# Patient Record
Sex: Male | Born: 1950 | Race: White | Hispanic: No | Marital: Single | Smoking: Former smoker
Health system: Southern US, Community
[De-identification: ages and names within clinical notes are randomized; demographics above are authoritative.]

## PROBLEM LIST (undated history)

## (undated) DIAGNOSIS — M4800 Spinal stenosis, site unspecified: Secondary | ICD-10-CM

## (undated) DIAGNOSIS — G629 Polyneuropathy, unspecified: Secondary | ICD-10-CM

## (undated) DIAGNOSIS — D696 Thrombocytopenia, unspecified: Secondary | ICD-10-CM

## (undated) DIAGNOSIS — G600 Hereditary motor and sensory neuropathy: Secondary | ICD-10-CM

## (undated) DIAGNOSIS — H9190 Unspecified hearing loss, unspecified ear: Secondary | ICD-10-CM

## (undated) DIAGNOSIS — M1990 Unspecified osteoarthritis, unspecified site: Secondary | ICD-10-CM

## (undated) DIAGNOSIS — N400 Enlarged prostate without lower urinary tract symptoms: Secondary | ICD-10-CM

## (undated) DIAGNOSIS — M21371 Foot drop, right foot: Secondary | ICD-10-CM

## (undated) DIAGNOSIS — I639 Cerebral infarction, unspecified: Secondary | ICD-10-CM

## (undated) HISTORY — DX: Unspecified hearing loss, unspecified ear: H91.90

## (undated) HISTORY — PX: FOOT ARTHROPLASTY: SHX1657

## (undated) HISTORY — PX: KNEE ARTHROSCOPY: SUR90

## (undated) HISTORY — PX: CERVICAL DISC SURGERY: SHX588

## (undated) HISTORY — DX: Cerebral infarction, unspecified: I63.9

## (undated) HISTORY — DX: Spinal stenosis, site unspecified: M48.00

## (undated) HISTORY — DX: Hereditary motor and sensory neuropathy: G60.0

---

## 2014-03-27 ENCOUNTER — Telehealth: Payer: Self-pay | Admitting: Family Medicine

## 2014-03-27 NOTE — Telephone Encounter (Signed)
Patient currently has [place] and is taking tamsulosin, diclofenac, and tizanadine. Patient just moved here from [place]. Patient was scheduled for 3/2 with [facility]. Patient notified to bring ins card, photo id, and medications.

## 2014-04-11 ENCOUNTER — Encounter (INDEPENDENT_AMBULATORY_CARE_PROVIDER_SITE_OTHER): Payer: Self-pay

## 2014-04-11 ENCOUNTER — Telehealth: Payer: Self-pay | Admitting: Family Medicine

## 2014-04-11 ENCOUNTER — Encounter: Payer: Self-pay | Admitting: Family Medicine

## 2014-04-11 ENCOUNTER — Ambulatory Visit (INDEPENDENT_AMBULATORY_CARE_PROVIDER_SITE_OTHER): Payer: 59 | Admitting: Family Medicine

## 2014-04-11 VITALS — BP 149/79 | HR 54 | Temp 96.6°F | Ht 69.0 in | Wt 184.0 lb

## 2014-04-11 DIAGNOSIS — G609 Hereditary and idiopathic neuropathy, unspecified: Secondary | ICD-10-CM

## 2014-04-11 DIAGNOSIS — M1990 Unspecified osteoarthritis, unspecified site: Secondary | ICD-10-CM | POA: Diagnosis not present

## 2014-04-11 DIAGNOSIS — R42 Dizziness and giddiness: Secondary | ICD-10-CM | POA: Diagnosis not present

## 2014-04-11 DIAGNOSIS — N400 Enlarged prostate without lower urinary tract symptoms: Secondary | ICD-10-CM

## 2014-04-11 MED ORDER — DICLOFENAC SODIUM 75 MG PO TBEC: 75.0000 mg | DELAYED_RELEASE_TABLET | Freq: Two times a day (BID) | ORAL | Status: DC

## 2014-04-11 MED ORDER — CYCLOBENZAPRINE HCL 10 MG PO TABS: 10.0000 mg | ORAL_TABLET | Freq: Every day | ORAL | Status: DC

## 2014-04-11 MED ORDER — TAMSULOSIN HCL 0.4 MG PO CAPS: 0.4000 mg | ORAL_CAPSULE | Freq: Every day | ORAL | Status: DC

## 2014-04-11 NOTE — Telephone Encounter (Signed)
Diclofenac did not transmit to [facility] - I called this in and pt aware

## 2014-04-11 NOTE — Patient Instructions (Signed)
DASH Eating Plan °DASH stands for "Dietary Approaches to Stop Hypertension." The DASH eating plan is a healthy eating plan that has been shown to reduce high blood pressure (hypertension). Additional health benefits may include reducing the risk of type 2 diabetes mellitus, heart disease, and stroke. The DASH eating plan may also help with weight loss. °WHAT DO I NEED TO KNOW ABOUT THE DASH EATING PLAN? °For the DASH eating plan, you will follow these general guidelines: °· Choose foods with a percent daily value for sodium of less than 5% (as listed on the food label). °· Use salt-free seasonings or herbs instead of table salt or sea salt. °· Check with your health care provider or pharmacist before using salt substitutes. °· Eat lower-sodium products, often labeled as "lower sodium" or "no salt added." °· Eat fresh foods. °· Eat more vegetables, fruits, and low-fat dairy products. °· Choose whole grains. Look for the word "whole" as the first word in the ingredient list. °· Choose fish and skinless chicken or turkey more often than red meat. Limit fish, poultry, and meat to 6 oz (170 g) each day. °· Limit sweets, desserts, sugars, and sugary drinks. °· Choose heart-healthy fats. °· Limit cheese to 1 oz (28 g) per day. °· Eat more home-cooked food and less restaurant, buffet, and fast food. °· Limit fried foods. °· Cook foods using methods other than frying. °· Limit canned vegetables. If you do use them, rinse them well to decrease the sodium. °· When eating at a restaurant, ask that your food be prepared with less salt, or no salt if possible. °WHAT FOODS CAN I EAT? °Seek help from a dietitian for individual calorie needs. °Grains °Whole grain or whole wheat bread. Brown rice. Whole grain or whole wheat pasta. Quinoa, bulgur, and whole grain cereals. Low-sodium cereals. Corn or whole wheat flour tortillas. Whole grain cornbread. Whole grain crackers. Low-sodium crackers. °Vegetables °Fresh or frozen vegetables  (raw, steamed, roasted, or grilled). Low-sodium or reduced-sodium tomato and vegetable juices. Low-sodium or reduced-sodium tomato sauce and paste. Low-sodium or reduced-sodium canned vegetables.  °Fruits °All fresh, canned (in natural juice), or frozen fruits. °Meat and Other Protein Products °Ground beef (85% or leaner), grass-fed beef, or beef trimmed of fat. Skinless chicken or turkey. Ground chicken or turkey. Pork trimmed of fat. All fish and seafood. Eggs. Dried beans, peas, or lentils. Unsalted nuts and seeds. Unsalted canned beans. °Dairy °Low-fat dairy products, such as skim or 1% milk, 2% or reduced-fat cheeses, low-fat ricotta or cottage cheese, or plain low-fat yogurt. Low-sodium or reduced-sodium cheeses. °Fats and Oils °Tub margarines without trans fats. Light or reduced-fat mayonnaise and salad dressings (reduced sodium). Avocado. Safflower, olive, or canola oils. Natural peanut or almond butter. °Other °Unsalted popcorn and pretzels. °The items listed above may not be a complete list of recommended foods or beverages. Contact your dietitian for more options. °WHAT FOODS ARE NOT RECOMMENDED? °Grains °White bread. White pasta. White rice. Refined cornbread. Bagels and croissants. Crackers that contain trans fat. °Vegetables °Creamed or fried vegetables. Vegetables in a cheese sauce. Regular canned vegetables. Regular canned tomato sauce and paste. Regular tomato and vegetable juices. °Fruits °Dried fruits. Canned fruit in light or heavy syrup. Fruit juice. °Meat and Other Protein Products °Fatty cuts of meat. Ribs, chicken wings, bacon, sausage, bologna, salami, chitterlings, fatback, hot dogs, bratwurst, and packaged luncheon meats. Salted nuts and seeds. Canned beans with salt. °Dairy °Whole or 2% milk, cream, half-and-half, and cream cheese. Whole-fat or sweetened yogurt. Full-fat   cheeses or blue cheese. Nondairy creamers and whipped toppings. Processed cheese, cheese spreads, or cheese  curds. °Condiments °Onion and garlic salt, seasoned salt, table salt, and sea salt. Canned and packaged gravies. Worcestershire sauce. Tartar sauce. Barbecue sauce. Teriyaki sauce. Soy sauce, including reduced sodium. Steak sauce. Fish sauce. Oyster sauce. Cocktail sauce. Horseradish. Ketchup and mustard. Meat flavorings and tenderizers. Bouillon cubes. Hot sauce. Tabasco sauce. Marinades. Taco seasonings. Relishes. °Fats and Oils °Butter, stick margarine, lard, shortening, ghee, and bacon fat. Coconut, palm kernel, or palm oils. Regular salad dressings. °Other °Pickles and olives. Salted popcorn and pretzels. °The items listed above may not be a complete list of foods and beverages to avoid. Contact your dietitian for more information. °WHERE CAN I FIND MORE INFORMATION? °National Heart, Lung, and [employer] Institute: www.nhlbi.nih.gov/health/health-topics/topics/dash/ °Document Released: 01/15/2011 Document Revised: 06/12/2013 Document Reviewed: 11/30/2012 °ExitCare® Patient Information ©2015 [employer]. This information is not intended to replace advice given to you by your health care provider. Make sure you discuss any questions you have with your health care provider. ° °

## 2014-04-11 NOTE — Progress Notes (Signed)
Subjective:  Patient ID: Treyden Hakim, male    DOB: April 20, 1950  Age: 64 y.o. MRN: [id number]  CC: Establish Care   HPI Maron Stanzione presents for *Mr. Coward is a new patient today who recently moved from [place] by way of [place]. 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 He had worked there for 40 years when the close down a year ago. He's been in [place] for about 2 months. He has a history of prostate problems for which he takes  tamsulosin. He has a problem with his right knee for which he takes the diclofenac. He has some other joint issues as well and stiffness in the mornings. For this he takes the tizanidine. At some point he's developed just a feeling of lightheadedness this persisted through much of the day. It is not associated with vertigo. It is mild enough that he is able to function but it is somewhat disorienting at times. It does not seem to occur when he's driving or when he is outdoors and active. It seems to be more common when he seated and when he is indoors.  History Edwen has no past medical history on file.   He has no past surgical history on file.   His family history is not on file.He reports that he quit smoking about 20 years ago. His smoking use included Cigarettes. He started smoking about 25 years ago. He smoked 0.50 packs per day. He does not have any smokeless tobacco history on file. He reports that he does not drink alcohol or use illicit drugs.  No current outpatient prescriptions on file prior to visit.   No current facility-administered medications on file prior to visit.    ROS Review of Systems  Constitutional: Negative for fever, chills, diaphoresis and unexpected weight change.  HENT: Negative for congestion, hearing loss, rhinorrhea, sore throat and trouble swallowing.   Respiratory: Negative for cough, chest tightness, shortness of  breath and wheezing.   Gastrointestinal: Negative for nausea, vomiting, abdominal pain, diarrhea, constipation and abdominal distention.  Endocrine: Negative for cold intolerance and heat intolerance.  Genitourinary: Positive for frequency. Negative for dysuria, urgency, hematuria, flank pain and decreased urine volume.  Musculoskeletal: Positive for joint swelling and arthralgias.  Skin: Negative for rash.  Neurological: Positive for dizziness and light-headedness. Negative for tremors, seizures, syncope, numbness and headaches.  Psychiatric/Behavioral: Negative for dysphoric mood, decreased concentration and agitation. The patient is not nervous/anxious.     Objective:  BP 149/79 mmHg  Pulse 54  Temp(Src) 96.6 F (35.9 C) (Oral)  Ht _0  (1.753 m)  Wt 184 lb (83.462 kg)  BMI 27.16 kg/m2  Physical Exam  Constitutional: He is oriented to person, place, and time. He appears well-developed and well-nourished.  HENT:  Head: Normocephalic and atraumatic.  Mouth/Throat: Oropharynx is clear and moist.  Eyes: EOM are normal. Pupils are equal, round, and reactive to light.  Neck: Normal range of motion. No tracheal deviation present. No thyromegaly present.  Cardiovascular: Normal rate, regular rhythm and normal heart sounds.  Exam reveals no gallop and no friction rub.   No murmur heard. Pulmonary/Chest: Breath sounds normal. He has no wheezes. He has no rales.  Abdominal: Soft. He exhibits no mass. There is no tenderness.  Musculoskeletal: Normal range of motion. He exhibits no edema.  Neurological: He is alert and oriented to person, place, and time.  Skin: Skin is  warm and dry.  Psychiatric: He has a normal mood and affect.    Assessment & Plan:   Rebekah was seen today for establish care.  Diagnoses and all orders for this visit:  Dizziness and giddiness Orders: -     CBC with Differential/Platelet -     Lipid panel -     CMP14+EGFR  BPH (benign prostatic  hyperplasia) Orders: -     Lipid panel  Arthritis Orders: -     Lipid panel  Hereditary and idiopathic peripheral neuropathy Orders: -     Lipid panel  Other orders -     tamsulosin (FLOMAX) 0.4 MG CAPS capsule; Take 1 capsule (0.4 mg total) by mouth daily. -     cyclobenzaprine (FLEXERIL) 10 MG tablet; Take 1 tablet (10 mg total) by mouth at bedtime. MAY INCREASE TO THREE A DAY AS NEEDED FOR STIFFNESS. -     diclofenac (VOLTAREN) 75 MG EC tablet; Take 1 tablet (75 mg total) by mouth 2 (two) times daily. Take with food.   I have discontinued Mr. Freilich's tiZANidine and Diclofenac Sodium CR. I have also changed his tamsulosin. Additionally, I am having him start on cyclobenzaprine and diclofenac. Lastly, I am having him maintain his CAVERJECT IMPULSE.  Meds ordered this encounter  Medications  . DISCONTD: tamsulosin (FLOMAX) 0.4 MG CAPS capsule    Sig: Take 1 capsule by mouth daily.    Refill:  0  . DISCONTD: tiZANidine (ZANAFLEX) 2 MG tablet    Sig: Take 1 tablet by mouth. One tablet every morning and at noon. Take 3 tablets at bedtime    Refill:  0  . DISCONTD: Diclofenac Sodium CR 100 MG 24 hr tablet    Sig: Take 1 tablet by mouth daily.    Refill:  0  . CAVERJECT IMPULSE 20 MCG injection    Sig:     Refill:  11  . tamsulosin (FLOMAX) 0.4 MG CAPS capsule    Sig: Take 1 capsule (0.4 mg total) by mouth daily.    Dispense:  30 capsule    Refill:  0  . cyclobenzaprine (FLEXERIL) 10 MG tablet    Sig: Take 1 tablet (10 mg total) by mouth at bedtime. MAY INCREASE TO THREE A DAY AS NEEDED FOR STIFFNESS.    Dispense:  90 tablet    Refill:  1  . diclofenac (VOLTAREN) 75 MG EC tablet    Sig: Take 1 tablet (75 mg total) by mouth 2 (two) times daily. Take with food.    Dispense:  180 tablet    Refill:  1      Follow-up: No Follow-up on file.  Claretta Fraise, M.D.

## 2014-04-12 LAB — CBC WITH DIFFERENTIAL/PLATELET
Basophils Absolute: 0 10*3/uL (ref 0.0–0.2)
Basos: 0 %
Eos: 2 %
Eosinophils Absolute: 0.1 10*3/uL (ref 0.0–0.4)
HCT: 42.8 % (ref 37.5–51.0)
Hemoglobin: 14.4 g/dL (ref 12.6–17.7)
Immature Grans (Abs): 0 10*3/uL (ref 0.0–0.1)
Immature Granulocytes: 0 %
Lymphocytes Absolute: 1.3 10*3/uL (ref 0.7–3.1)
Lymphs: 22 %
MCH: 28.8 pg (ref 26.6–33.0)
MCHC: 33.6 g/dL (ref 31.5–35.7)
MCV: 86 fL (ref 79–97)
Monocytes Absolute: 0.6 10*3/uL (ref 0.1–0.9)
Monocytes: 10 %
Neutrophils Absolute: 3.8 10*3/uL (ref 1.4–7.0)
Neutrophils Relative %: 66 %
Platelets: 107 10*3/uL — ABNORMAL LOW (ref 150–379)
RBC: 5 x10E6/uL (ref 4.14–5.80)
RDW: 14.4 % (ref 12.3–15.4)
WBC: 5.7 10*3/uL (ref 3.4–10.8)

## 2014-04-12 LAB — CMP14+EGFR
ALT: 26 IU/L (ref 0–44)
AST: 24 IU/L (ref 0–40)
Albumin/Globulin Ratio: 1.8 (ref 1.1–2.5)
Albumin: 4.4 g/dL (ref 3.6–4.8)
Alkaline Phosphatase: 85 IU/L (ref 39–117)
BUN/Creatinine Ratio: 17 (ref 10–22)
BUN: 14 mg/dL (ref 8–27)
Bilirubin Total: 0.8 mg/dL (ref 0.0–1.2)
CO2: 24 mmol/L (ref 18–29)
Calcium: 9.5 mg/dL (ref 8.6–10.2)
Chloride: 98 mmol/L (ref 97–108)
Creatinine, Ser: 0.81 mg/dL (ref 0.76–1.27)
GFR calc Af Amer: 111 mL/min/{1.73_m2} (ref >59)
GFR calc non Af Amer: 96 mL/min/{1.73_m2} (ref >59)
Globulin, Total: 2.4 g/dL (ref 1.5–4.5)
Glucose: 85 mg/dL (ref 65–99)
Potassium: 4.1 mmol/L (ref 3.5–5.2)
Sodium: 137 mmol/L (ref 134–144)
Total Protein: 6.8 g/dL (ref 6.0–8.5)

## 2014-04-12 LAB — LIPID PANEL
Chol/HDL Ratio: 3.4 ratio units (ref 0.0–5.0)
Cholesterol, Total: 202 mg/dL — ABNORMAL HIGH (ref 100–199)
HDL: 59 mg/dL (ref >39)
LDL Calculated: 127 mg/dL — ABNORMAL HIGH (ref 0–99)
Triglycerides: 82 mg/dL (ref 0–149)
VLDL Cholesterol Cal: 16 mg/dL (ref 5–40)

## 2014-04-13 ENCOUNTER — Telehealth: Payer: Self-pay | Admitting: Family Medicine

## 2014-04-13 ENCOUNTER — Encounter: Payer: Self-pay | Admitting: Family Medicine

## 2014-04-13 NOTE — Telephone Encounter (Signed)
-----   Message from Claretta Fraise, MD sent at 04/12/2014  6:04 PM EST ----- Helyn Numbers,    Your lab result is normal.Some minor variations that are not significant are commonly marked abnormal, but do not represent any medical problem for you.  Best regards, Claretta Fraise, M.D.

## 2014-04-13 NOTE — Telephone Encounter (Signed)
Patient aware and happy

## 2014-05-10 ENCOUNTER — Telehealth: Payer: Self-pay | Admitting: Family Medicine

## 2014-05-10 ENCOUNTER — Other Ambulatory Visit: Payer: Self-pay | Admitting: *Deleted

## 2014-05-10 MED ORDER — TAMSULOSIN HCL 0.4 MG PO CAPS: 0.4000 mg | ORAL_CAPSULE | Freq: Every day | ORAL | Status: DC

## 2014-05-10 NOTE — Telephone Encounter (Signed)
Left message on machine that meds sent to pharmacy

## 2014-05-14 ENCOUNTER — Ambulatory Visit (INDEPENDENT_AMBULATORY_CARE_PROVIDER_SITE_OTHER): Payer: 59 | Admitting: Family Medicine

## 2014-05-14 ENCOUNTER — Ambulatory Visit: Payer: 59 | Admitting: Family Medicine

## 2014-05-14 ENCOUNTER — Encounter: Payer: Self-pay | Admitting: Family Medicine

## 2014-05-14 VITALS — BP 125/76 | HR 64 | Temp 97.8°F | Ht 69.0 in | Wt 182.2 lb

## 2014-05-14 DIAGNOSIS — R350 Frequency of micturition: Secondary | ICD-10-CM | POA: Diagnosis not present

## 2014-05-14 MED ORDER — TAMSULOSIN HCL 0.4 MG PO CAPS: 0.8000 mg | ORAL_CAPSULE | Freq: Every day | ORAL | Status: DC

## 2014-05-14 NOTE — Progress Notes (Signed)
Subjective:  Patient ID: Philip Stone, male    DOB: 1950-12-18  Age: 64 y.o. MRN: [id number]  CC: Dizziness   HPI Philip Stone presents for urinary frequency and urgency not responding to tamsulosin. No nocturia. The dizziness he was experiencing at last visit has resolved completely. History Philip Stone has no past medical history on file.   He has no past surgical history on file.   His family history is not on file.He reports that he quit smoking about 20 years ago. His smoking use included Cigarettes. He started smoking about 25 years ago. He smoked 0.50 packs per day. He does not have any smokeless tobacco history on file. He reports that he does not drink alcohol or use illicit drugs.  Current Outpatient Prescriptions on File Prior to Visit  Medication Sig Dispense Refill  . CAVERJECT IMPULSE 20 MCG injection   11  . cyclobenzaprine (FLEXERIL) 10 MG tablet Take 1 tablet (10 mg total) by mouth at bedtime. MAY INCREASE TO THREE A DAY AS NEEDED FOR STIFFNESS. 90 tablet 1  . diclofenac (VOLTAREN) 75 MG EC tablet Take 1 tablet (75 mg total) by mouth 2 (two) times daily. Take with food. 180 tablet 1   No current facility-administered medications on file prior to visit.    ROS Review of Systems  Constitutional: Negative for fever, chills and diaphoresis.  Respiratory: Negative for cough and shortness of breath.   Cardiovascular: Negative for chest pain.  Gastrointestinal: Negative for nausea, vomiting, abdominal pain, diarrhea, constipation, blood in stool and abdominal distention.  Genitourinary: Negative for dysuria, hematuria and flank pain.  Musculoskeletal: Negative for joint swelling and arthralgias.  Skin: Negative for rash.  Neurological: Negative for dizziness and weakness.  Psychiatric/Behavioral: The patient is not nervous/anxious.     Objective:  BP 125/76 mmHg  Pulse 64  Temp(Src) 97.8 F (36.6 C) (Oral)  Ht 5\' 9"  (1.753 m)  Wt 182 lb 3.2 oz (82.645 kg)  BMI  26.89 kg/m2  BP Readings from Last 3 Encounters:  05/14/14 125/76  04/11/14 149/79    Wt Readings from Last 3 Encounters:  05/14/14 182 lb 3.2 oz (82.645 kg)  04/11/14 184 lb (83.462 kg)     Physical Exam  Constitutional: He appears well-developed and well-nourished.  HENT:  Head: Normocephalic and atraumatic.  Right Ear: Tympanic membrane and external ear normal. No decreased hearing is noted.  Left Ear: Tympanic membrane and external ear normal. No decreased hearing is noted.  Mouth/Throat: No oropharyngeal exudate or posterior oropharyngeal erythema.  Eyes: Pupils are equal, round, and reactive to light.  Neck: Normal range of motion. Neck supple.  Cardiovascular: Normal rate and regular rhythm.   No murmur heard. Pulmonary/Chest: Breath sounds normal. No respiratory distress.  Abdominal: Soft. Bowel sounds are normal. He exhibits no mass. There is no tenderness.  Vitals reviewed.   No results found for: HGBA1C  Lab Results  Component Value Date   WBC 5.7 04/11/2014   HGB 14.4 04/11/2014   HCT 42.8 04/11/2014   PLT 107* 04/11/2014   GLUCOSE 85 04/11/2014   CHOL 202* 04/11/2014   TRIG 82 04/11/2014   HDL 59 04/11/2014   LDLCALC 127* 04/11/2014   ALT 26 04/11/2014   AST 24 04/11/2014   NA 137 04/11/2014   K 4.1 04/11/2014   CL 98 04/11/2014   CREATININE 0.81 04/11/2014   BUN 14 04/11/2014   CO2 24 04/11/2014    Patient was never admitted.  Assessment & Plan:   Philip Stone  was seen today for dizziness.  Diagnoses and all orders for this visit:  Urinary frequency Orders: -     ACTH -     PSA  Other orders -     tamsulosin (FLOMAX) 0.4 MG CAPS capsule; Take 2 capsules (0.8 mg total) by mouth at bedtime.   I have changed Mr. Waring's tamsulosin. I am also having him maintain his CAVERJECT IMPULSE, cyclobenzaprine, and diclofenac.  Meds ordered this encounter  Medications  . DISCONTD: cyclobenzaprine (FLEXERIL) 10 MG tablet    Sig: Take 1 tablet by  mouth 3 (three) times daily.  . tamsulosin (FLOMAX) 0.4 MG CAPS capsule    Sig: Take 2 capsules (0.8 mg total) by mouth at bedtime.    Dispense:  60 capsule    Refill:  2     Follow-up: Return in about 3 months (around 08/13/2014).  Claretta Fraise, M.D.

## 2014-05-15 LAB — ACTH: ACTH: 30.8 pg/mL (ref 7.2–63.3)

## 2014-05-15 LAB — PSA: PSA: 7.6 ng/mL — ABNORMAL HIGH (ref 0.0–4.0)

## 2014-05-17 ENCOUNTER — Telehealth: Payer: Self-pay | Admitting: *Deleted

## 2014-05-17 NOTE — Telephone Encounter (Signed)
-----   Message from Claretta Fraise, MD sent at 05/15/2014  5:42 PM EDT ----- Helyn Numbers,    Your lab result is normal.Some minor variations that are not significant are commonly marked abnormal, but do not represent any medical problem for you.  Best regards, Claretta Fraise, M.D.

## 2014-05-17 NOTE — Telephone Encounter (Signed)
Pt notified of results

## 2014-05-18 ENCOUNTER — Ambulatory Visit (INDEPENDENT_AMBULATORY_CARE_PROVIDER_SITE_OTHER): Payer: 59 | Admitting: Family Medicine

## 2014-05-18 ENCOUNTER — Encounter: Payer: Self-pay | Admitting: Family Medicine

## 2014-05-18 VITALS — BP 148/73 | HR 70 | Temp 98.2°F | Ht 69.0 in | Wt 185.4 lb

## 2014-05-18 DIAGNOSIS — K4090 Unilateral inguinal hernia, without obstruction or gangrene, not specified as recurrent: Secondary | ICD-10-CM

## 2014-05-18 NOTE — Progress Notes (Signed)
Subjective:  Patient ID: Philip Stone, male    DOB: 28-Oct-1950  Age: 64 y.o. MRN: [id number]  CC: Abdominal Pain   HPI Philip Stone presents for right inguinal mass. It is in the location on the right similar to where he had a hernia on the left many years ago. It is not painful but it is bulging and somewhat sore at times. He has no nausea vomiting diarrhea or abdominal pain. It does seem to be bigger at times than others but always present.  History Philip Stone has no past medical history on file.   He has no past surgical history on file.   His family history is not on file.He reports that he quit smoking about 20 years ago. His smoking use included Cigarettes. He started smoking about 25 years ago. He smoked 0.50 packs per day. He does not have any smokeless tobacco history on file. He reports that he does not drink alcohol or use illicit drugs.  Current Outpatient Prescriptions on File Prior to Visit  Medication Sig Dispense Refill  . CAVERJECT IMPULSE 20 MCG injection   11  . cyclobenzaprine (FLEXERIL) 10 MG tablet Take 1 tablet (10 mg total) by mouth at bedtime. MAY INCREASE TO THREE A DAY AS NEEDED FOR STIFFNESS. 90 tablet 1  . diclofenac (VOLTAREN) 75 MG EC tablet Take 1 tablet (75 mg total) by mouth 2 (two) times daily. Take with food. 180 tablet 1  . tamsulosin (FLOMAX) 0.4 MG CAPS capsule Take 2 capsules (0.8 mg total) by mouth at bedtime. 60 capsule 2   No current facility-administered medications on file prior to visit.    ROS Review of Systems  Constitutional: Negative for fever, chills, diaphoresis, fatigue and unexpected weight change.  HENT: Negative.   Cardiovascular: Negative.   Gastrointestinal: Negative.     Objective:  BP 148/73 mmHg  Pulse 70  Temp(Src) 98.2 F (36.8 C) (Oral)  Ht 5\' 9"  (1.753 m)  Wt 185 lb 6.4 oz (84.097 kg)  BMI 27.37 kg/m2  BP Readings from Last 3 Encounters:  05/18/14 148/73  05/14/14 125/76  04/11/14 149/79    Wt Readings  from Last 3 Encounters:  05/18/14 185 lb 6.4 oz (84.097 kg)  05/14/14 182 lb 3.2 oz (82.645 kg)  04/11/14 184 lb (83.462 kg)     Physical Exam  Constitutional: He appears well-developed and well-nourished.  HENT:  Head: Normocephalic and atraumatic.  Right Ear: Tympanic membrane normal. No decreased hearing is noted.  Left Ear: Tympanic membrane normal. No decreased hearing is noted.  Mouth/Throat: No oropharyngeal exudate or posterior oropharyngeal erythema.  Cardiovascular: Normal rate and regular rhythm.   No murmur heard. Pulmonary/Chest: Breath sounds normal. No respiratory distress.  Abdominal: Soft. Bowel sounds are normal. He exhibits mass (there is a bulging 3 cm mass at the right inguinal ring. Palpation reveals a hernia bulging through the inguinal ring and extending along the cord into the upper right scrotum.). There is no tenderness.  Vitals reviewed.   No results found for: HGBA1C  Lab Results  Component Value Date   WBC 5.7 04/11/2014   HGB 14.4 04/11/2014   HCT 42.8 04/11/2014   PLT 107* 04/11/2014   GLUCOSE 85 04/11/2014   CHOL 202* 04/11/2014   TRIG 82 04/11/2014   HDL 59 04/11/2014   LDLCALC 127* 04/11/2014   ALT 26 04/11/2014   AST 24 04/11/2014   NA 137 04/11/2014   K 4.1 04/11/2014   CL 98 04/11/2014   CREATININE 0.81  04/11/2014   BUN 14 04/11/2014   CO2 24 04/11/2014   PSA 7.6* 05/14/2014    Patient was never admitted.  Assessment & Plan:   Philip Stone was seen today for abdominal pain.  Diagnoses and all orders for this visit:  Right inguinal hernia Orders: -     Ambulatory referral to General Surgery  I am having Philip Stone maintain his CAVERJECT IMPULSE, cyclobenzaprine, diclofenac, and tamsulosin.  No orders of the defined types were placed in this encounter.     Follow-up: Return if symptoms worsen or fail to improve. And as previously discussed at most recent visit.Claretta Fraise, M.D.

## 2014-05-22 LAB — SPECIMEN STATUS REPORT

## 2014-05-28 ENCOUNTER — Telehealth: Payer: Self-pay | Admitting: Family Medicine

## 2014-05-28 NOTE — Telephone Encounter (Signed)
LM this was done 05/14/14

## 2014-06-01 ENCOUNTER — Telehealth: Payer: Self-pay | Admitting: Family Medicine

## 2014-06-08 NOTE — H&P (Signed)
  [facility] SOAP Note  Vital Signs:  Vitals as of: 7/93/9030: Systolic 092: Diastolic 76: Heart Rate 58: Temp 97.47F: Height 69ft 10in: Weight 184Lbs 0 Ounces: Pain Level 1: BMI 26.4  BMI : 26.4 kg/m2  Subjective: This 64 year old male presents for of a right inguinal hernia.  Started swelling last month.  Made worse with straining.  No nasuea, vomiting.  Review of Symptoms:  Constitutional:unremarkable   Head:unremarkable Eyes:unremarkable   Nose/Mouth/Throat:unremarkable Cardiovascular:  unremarkable Respiratory:unremarkable Gastrointestinheartburn Genitourinary:frequency Musculoskeletal:unremarkable Skin:unremarkable Hematolgic/Lymphatic:unremarkable   Allergic/Immunologic:unremarkable   Past Medical History:  Reviewed  Past Medical History  Surgical History: none Medical Problems: pain Allergies: asa Medications: diclofenac, tamsulosin, cyclobenzaprine   Social History:Reviewed  Social History  Preferred Language: English Race:  White Ethnicity: Not Hispanic / Latino Age: 64 year Marital Status:  S Alcohol: no   Smoking Status: Never smoker reviewed on 06/05/2014 Functional Status reviewed on 06/05/2014 ------------------------------------------------ Bathing: Normal Cooking: Normal Dressing: Normal Driving: Normal Eating: Normal Managing Meds: Normal Oral Care: Normal Shopping: Normal Toileting: Normal Transferring: Normal Walking: Normal Cognitive Status reviewed on 06/05/2014 ------------------------------------------------ Attention: Normal Decision Making: Normal Language: Normal Memory: Normal Motor: Normal Perception: Normal Problem Solving: Normal Visual and Spatial: Normal   Family History:Reviewed  Family Health History Mother, Deceased; Healthy;  Father, Deceased; Healthy;     Objective Information: General:Well appearing, well nourished in no distress. Heart:RRR, no murmur Lungs:  CTA bilaterally,  no wheezes, rhonchi, rales.  Breathing unlabored. Abdomen:Soft, NT/ND, no HSM, no masses.  Easily reducible right inguinal hernia.  Assessment:Right inguinal hernia  Diagnoses: 550.90  K40.90 Inguinal hernia (Unilateral inguinal hernia, without obstruction or gangrene, not specified as recurrent)  Procedures: 33007 - OFFICE OUTPATIENT NEW 30 MINUTES    Plan:  Will call to schedule right inguinal herniorrhapy with mesh.   Patient Education:Alternative treatments to surgery were discussed with patient (and family).  Risks and benefits  of procedure including bleeding, infection, mesh use, and recurrence of the hernia were fully explained to the patient (and family) who gave informed consent. Patient/family questions were addressed.  Follow-up:PRN

## 2014-06-11 NOTE — Patient Instructions (Signed)
Philip Stone  06/11/2014   Your procedure is scheduled on:   06/15/2014  Report to [facility] at  34 AM.  Call this number if you have problems the morning of surgery: [phone]   Remember:   Do not eat food or drink liquids after midnight.   Take these medicines the morning of surgery with A SIP OF WATER:  Flexaril, voltaren   Do not wear jewelry, make-up or nail polish.  Do not wear lotions, powders, or perfumes.   Do not shave 48 hours prior to surgery. Men may shave face and neck.  Do not bring valuables to the hospital.  [facility] is not responsible for any belongings or valuables.               Contacts, dentures or bridgework may not be worn into surgery.  Leave suitcase in the car. After surgery it may be brought to your room.  For patients admitted to the hospital, discharge time is determined by your treatment team.               Patients discharged the day of surgery will not be allowed to drive home.  Name and phone number of your driver: family  Special Instructions: Shower using CHG 2 nights before surgery and the night before surgery.  If you shower the day of surgery use CHG.  Use special wash - you have one bottle of CHG for all showers.  You should use approximately 1/3 of the bottle for each shower.   Please read over the following fact sheets that you were given: Pain Booklet, Coughing and Deep Breathing, Surgical Site Infection Prevention, Anesthesia Post-op Instructions and Care and Recovery After Surgery Hernia A hernia occurs when an internal organ pushes out through a weak spot in the abdominal wall. Hernias most commonly occur in the groin and around the navel. Hernias often can be pushed back into place (reduced). Most hernias tend to get worse over time. Some abdominal hernias can get stuck in the opening (irreducible or incarcerated hernia) and cannot be reduced. An irreducible abdominal hernia which is tightly squeezed into the opening is at risk for  impaired blood supply (strangulated hernia). A strangulated hernia is a medical emergency. Because of the risk for an irreducible or strangulated hernia, surgery may be recommended to repair a hernia. CAUSES   Heavy lifting.  Prolonged coughing.  Straining to have a bowel movement.  A cut (incision) made during an abdominal surgery. HOME CARE INSTRUCTIONS   Bed rest is not required. You may continue your normal activities.  Avoid lifting more than 10 pounds (4.5 kg) or straining.  Cough gently. If you are a smoker it is best to stop. Even the best hernia repair can break down with the continual strain of coughing. Even if you do not have your hernia repaired, a cough will continue to aggravate the problem.  Do not wear anything tight over your hernia. Do not try to keep it in with an outside bandage or truss. These can damage abdominal contents if they are trapped within the hernia sac.  Eat a normal diet.  Avoid constipation. Straining over long periods of time will increase hernia size and encourage breakdown of repairs. If you cannot do this with diet alone, stool softeners may be used. SEEK IMMEDIATE MEDICAL CARE IF:   You have a fever.  You develop increasing abdominal pain.  You feel nauseous or vomit.  Your hernia is stuck  outside the abdomen, looks discolored, feels hard, or is tender.  You have any changes in your bowel habits or in the hernia that are unusual for you.  You have increased pain or swelling around the hernia.  You cannot push the hernia back in place by applying gentle pressure while lying down. MAKE SURE YOU:   Understand these instructions.  Will watch your condition.  Will get help right away if you are not doing well or get worse. Document Released: 01/26/2005 Document Revised: 04/20/2011 Document Reviewed: 09/15/2007 [facility] Patient Information 2015 [place], [place]. This information is not intended to replace advice given to you by your  health care provider. Make sure you discuss any questions you have with your health care provider. PATIENT INSTRUCTIONS POST-ANESTHESIA  IMMEDIATELY FOLLOWING SURGERY:  Do not drive or operate machinery for the first twenty four hours after surgery.  Do not make any important decisions for twenty four hours after surgery or while taking narcotic pain medications or sedatives.  If you develop intractable nausea and vomiting or a severe headache please notify your doctor immediately.  FOLLOW-UP:  Please make an appointment with your surgeon as instructed. You do not need to follow up with anesthesia unless specifically instructed to do so.  WOUND CARE INSTRUCTIONS (if applicable):  Keep a dry clean dressing on the anesthesia/puncture wound site if there is drainage.  Once the wound has quit draining you may leave it open to air.  Generally you should leave the bandage intact for twenty four hours unless there is drainage.  If the epidural site drains for more than 36-48 hours please call the anesthesia department.  QUESTIONS?:  Please feel free to call your physician or the hospital operator if you have any questions, and they will be happy to assist you.

## 2014-06-12 ENCOUNTER — Encounter (HOSPITAL_COMMUNITY)
Admission: RE | Admit: 2014-06-12 | Discharge: 2014-06-12 | Disposition: A | Discharge status: Home / Self Care | Payer: 59 | Source: Ambulatory Visit | Attending: General Surgery | Admitting: General Surgery

## 2014-06-12 ENCOUNTER — Other Ambulatory Visit: Payer: Self-pay

## 2014-06-12 ENCOUNTER — Encounter (HOSPITAL_COMMUNITY): Payer: Self-pay

## 2014-06-12 DIAGNOSIS — D176 Benign lipomatous neoplasm of spermatic cord: Secondary | ICD-10-CM | POA: Diagnosis not present

## 2014-06-12 DIAGNOSIS — Z87891 Personal history of nicotine dependence: Secondary | ICD-10-CM | POA: Diagnosis not present

## 2014-06-12 DIAGNOSIS — K4090 Unilateral inguinal hernia, without obstruction or gangrene, not specified as recurrent: Secondary | ICD-10-CM | POA: Diagnosis present

## 2014-06-12 DIAGNOSIS — Z79899 Other long term (current) drug therapy: Secondary | ICD-10-CM | POA: Diagnosis not present

## 2014-06-12 DIAGNOSIS — G629 Polyneuropathy, unspecified: Secondary | ICD-10-CM | POA: Diagnosis not present

## 2014-06-12 DIAGNOSIS — Z791 Long term (current) use of non-steroidal anti-inflammatories (NSAID): Secondary | ICD-10-CM | POA: Diagnosis not present

## 2014-06-12 DIAGNOSIS — M1990 Unspecified osteoarthritis, unspecified site: Secondary | ICD-10-CM | POA: Diagnosis not present

## 2014-06-12 HISTORY — DX: Foot drop, right foot: M21.371

## 2014-06-12 HISTORY — DX: Unspecified osteoarthritis, unspecified site: M19.90

## 2014-06-12 HISTORY — DX: Benign prostatic hyperplasia without lower urinary tract symptoms: N40.0

## 2014-06-12 HISTORY — DX: Thrombocytopenia, unspecified: D69.6

## 2014-06-12 HISTORY — DX: Polyneuropathy, unspecified: G62.9

## 2014-06-12 LAB — BASIC METABOLIC PANEL
Anion gap: 6 (ref 5–15)
BUN: 15 mg/dL (ref 6–20)
CO2: 27 mmol/L (ref 22–32)
Calcium: 8.8 mg/dL — ABNORMAL LOW (ref 8.9–10.3)
Chloride: 107 mmol/L (ref 101–111)
Creatinine, Ser: 0.95 mg/dL (ref 0.61–1.24)
GFR calc Af Amer: >60 mL/min (ref >60)
GFR calc non Af Amer: >60 mL/min (ref >60)
Glucose, Bld: 98 mg/dL (ref 70–99)
Potassium: 4 mmol/L (ref 3.5–5.1)
Sodium: 140 mmol/L (ref 135–145)

## 2014-06-12 LAB — CBC WITH DIFFERENTIAL/PLATELET
Basophils Absolute: 0 10*3/uL (ref 0.0–0.1)
Basophils Relative: 0 % (ref 0–1)
Eosinophils Absolute: 0.1 10*3/uL (ref 0.0–0.7)
Eosinophils Relative: 3 % (ref 0–5)
HCT: 41 % (ref 39.0–52.0)
Hemoglobin: 14 g/dL (ref 13.0–17.0)
Lymphocytes Relative: 27 % (ref 12–46)
Lymphs Abs: 1.1 10*3/uL (ref 0.7–4.0)
MCH: 29.5 pg (ref 26.0–34.0)
MCHC: 34.1 g/dL (ref 30.0–36.0)
MCV: 86.3 fL (ref 78.0–100.0)
Monocytes Absolute: 0.6 10*3/uL (ref 0.1–1.0)
Monocytes Relative: 14 % — ABNORMAL HIGH (ref 3–12)
Neutro Abs: 2.3 10*3/uL (ref 1.7–7.7)
Neutrophils Relative %: 56 % (ref 43–77)
Platelets: 103 10*3/uL — ABNORMAL LOW (ref 150–400)
RBC: 4.75 MIL/uL (ref 4.22–5.81)
RDW: 13.5 % (ref 11.5–15.5)
Smear Review: DECREASED
WBC: 4.1 10*3/uL (ref 4.0–10.5)

## 2014-06-12 NOTE — Pre-Procedure Instructions (Signed)
Patient given information to sign up for my chart at home. 

## 2014-06-15 ENCOUNTER — Ambulatory Visit (HOSPITAL_COMMUNITY): Payer: 59 | Admitting: Anesthesiology

## 2014-06-15 ENCOUNTER — Ambulatory Visit (HOSPITAL_COMMUNITY)
Admission: RE | Admit: 2014-06-15 | Discharge: 2014-06-15 | Disposition: A | Discharge status: Home / Self Care | Payer: 59 | Source: Ambulatory Visit | Attending: General Surgery | Admitting: General Surgery

## 2014-06-15 ENCOUNTER — Encounter (HOSPITAL_COMMUNITY)
Admission: RE | Disposition: A | Discharge status: Home / Self Care | Payer: Self-pay | Source: Ambulatory Visit | Attending: General Surgery

## 2014-06-15 ENCOUNTER — Encounter (HOSPITAL_COMMUNITY): Payer: Self-pay | Admitting: Emergency Medicine

## 2014-06-15 DIAGNOSIS — M1990 Unspecified osteoarthritis, unspecified site: Secondary | ICD-10-CM | POA: Insufficient documentation

## 2014-06-15 DIAGNOSIS — K4090 Unilateral inguinal hernia, without obstruction or gangrene, not specified as recurrent: Secondary | ICD-10-CM | POA: Diagnosis not present

## 2014-06-15 DIAGNOSIS — Z79899 Other long term (current) drug therapy: Secondary | ICD-10-CM | POA: Insufficient documentation

## 2014-06-15 DIAGNOSIS — Z87891 Personal history of nicotine dependence: Secondary | ICD-10-CM | POA: Insufficient documentation

## 2014-06-15 DIAGNOSIS — D176 Benign lipomatous neoplasm of spermatic cord: Secondary | ICD-10-CM | POA: Insufficient documentation

## 2014-06-15 DIAGNOSIS — G629 Polyneuropathy, unspecified: Secondary | ICD-10-CM | POA: Insufficient documentation

## 2014-06-15 DIAGNOSIS — Z791 Long term (current) use of non-steroidal anti-inflammatories (NSAID): Secondary | ICD-10-CM | POA: Insufficient documentation

## 2014-06-15 HISTORY — PX: INGUINAL HERNIA REPAIR: SHX194

## 2014-06-15 HISTORY — PX: INSERTION OF MESH: SHX5868

## 2014-06-15 SURGERY — REPAIR, HERNIA, INGUINAL, ADULT
Anesthesia: General | Laterality: Right

## 2014-06-15 MED ORDER — MIDAZOLAM HCL 2 MG/2ML IJ SOLN
INTRAMUSCULAR | Status: AC
  Filled 2014-06-15: qty 2

## 2014-06-15 MED ORDER — MIDAZOLAM HCL 5 MG/5ML IJ SOLN
INTRAMUSCULAR | Status: DC | PRN
  Administered 2014-06-15: 2 mg via INTRAVENOUS

## 2014-06-15 MED ORDER — CHLORHEXIDINE GLUCONATE 4 % EX LIQD: 1.0000 "application " | Freq: Once | CUTANEOUS | Status: DC

## 2014-06-15 MED ORDER — MIDAZOLAM HCL 2 MG/2ML IJ SOLN
1.0000 mg | INTRAMUSCULAR | Status: DC | PRN
  Administered 2014-06-15: 2 mg via INTRAVENOUS

## 2014-06-15 MED ORDER — PROPOFOL 10 MG/ML IV BOLUS
INTRAVENOUS | Status: DC | PRN
  Administered 2014-06-15: 150 mg via INTRAVENOUS
  Administered 2014-06-15: 20 mg via INTRAVENOUS

## 2014-06-15 MED ORDER — BUPIVACAINE LIPOSOME 1.3 % IJ SUSP
INTRAMUSCULAR | Status: AC
  Filled 2014-06-15: qty 20

## 2014-06-15 MED ORDER — LIDOCAINE HCL 1 % IJ SOLN
INTRAMUSCULAR | Status: DC | PRN
  Administered 2014-06-15: 30 mg via INTRADERMAL

## 2014-06-15 MED ORDER — ONDANSETRON HCL 4 MG/2ML IJ SOLN
4.0000 mg | Freq: Once | INTRAMUSCULAR | Status: DC | PRN
Start: 2014-06-15 — End: 2014-06-15

## 2014-06-15 MED ORDER — ONDANSETRON HCL 4 MG/2ML IJ SOLN
INTRAMUSCULAR | Status: AC
  Filled 2014-06-15: qty 2

## 2014-06-15 MED ORDER — OXYCODONE-ACETAMINOPHEN 7.5-325 MG PO TABS: 1.0000 | ORAL_TABLET | ORAL | Status: DC | PRN

## 2014-06-15 MED ORDER — BUPIVACAINE LIPOSOME 1.3 % IJ SUSP
INTRAMUSCULAR | Status: DC | PRN
  Administered 2014-06-15: 20 mL

## 2014-06-15 MED ORDER — KETOROLAC TROMETHAMINE 30 MG/ML IJ SOLN
INTRAMUSCULAR | Status: AC
Start: 2014-06-15 — End: 2014-06-15
  Filled 2014-06-15: qty 1

## 2014-06-15 MED ORDER — ONDANSETRON HCL 4 MG/2ML IJ SOLN
4.0000 mg | Freq: Once | INTRAMUSCULAR | Status: AC
  Administered 2014-06-15: 4 mg via INTRAVENOUS

## 2014-06-15 MED ORDER — CEFAZOLIN SODIUM-DEXTROSE 2-3 GM-% IV SOLR
INTRAVENOUS | Status: AC
  Filled 2014-06-15: qty 50

## 2014-06-15 MED ORDER — FENTANYL CITRATE (PF) 100 MCG/2ML IJ SOLN
25.0000 ug | INTRAMUSCULAR | Status: AC
  Administered 2014-06-15 (×2): 25 ug via INTRAVENOUS

## 2014-06-15 MED ORDER — FENTANYL CITRATE (PF) 100 MCG/2ML IJ SOLN
INTRAMUSCULAR | Status: AC
  Filled 2014-06-15: qty 2

## 2014-06-15 MED ORDER — FENTANYL CITRATE (PF) 100 MCG/2ML IJ SOLN
25.0000 ug | INTRAMUSCULAR | Status: DC | PRN
  Administered 2014-06-15 (×2): 50 ug via INTRAVENOUS

## 2014-06-15 MED ORDER — FENTANYL CITRATE (PF) 100 MCG/2ML IJ SOLN
INTRAMUSCULAR | Status: DC | PRN
  Administered 2014-06-15 (×5): 50 ug via INTRAVENOUS

## 2014-06-15 MED ORDER — SODIUM CHLORIDE 0.9 % IR SOLN
Status: DC | PRN
  Administered 2014-06-15: 1000 mL

## 2014-06-15 MED ORDER — LACTATED RINGERS IV SOLN
INTRAVENOUS | Status: DC
  Administered 2014-06-15: 09:00:00 via INTRAVENOUS

## 2014-06-15 MED ORDER — FENTANYL CITRATE (PF) 250 MCG/5ML IJ SOLN
INTRAMUSCULAR | Status: AC
  Filled 2014-06-15: qty 5

## 2014-06-15 MED ORDER — PROPOFOL 10 MG/ML IV BOLUS
INTRAVENOUS | Status: AC
Start: 2014-06-15 — End: 2014-06-15
  Filled 2014-06-15: qty 20

## 2014-06-15 MED ORDER — CEFAZOLIN SODIUM-DEXTROSE 2-3 GM-% IV SOLR
2.0000 g | INTRAVENOUS | Status: AC
  Administered 2014-06-15: 2 g via INTRAVENOUS

## 2014-06-15 SURGICAL SUPPLY — 34 items
BAG HAMPER (MISCELLANEOUS) ×3 IMPLANT
CLOTH BEACON ORANGE TIMEOUT ST (SAFETY) ×3 IMPLANT
COVER LIGHT HANDLE STERIS (MISCELLANEOUS) ×6 IMPLANT
DRAIN PENROSE 18X1/2 LTX STRL (DRAIN) ×3 IMPLANT
ELECT REM PT RETURN 9FT ADLT (ELECTROSURGICAL) ×3
ELECTRODE REM PT RTRN 9FT ADLT (ELECTROSURGICAL) ×1 IMPLANT
GLOVE BIOGEL PI IND STRL 7.5 (GLOVE) ×1 IMPLANT
GLOVE BIOGEL PI INDICATOR 7.5 (GLOVE) ×2
GLOVE SURG SS PI 7.5 STRL IVOR (GLOVE) ×6 IMPLANT
GOWN STRL REUS W/ TWL XL LVL3 (GOWN DISPOSABLE) ×1 IMPLANT
GOWN STRL REUS W/TWL LRG LVL3 (GOWN DISPOSABLE) ×9 IMPLANT
GOWN STRL REUS W/TWL XL LVL3 (GOWN DISPOSABLE) ×2
INST SET MINOR GENERAL (KITS) ×3 IMPLANT
KIT ROOM TURNOVER APOR (KITS) ×3 IMPLANT
LIQUID BAND (GAUZE/BANDAGES/DRESSINGS) ×3 IMPLANT
MANIFOLD NEPTUNE II (INSTRUMENTS) ×3 IMPLANT
MESH HERNIA 1.6X1.9 PLUG LRG (Mesh General) ×1 IMPLANT
MESH HERNIA PLUG LRG (Mesh General) ×2 IMPLANT
MESH MARLEX PLUG MEDIUM (Mesh General) ×3 IMPLANT
NEEDLE HYPO 21X1.5 SAFETY (NEEDLE) ×3 IMPLANT
NS IRRIG 1000ML POUR BTL (IV SOLUTION) ×3 IMPLANT
PACK MINOR (CUSTOM PROCEDURE TRAY) ×3 IMPLANT
PAD ARMBOARD 7.5X6 YLW CONV (MISCELLANEOUS) ×3 IMPLANT
SCRUB PCMX 4 OZ (MISCELLANEOUS) ×3 IMPLANT
SET BASIN LINEN APH (SET/KITS/TRAYS/PACK) ×3 IMPLANT
SUT NOVA NAB GS-22 2 2-0 T-19 (SUTURE) ×9 IMPLANT
SUT NOVAFIL NAB HGS22 2-0 30IN (SUTURE) IMPLANT
SUT VIC AB 2-0 CT1 27 (SUTURE) ×2
SUT VIC AB 2-0 CT1 TAPERPNT 27 (SUTURE) ×1 IMPLANT
SUT VIC AB 3-0 SH 27 (SUTURE) ×2
SUT VIC AB 3-0 SH 27X BRD (SUTURE) ×1 IMPLANT
SUT VIC AB 4-0 PS2 27 (SUTURE) ×3 IMPLANT
SUT VICRYL AB 3 0 TIES (SUTURE) ×3 IMPLANT
SYR 20CC LL (SYRINGE) ×3 IMPLANT

## 2014-06-15 NOTE — Interval H&P Note (Signed)
History and Physical Interval Note:  06/15/2014 8:39 AM  Philip Stone  has presented today for surgery, with the diagnosis of right inguinal hernia  The various methods of treatment have been discussed with the patient and family. After consideration of risks, benefits and other options for treatment, the patient has consented to  Procedure(s): HERNIA REPAIR INGUINAL ADULT WITH MESH (Right) as a surgical intervention .  The patient's history has been reviewed, patient examined, no change in status, stable for surgery.  I have reviewed the patient's chart and labs.  Questions were answered to the patient's satisfaction.     Aviva Signs A

## 2014-06-15 NOTE — Anesthesia Postprocedure Evaluation (Signed)
  Anesthesia Post-op Note  Patient: Philip Stone  Procedure(s) Performed: Procedure(s): RIGHT INGUINAL HERNIORRHAPHY  WITH MESH (Right) INSERTION OF MESH RIGHT INGUINAL HERNIORRHAPHY (Right)  Patient Location: PACU  Anesthesia Type:General  Level of Consciousness: awake, alert , oriented and patient cooperative  Airway and Oxygen Therapy: Patient Spontanous Breathing  Post-op Pain: moderate  Post-op Assessment: Post-op Vital signs reviewed, Patient's Cardiovascular Status Stable, Respiratory Function Stable, Patent Airway, No signs of Nausea or vomiting and Pain level controlled  Post-op Vital Signs: Reviewed and stable  Last Vitals:  Filed Vitals:   06/15/14 1103  BP:   Pulse: 54  Temp:   Resp: 10    Complications: No apparent anesthesia complications

## 2014-06-15 NOTE — Discharge Instructions (Signed)
Inguinal Hernia, Adult  °Care After °Refer to this sheet in the next few weeks. These discharge instructions provide you with general information on caring for yourself after you leave the hospital. Your caregiver may also give you specific instructions. Your treatment has been planned according to the most current medical practices available, but unavoidable complications sometimes occur. If you have any problems or questions after discharge, please call your caregiver. °HOME CARE INSTRUCTIONS °· Put ice on the operative site. °¨ Put ice in a plastic bag. °¨ Place a towel between your skin and the bag. °¨ Leave the ice on for 15-20 minutes at a time, 03-04 times a day while awake. °· Change bandages (dressings) as directed. °· Keep the wound dry and clean. The wound may be washed gently with soap and water. Gently blot or dab the wound dry. It is okay to take showers 24 to 48 hours after surgery. Do not take baths, use swimming pools, or use hot tubs for 10 days, or as directed by your caregiver. °· Only take over-the-counter or prescription medicines for pain, discomfort, or fever as directed by your caregiver. °· Continue your normal diet as directed. °· Do not lift anything more than 10 pounds or play contact sports for 3 weeks, or as directed. °SEEK MEDICAL CARE IF: °· There is redness, swelling, or increasing pain in the wound. °· There is fluid (pus) coming from the wound. °· There is drainage from a wound lasting longer than 1 day. °· You have an oral temperature above 102° F (38.9° C). °· You notice a bad smell coming from the wound or dressing. °· The wound breaks open after the stitches (sutures) have been removed. °· You notice increasing pain in the shoulders (shoulder strap areas). °· You develop dizzy episodes or fainting while standing. °· You feel sick to your stomach (nauseous) or throw up (vomit). °SEEK IMMEDIATE MEDICAL CARE IF: °· You develop a rash. °· You have difficulty breathing. °· You  develop a reaction or have side effects to medicines you were given. °MAKE SURE YOU:  °· Understand these instructions. °· Will watch your condition. °· Will get help right away if you are not doing well or get worse. °Document Released: 02/26/2006 Document Revised: 04/20/2011 Document Reviewed: 12/26/2008 °[employer]® Patient Information ©2015 [employer]. This information is not intended to replace advice given to you by your health care provider. Make sure you discuss any questions you have with your health care provider. ° °

## 2014-06-15 NOTE — Anesthesia Preprocedure Evaluation (Signed)
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 , hx peripheral neuropathy    GI/Hepatic negative GI ROS,   Endo/Other    Renal/GU      Musculoskeletal  (+) Arthritis -,   Abdominal   Peds  Hematology   Anesthesia Other Findings   Reproductive/Obstetrics                             Anesthesia Physical Anesthesia Plan  ASA: II  Anesthesia Plan: General   Post-op Pain Management:    Induction: Intravenous  Airway Management Planned: LMA  Additional Equipment:   Intra-op Plan:   Post-operative Plan: Extubation in OR  Informed Consent: I have reviewed the patients History and Physical, chart, labs and discussed the procedure including the risks, benefits and alternatives for the proposed anesthesia with the patient or authorized representative who has indicated his/her understanding and acceptance.     Plan Discussed with:   Anesthesia Plan Comments:         Anesthesia Quick Evaluation

## 2014-06-15 NOTE — Anesthesia Postprocedure Evaluation (Deleted)
  Anesthesia Post-op Note  Patient: Philip Stone  Procedure(s) Performed: Procedure(s): RIGHT INGUINAL HERNIORRHAPHY  WITH MESH (Right)  Patient Location: PACU  Anesthesia Type:General  Level of Consciousness: awake, alert , oriented and patient cooperative  Airway and Oxygen Therapy: Patient Spontanous Breathing  Post-op Pain: 5 /10, mild  Post-op Assessment: Post-op Vital signs reviewed, Patient's Cardiovascular Status Stable, Respiratory Function Stable, Patent Airway, No signs of Nausea or vomiting and No headache  Post-op Vital Signs: Reviewed and stable  Last Vitals:  Filed Vitals:   06/15/14 0900  BP: 136/84  Pulse:   Temp:   Resp: 16    Complications: No apparent anesthesia complications

## 2014-06-15 NOTE — Anesthesia Procedure Notes (Addendum)
Procedure Name: LMA Insertion Date/Time: 06/15/2014 9:30 AM Performed by: Charmaine Downs Pre-anesthesia Checklist: Emergency Drugs available, Patient identified, Suction available and Patient being monitored Patient Re-evaluated:Patient Re-evaluated prior to inductionOxygen Delivery Method: Circle system utilized Preoxygenation: Pre-oxygenation with 100% oxygen Intubation Type: IV induction Ventilation: Mask ventilation without difficulty Grade View: Grade II Number of attempts: 1 Placement Confirmation: positive ETCO2 and breath sounds checked- equal and bilateral Tube secured with: Tape Dental Injury: Teeth and Oropharynx as per pre-operative assessment    Performed by: Elai Vanwyk J LMA: LMA inserted LMA Size: 5.0

## 2014-06-15 NOTE — Op Note (Signed)
Patient:  Philip Stone  DOB:  [birth year]  MRN:  [id number]   Preop Diagnosis:   Right inguinal hernia  Postop Diagnosis:   same  Procedure:   Right  Inguinal herniorrhaphy with mesh  Surgeon:   Aviva Signs, M.D.  Anes:   general  Indications:   Patient is a 64 year old white male who presents with a symptomatic right inguinal hernia. The risks and benefits of the procedure including bleeding, infection, mesh use, and the possibility of recurrence of the hernia fully explained to the patient, who gave informed consent.  Procedure note:   The patient was placed the supine position. After general anesthesia was administered, the right groin region was prepped and draped using usual sterile technique with CBG. Surgical site confirmation was performed.   an incision was made in the right groin region down to the external oblique aponeuroses. The aponeuroses was incised to the external ring. A Penrose drain was placed around the spermatic cord. The vase deferens was noted within the spermatic cord. The ilioinguinal nerve was identified and retracted superiorly from the operative field. The patient had a small lipoma of the cord and a high ligation was done using 3-0 Vicryl tie. The patient had both an indirect and direct hernia component. A large Bard PerFix plug was placed in the internal indirect bili and a medium size Bard prefix plug was placed in the floor of inguinal canal.  An onlay patch was then placed along the floor of the inguinal canal and secured superiorly to the conjoined tendon and inferiorly to shelving edge of Poupart's ligament using 2-0 Novafil interrupted sutures. The internal ring was re-created using a 2-0 Novafil interrupted suture. The external oblique aponeuroses was reapproximated using a 2-0 Vicryl running suture. Subcutaneous layer was reapproximated using 3-0 Vicryl interrupted sutures.  Exparel was instilled into the surrounding wound. The skin was closed using a 4-0  Vicryl subcuticular suture.  LiquiBand was applied.   All tape and needle counts were correct at the end of the procedure. Patient was awakened and transferred to PACU in stable condition.  Complications:   none  EBL:   minimal  Specimen:   none

## 2014-06-15 NOTE — Transfer of Care (Signed)
Immediate Anesthesia Transfer of Care Note  Patient: Philip Stone  Procedure(s) Performed: Procedure(s): RIGHT INGUINAL HERNIORRHAPHY  WITH MESH (Right) INSERTION OF MESH RIGHT INGUINAL HERNIORRHAPHY (Right)  Patient Location: PACU  Anesthesia Type:General  Level of Consciousness: awake and patient cooperative  Airway & Oxygen Therapy: Patient Spontanous Breathing and Patient connected to face mask oxygen  Post-op Assessment: Report given to RN, Post -op Vital signs reviewed and stable and Patient moving all extremities  Post vital signs: Reviewed and stable  Last Vitals:  Filed Vitals:   06/15/14 1103  BP:   Pulse: 54  Temp:   Resp: 10    Complications: No apparent anesthesia complications

## 2014-06-18 ENCOUNTER — Encounter (HOSPITAL_COMMUNITY): Payer: Self-pay | Admitting: General Surgery

## 2014-07-26 ENCOUNTER — Other Ambulatory Visit: Payer: Self-pay

## 2014-07-26 MED ORDER — TAMSULOSIN HCL 0.4 MG PO CAPS: 0.8000 mg | ORAL_CAPSULE | Freq: Every day | ORAL | Status: DC

## 2014-08-20 ENCOUNTER — Telehealth: Payer: Self-pay | Admitting: Family Medicine

## 2014-08-21 MED ORDER — DICLOFENAC SODIUM 75 MG PO TBEC: 75.0000 mg | DELAYED_RELEASE_TABLET | Freq: Two times a day (BID) | ORAL | Status: DC

## 2014-10-10 ENCOUNTER — Other Ambulatory Visit: Payer: Self-pay | Admitting: Family Medicine

## 2014-10-10 MED ORDER — CYCLOBENZAPRINE HCL 10 MG PO TABS: 10.0000 mg | ORAL_TABLET | Freq: Every day | ORAL | Status: DC

## 2014-10-29 ENCOUNTER — Other Ambulatory Visit: Payer: Self-pay | Admitting: Family Medicine

## 2014-10-31 ENCOUNTER — Telehealth: Payer: Self-pay | Admitting: Family Medicine

## 2014-10-31 NOTE — Telephone Encounter (Signed)
Pt called before lunchtime at noon. TC was picked up by Carl[facility], [employer] and appt made for pt to see Dr. Livia Snellen since he has not been seen for any issues needing to be followed by a Urologist.

## 2014-11-05 ENCOUNTER — Ambulatory Visit (INDEPENDENT_AMBULATORY_CARE_PROVIDER_SITE_OTHER): Payer: 59 | Admitting: Family Medicine

## 2014-11-05 ENCOUNTER — Encounter: Payer: Self-pay | Admitting: Family Medicine

## 2014-11-05 VITALS — BP 124/71 | HR 65 | Temp 97.5°F | Ht 70.0 in | Wt 190.4 lb

## 2014-11-05 DIAGNOSIS — R972 Elevated prostate specific antigen [PSA]: Secondary | ICD-10-CM | POA: Diagnosis not present

## 2014-11-05 DIAGNOSIS — M1990 Unspecified osteoarthritis, unspecified site: Secondary | ICD-10-CM | POA: Diagnosis not present

## 2014-11-05 DIAGNOSIS — N5203 Combined arterial insufficiency and corporo-venous occlusive erectile dysfunction: Secondary | ICD-10-CM

## 2014-11-05 DIAGNOSIS — N400 Enlarged prostate without lower urinary tract symptoms: Secondary | ICD-10-CM | POA: Diagnosis not present

## 2014-11-05 NOTE — Progress Notes (Signed)
Subjective:  Patient ID: Philip Stone, male    DOB: 1950-04-11  Age: 64 y.o. MRN: [id number]  CC: Erectile Dysfunction   HPI Philip Stone presents for  Erectile dysfunction. Viagra & cialis were not effective in the past. Saw urology in [place]. Using penile injection. Wants implanted pump. His BPH seems to be stable with regard to nocturia being once a night at most. He continues to take the tamsulosin.  He continues to rely on the diclofenac for his arthritis. That seems to be keeping his symptoms under control. It is primarily affecting his knees. He has  pain in his feet as well. He had major surgery in his left foot as a young child. He continues to have pain that is moderately severe. He wears brace that seems to give some relief.  History Philip Stone has a past medical history of Arthritis; BPH (benign prostatic hyperplasia); Neuropathy; Platelets decreased; and Foot drop, right.   He has past surgical history that includes Cervical disc surgery; Knee arthroscopy (Right); Foot arthroplasty; Inguinal hernia repair (Right, 06/15/2014); and Insertion of mesh (Right, 06/15/2014).   His family history is not on file.He reports that he quit smoking about 20 years ago. His smoking use included Cigarettes. He started smoking about 25 years ago. He has a 18 pack-year smoking history. He does not have any smokeless tobacco history on file. He reports that he does not drink alcohol or use illicit drugs.  Outpatient Prescriptions Prior to Visit  Medication Sig Dispense Refill  . cyclobenzaprine (FLEXERIL) 10 MG tablet Take 1 tablet (10 mg total) by mouth at bedtime. MAY INCREASE TO THREE A DAY AS NEEDED FOR STIFFNESS. 90 tablet 1  . diclofenac (VOLTAREN) 75 MG EC tablet Take 1 tablet (75 mg total) by mouth 2 (two) times daily. Take with food. 180 tablet 1  . tamsulosin (FLOMAX) 0.4 MG CAPS capsule TAKE TWO CAPSULES BY MOUTH DAILY AT BEDTIME 60 capsule 1  . oxyCODONE-acetaminophen (PERCOCET) 7.5-325 MG  per tablet Take 1-2 tablets by mouth every 4 (four) hours as needed. (Patient not taking: Reported on 11/05/2014) 50 tablet 0   No facility-administered medications prior to visit.    ROS Review of Systems  Constitutional: Negative for fever, chills and diaphoresis.  HENT: Negative for congestion, rhinorrhea and sore throat.   Respiratory: Negative for cough, shortness of breath and wheezing.   Cardiovascular: Negative for chest pain.  Gastrointestinal: Negative for nausea, vomiting, abdominal pain, diarrhea, constipation and abdominal distention.  Genitourinary: Positive for frequency. Negative for dysuria.  Musculoskeletal: Positive for myalgias, joint swelling, arthralgias and gait problem.  Skin: Negative for rash.  Neurological: Negative for headaches.    Objective:  BP 124/71 mmHg  Pulse 65  Temp(Src) 97.5 F (36.4 C) (Oral)  Ht 5\' 10"  (1.778 m)  Wt 190 lb 6.4 oz (86.365 kg)  BMI 27.32 kg/m2  BP Readings from Last 3 Encounters:  11/05/14 124/71  06/15/14 171/83  06/12/14 144/78    Wt Readings from Last 3 Encounters:  11/05/14 190 lb 6.4 oz (86.365 kg)  06/15/14 186 lb (84.369 kg)  06/12/14 186 lb (84.369 kg)     Physical Exam  Constitutional: He appears well-developed and well-nourished.  HENT:  Head: Normocephalic and atraumatic.  Right Ear: Tympanic membrane and external ear normal. No decreased hearing is noted.  Left Ear: Tympanic membrane and external ear normal. No decreased hearing is noted.  Mouth/Throat: No oropharyngeal exudate or posterior oropharyngeal erythema.  Eyes: Pupils are equal, round, and  reactive to light.  Neck: Normal range of motion. Neck supple.  Cardiovascular: Normal rate and regular rhythm.   No murmur heard. Pulmonary/Chest: Breath sounds normal. No respiratory distress.  Abdominal: Soft. Bowel sounds are normal. He exhibits no mass. There is no tenderness.  Vitals reviewed.   No results found for: HGBA1C  Lab Results   Component Value Date   WBC 4.1 06/12/2014   HGB 14.0 06/12/2014   HCT 41.0 06/12/2014   PLT 103* 06/12/2014   GLUCOSE 98 06/12/2014   CHOL 202* 04/11/2014   TRIG 82 04/11/2014   HDL 59 04/11/2014   LDLCALC 127* 04/11/2014   ALT 26 04/11/2014   AST 24 04/11/2014   NA 140 06/12/2014   K 4.0 06/12/2014   CL 107 06/12/2014   CREATININE 0.95 06/12/2014   BUN 15 06/12/2014   CO2 27 06/12/2014   PSA 7.6* 05/14/2014    No results found.  Assessment & Plan:   Philip Stone was seen today for erectile dysfunction.  Diagnoses and all orders for this visit:  BPH (benign prostatic hyperplasia)  Arthritis  Combined arterial insufficiency and corporo-venous occlusive erectile dysfunction -     Ambulatory referral to Urology  Benign prostatic hyperplasia with elevated prostate specific antigen (PSA)  I have discontinued Philip Stone's oxyCODONE-acetaminophen. I am also having him maintain his diclofenac, cyclobenzaprine, and tamsulosin.  No orders of the defined types were placed in this encounter.     Follow-up: Return in about 6 months (around 05/05/2015) for CPE, elevated PSA.  Claretta Fraise, M.D.

## 2014-11-06 LAB — CMP14+EGFR
ALT: 20 IU/L (ref 0–44)
AST: 22 IU/L (ref 0–40)
Albumin/Globulin Ratio: 1.7 (ref 1.1–2.5)
Albumin: 4.4 g/dL (ref 3.6–4.8)
Alkaline Phosphatase: 85 IU/L (ref 39–117)
BUN/Creatinine Ratio: 11 (ref 10–22)
BUN: 12 mg/dL (ref 8–27)
Bilirubin Total: 0.9 mg/dL (ref 0.0–1.2)
CO2: 24 mmol/L (ref 18–29)
Calcium: 9.3 mg/dL (ref 8.6–10.2)
Chloride: 100 mmol/L (ref 97–108)
Creatinine, Ser: 1.06 mg/dL (ref 0.76–1.27)
GFR calc Af Amer: 87 mL/min/{1.73_m2} (ref >59)
GFR calc non Af Amer: 75 mL/min/{1.73_m2} (ref >59)
Globulin, Total: 2.6 g/dL (ref 1.5–4.5)
Glucose: 92 mg/dL (ref 65–99)
Potassium: 4.8 mmol/L (ref 3.5–5.2)
Sodium: 140 mmol/L (ref 134–144)
Total Protein: 7 g/dL (ref 6.0–8.5)

## 2014-11-06 LAB — PSA, TOTAL AND FREE
PSA, Free Pct: 32.2 %
PSA, Free: 2.22 ng/mL
Prostate Specific Ag, Serum: 6.9 ng/mL — ABNORMAL HIGH (ref 0.0–4.0)

## 2014-12-29 ENCOUNTER — Other Ambulatory Visit: Payer: Self-pay | Admitting: Family Medicine

## 2015-01-28 ENCOUNTER — Telehealth: Payer: Self-pay | Admitting: Family Medicine

## 2015-01-28 NOTE — Telephone Encounter (Signed)
error 

## 2015-02-21 ENCOUNTER — Telehealth: Payer: Self-pay | Admitting: Family Medicine

## 2015-02-25 ENCOUNTER — Ambulatory Visit (INDEPENDENT_AMBULATORY_CARE_PROVIDER_SITE_OTHER): Payer: PPO

## 2015-02-25 ENCOUNTER — Ambulatory Visit (INDEPENDENT_AMBULATORY_CARE_PROVIDER_SITE_OTHER): Payer: PPO | Admitting: Family Medicine

## 2015-02-25 ENCOUNTER — Encounter: Payer: Self-pay | Admitting: Family Medicine

## 2015-02-25 VITALS — BP 153/84 | HR 61 | Temp 97.4°F | Ht 70.0 in | Wt 189.2 lb

## 2015-02-25 DIAGNOSIS — M25521 Pain in right elbow: Secondary | ICD-10-CM

## 2015-02-25 DIAGNOSIS — S46111A Strain of muscle, fascia and tendon of long head of biceps, right arm, initial encounter: Secondary | ICD-10-CM | POA: Diagnosis not present

## 2015-02-25 DIAGNOSIS — S46211A Strain of muscle, fascia and tendon of other parts of biceps, right arm, initial encounter: Secondary | ICD-10-CM

## 2015-02-25 NOTE — Progress Notes (Signed)
Subjective:  Patient ID: Quintavis Tranchina, male    DOB: 1950/06/04  Age: 65 y.o. MRN: [id number]  CC: Elbow Pain   HPI Jin Bakalar presents for pain at the antecubital fossa on the right. Sudden onset whle performing curls during a weight lifting work out. Onset 2 days ago. Pain is increasing. It is mild except when he tries to flex or supinate at the right elbow.  History Yerick has a past medical history of Arthritis; BPH (benign prostatic hyperplasia); Neuropathy ([place]); Platelets decreased ([place]); and Foot drop, right.   He has past surgical history that includes Cervical disc surgery; Knee arthroscopy (Right); Foot arthroplasty; Inguinal hernia repair (Right, 06/15/2014); and Insertion of mesh (Right, 06/15/2014).   His family history is not on file.He reports that he quit smoking about 20 years ago. His smoking use included Cigarettes. He started smoking about 25 years ago. He has a 18 pack-year smoking history. He does not have any smokeless tobacco history on file. He reports that he does not drink alcohol or use illicit drugs.  Outpatient Prescriptions Prior to Visit  Medication Sig Dispense Refill  . cyclobenzaprine (FLEXERIL) 10 MG tablet Take 1 tablet (10 mg total) by mouth at bedtime. MAY INCREASE TO THREE A DAY AS NEEDED FOR STIFFNESS. 90 tablet 1  . diclofenac (VOLTAREN) 75 MG EC tablet Take 1 tablet (75 mg total) by mouth 2 (two) times daily. Take with food. 180 tablet 1  . tamsulosin (FLOMAX) 0.4 MG CAPS capsule TAKE TWO CAPSULES BY MOUTH DAILY AT BEDTIME 60 capsule 3   No facility-administered medications prior to visit.    ROS Review of Systems  Constitutional: Negative for fever, chills and diaphoresis.  HENT: Negative for rhinorrhea and sore throat.   Respiratory: Negative for cough and shortness of breath.   Cardiovascular: Negative for chest pain.  Gastrointestinal: Negative for abdominal pain.  Musculoskeletal: Positive for myalgias and arthralgias.        See HPI   Skin: Negative for rash.  Neurological: Negative for weakness and headaches.    Objective:  BP 153/84 mmHg  Pulse 61  Temp(Src) 97.4 F (36.3 C) (Oral)  Ht 5\' 10"  (1.778 m)  Wt 189 lb 3.2 oz (85.821 kg)  BMI 27.15 kg/m2  BP Readings from Last 3 Encounters:  02/25/15 153/84  11/05/14 124/71  06/15/14 171/83    Wt Readings from Last 3 Encounters:  02/25/15 189 lb 3.2 oz (85.821 kg)  11/05/14 190 lb 6.4 oz (86.365 kg)  06/15/14 186 lb (84.369 kg)     Physical Exam  Constitutional: He is oriented to person, place, and time. He appears well-developed and well-nourished.  HENT:  Head: Normocephalic and atraumatic.  Mouth/Throat: No posterior oropharyngeal erythema.  Eyes: Pupils are equal, round, and reactive to light.  Neck: Normal range of motion. Neck supple.  Cardiovascular: Normal rate and regular rhythm.   No murmur heard. Pulmonary/Chest: Breath sounds normal. No respiratory distress.  Abdominal: Bowel sounds are normal.  Musculoskeletal: He exhibits tenderness ( at distal arm. There is a tight muscular knot at the distal biceps with a fossa between that and the antecubital  region at the right elbow. There is FROM but strength is decreased to 3-4/5 for flexion and 3/5 for supination).  Neurological: He is alert and oriented to person, place, and time.  Vitals reviewed.    Lab Results  Component Value Date   WBC 4.1 06/12/2014   HGB 14.0 06/12/2014   HCT 41.0 06/12/2014   PLT  103* 06/12/2014   GLUCOSE 92 11/05/2014   CHOL 202* 04/11/2014   TRIG 82 04/11/2014   HDL 59 04/11/2014   LDLCALC 127* 04/11/2014   ALT 20 11/05/2014   AST 22 11/05/2014   NA 140 11/05/2014   K 4.8 11/05/2014   CL 100 11/05/2014   CREATININE 1.06 11/05/2014   BUN 12 11/05/2014   CO2 24 11/05/2014   PSA 7.6* 05/14/2014    No results found.  Assessment & Plan:   Travante was seen today for elbow pain.  Diagnoses and all orders for this visit:  Right elbow pain -     DG  Elbow 2 Views Right; Future -     DME Other see comment -     [name]; Future  Biceps muscle tear, right, initial encounter -     DME Other see comment -     [name]; Future   I am having Mr. Valtierra maintain his diclofenac, cyclobenzaprine, and tamsulosin.  No orders of the defined types were placed in this encounter.   Use sling to avoid worsening what appears to be a partial tear  Follow-up: Return in about 2 weeks (around 03/11/2015).  Claretta Fraise, M.D.

## 2015-03-06 ENCOUNTER — Other Ambulatory Visit: Payer: Self-pay | Admitting: Family Medicine

## 2015-03-14 ENCOUNTER — Ambulatory Visit (HOSPITAL_COMMUNITY)
Admission: RE | Admit: 2015-03-14 | Discharge: 2015-03-14 | Disposition: A | Discharge status: Home / Self Care | Payer: PPO | Source: Ambulatory Visit | Attending: Family Medicine | Admitting: Family Medicine

## 2015-03-14 DIAGNOSIS — M7020 Olecranon bursitis, unspecified elbow: Secondary | ICD-10-CM | POA: Diagnosis not present

## 2015-03-14 DIAGNOSIS — M6588 Other synovitis and tenosynovitis, other site: Secondary | ICD-10-CM | POA: Insufficient documentation

## 2015-03-14 DIAGNOSIS — M7551 Bursitis of right shoulder: Secondary | ICD-10-CM | POA: Diagnosis not present

## 2015-03-14 DIAGNOSIS — M129 Arthropathy, unspecified: Secondary | ICD-10-CM | POA: Insufficient documentation

## 2015-03-14 DIAGNOSIS — S46211A Strain of muscle, fascia and tendon of other parts of biceps, right arm, initial encounter: Secondary | ICD-10-CM

## 2015-03-14 DIAGNOSIS — M71521 Other bursitis, not elsewhere classified, right elbow: Secondary | ICD-10-CM | POA: Diagnosis not present

## 2015-03-14 DIAGNOSIS — M25521 Pain in right elbow: Secondary | ICD-10-CM | POA: Insufficient documentation

## 2015-03-14 DIAGNOSIS — M65821 Other synovitis and tenosynovitis, right upper arm: Secondary | ICD-10-CM | POA: Diagnosis not present

## 2015-03-15 ENCOUNTER — Other Ambulatory Visit: Payer: Self-pay | Admitting: *Deleted

## 2015-03-15 DIAGNOSIS — M719 Bursopathy, unspecified: Secondary | ICD-10-CM

## 2015-03-21 DIAGNOSIS — M25521 Pain in right elbow: Secondary | ICD-10-CM | POA: Diagnosis not present

## 2015-04-27 ENCOUNTER — Other Ambulatory Visit: Payer: Self-pay | Admitting: Family Medicine

## 2015-05-01 DIAGNOSIS — Z0000 Encounter for general adult medical examination without abnormal findings: Secondary | ICD-10-CM | POA: Diagnosis not present

## 2015-05-01 DIAGNOSIS — Z6825 Body mass index (BMI) 25.0-25.9, adult: Secondary | ICD-10-CM | POA: Diagnosis not present

## 2015-05-17 LAB — FECAL OCCULT BLOOD, GUAIAC: Fecal Occult Blood: NEGATIVE

## 2015-05-28 ENCOUNTER — Other Ambulatory Visit: Payer: Self-pay | Admitting: Family Medicine

## 2015-06-18 ENCOUNTER — Other Ambulatory Visit: Payer: Self-pay | Admitting: Family Medicine

## 2015-06-27 ENCOUNTER — Encounter: Payer: Self-pay | Admitting: *Deleted

## 2015-07-30 ENCOUNTER — Other Ambulatory Visit: Payer: Self-pay | Admitting: Family Medicine

## 2015-08-20 DIAGNOSIS — G44219 Episodic tension-type headache, not intractable: Secondary | ICD-10-CM | POA: Diagnosis not present

## 2015-08-28 ENCOUNTER — Other Ambulatory Visit: Payer: Self-pay | Admitting: Family Medicine

## 2015-08-28 NOTE — Telephone Encounter (Signed)
Last seen 1/17 stacks.

## 2015-09-17 ENCOUNTER — Other Ambulatory Visit: Payer: Self-pay | Admitting: Family Medicine

## 2015-11-25 ENCOUNTER — Other Ambulatory Visit: Payer: Self-pay | Admitting: Family Medicine

## 2015-11-26 NOTE — Telephone Encounter (Signed)
Authorize 30 days only. Then contact the patient letting them know that they will need an appointment before any further prescriptions can be sent in. 

## 2015-11-28 ENCOUNTER — Other Ambulatory Visit: Payer: Self-pay | Admitting: Family Medicine

## 2015-12-02 ENCOUNTER — Other Ambulatory Visit: Payer: Self-pay | Admitting: Family Medicine

## 2015-12-02 NOTE — Telephone Encounter (Signed)
Authorize 30 days only. Then contact the patient letting them know that they will need an appointment before any further prescriptions can be sent in. 

## 2015-12-03 NOTE — Telephone Encounter (Signed)
LVOVM NTBS before next refill

## 2015-12-12 ENCOUNTER — Encounter: Payer: Self-pay | Admitting: Family Medicine

## 2015-12-12 ENCOUNTER — Ambulatory Visit (INDEPENDENT_AMBULATORY_CARE_PROVIDER_SITE_OTHER): Payer: PPO | Admitting: Family Medicine

## 2015-12-12 ENCOUNTER — Encounter (INDEPENDENT_AMBULATORY_CARE_PROVIDER_SITE_OTHER): Payer: Self-pay

## 2015-12-12 VITALS — BP 129/67 | HR 65 | Temp 97.9°F | Ht 70.0 in | Wt 190.1 lb

## 2015-12-12 DIAGNOSIS — R972 Elevated prostate specific antigen [PSA]: Secondary | ICD-10-CM

## 2015-12-12 DIAGNOSIS — K4090 Unilateral inguinal hernia, without obstruction or gangrene, not specified as recurrent: Secondary | ICD-10-CM

## 2015-12-12 DIAGNOSIS — N400 Enlarged prostate without lower urinary tract symptoms: Secondary | ICD-10-CM | POA: Diagnosis not present

## 2015-12-12 DIAGNOSIS — K2100 Gastro-esophageal reflux disease with esophagitis, without bleeding: Secondary | ICD-10-CM

## 2015-12-12 DIAGNOSIS — M1710 Unilateral primary osteoarthritis, unspecified knee: Secondary | ICD-10-CM

## 2015-12-12 MED ORDER — MELOXICAM 15 MG PO TABS: 15.0000 mg | ORAL_TABLET | Freq: Every day | ORAL | 5 refills | Status: DC

## 2015-12-12 MED ORDER — PANTOPRAZOLE SODIUM 40 MG PO TBEC: 40.0000 mg | DELAYED_RELEASE_TABLET | Freq: Every day | ORAL | 3 refills | Status: DC

## 2015-12-12 NOTE — Addendum Note (Signed)
Addended by: Claretta Fraise on: 12/12/2015 10:22 AM   Modules accepted: Orders

## 2015-12-12 NOTE — Progress Notes (Addendum)
 Subjective:  Patient ID: Philip Stone, male    DOB: 10/13/1950  Age: 65 y.o. MRN: [id number]  CC: Medication Refill (pt here today for medication refill and also c/o soreness in the right inguinal area where he had the hernia repair)   HPI [name] presents for Going to the restroom to urinate 2-3 times starting at 5 AM then again at 8 AM and again later in the morning. Does not have to get up or go more frequently than every 2 hours. Patient also having some right knee pain. He's not been taking the diclofenac regularly because it makes him dizzy in the mornings when he goes to work out. He does take the Flexeril every now and then. Not regularly. The area just medial to the surgical site is sore with activity. Surgery was 18 months ago. Pain flares intermittently more recently than he has had in a while. It is mild.  Having more heartburn. He does get temporary relief with Tums etc. This is been ongoing for 30-40 years. It is not exacerbated by his medicine. He stays away from spicy foods because of it. In spite of this he is still noting daily symptoms.  History [name] has a past medical history of Arthritis; BPH (benign prostatic hyperplasia); Foot drop, right; Neuropathy ([facility]); and Platelets decreased ([facility]).   He has a past surgical history that includes Cervical disc surgery; Knee arthroscopy (Right); Foot arthroplasty; Inguinal hernia repair (Right, 06/15/2014); and Insertion of mesh (Right, 06/15/2014).   His family history is not on file.He reports that he quit smoking about 21 years ago. His smoking use included Cigarettes. He started smoking about 26 years ago. He has a 18.00 pack-year smoking history. He has never used smokeless tobacco. He reports that he does not drink alcohol or use drugs.    ROS Review of Systems  Constitutional: Negative for chills, diaphoresis, fever and unexpected weight change.  HENT: Negative for congestion, hearing loss, rhinorrhea and sore throat.    Eyes: Negative for visual disturbance.  Respiratory: Negative for cough and shortness of breath.   Cardiovascular: Negative for chest pain.  Gastrointestinal: Positive for abdominal pain. Negative for constipation and diarrhea.  Genitourinary: Negative for dysuria and flank pain.  Musculoskeletal: Positive for arthralgias. Negative for back pain and joint swelling.  Skin: Negative for rash.  Neurological: Positive for light-headedness. Negative for dizziness and headaches.  Psychiatric/Behavioral: Negative for dysphoric mood and sleep disturbance.    Objective:  BP 129/67   Pulse 65   Temp 97.9 F (36.6 C) (Oral)   Ht 5' 10" (1.778 m)   Wt 190 lb 2 oz (86.2 kg)   BMI 27.28 kg/m   BP Readings from Last 3 Encounters:  12/12/15 129/67  02/25/15 (!) 153/84  11/05/14 124/71    Wt Readings from Last 3 Encounters:  12/12/15 190 lb 2 oz (86.2 kg)  03/14/15 189 lb (85.7 kg)  02/25/15 189 lb 3.2 oz (85.8 kg)     Physical Exam   Lab Results  Component Value Date   WBC 4.1 06/12/2014   HGB 14.0 06/12/2014   HCT 41.0 06/12/2014   PLT 103 (L) 06/12/2014   GLUCOSE 92 11/05/2014   CHOL 202 (H) 04/11/2014   TRIG 82 04/11/2014   HDL 59 04/11/2014   LDLCALC 127 (H) 04/11/2014   ALT 20 11/05/2014   AST 22 11/05/2014   NA 140 11/05/2014   K 4.8 11/05/2014   CL 100 11/05/2014   CREATININE 1.06 11/05/2014     BUN 12 11/05/2014   CO2 24 11/05/2014   PSA 7.6 (H) 05/14/2014    [name]  Result Date: 03/15/2015 CLINICAL DATA:  Painful mass of distal biceps. EXAM: MRI OF THE RIGHT HUMERUS WITHOUT CONTRAST TECHNIQUE: Multiplanar, multisequence MR imaging was performed. No intravenous contrast was administered. COMPARISON:  02/25/2015 radiograph FINDINGS: The long and short heads of the biceps appear intact but there is abnormal edema surrounding the distal biceps tendon over its distal 4 cm resembling tenosynovitis. I do not see that the distal biceps tendon is torn  away from the bicipital tuberosity. Image 17 series 9 shows the fluid in this vicinity. Adjacent brachialis tendon intact. There is also a fluid collection superficial to the distal triceps tendon and olecranon compatible with olecranon bursitis, collection measuring 4.0 by 2.7 by 2.0 cm, very minimal internal septation, no significant surrounding subcutaneous edema, no defect in the triceps tendon. The triceps tendon is lax due to extension in the elbow during imaging. There is evidence of mild degenerative AC joint arthropathy and trace subacromial subdeltoid bursitis. The increased signal in the radial head on image 22 series 9 is thought to be spurious given the lack of low T1 signal and lack of corroborative finding in other imaging planes. Mild degenerative right sternoclavicular arthropathy. IMPRESSION: 1. Distal biceps tenosynovitis with fluid surrounding the distal biceps tendon over the distal 4 cm. No overt tear of the biceps tendon is identified. 2. Olecranon bursitis. 3. Trace subacromial subdeltoid bursitis. Mild degenerative AC joint arthropathy. 4. Mild degenerative right sternoclavicular arthropathy. Electronically Signed   By: Van Clines M.D.   On: 03/15/2015 07:00    Assessment & Plan:   [name] was seen today for medication refill.  Diagnoses and all orders for this visit:  Arthritis of knee -     CBC with Differential/Platelet -     CMP14+EGFR  Benign prostatic hyperplasia with elevated prostate specific antigen (PSA) -     CMP14+EGFR  Right inguinal hernia Comments: Postoperative pain from the mesh Orders: -     CMP14+EGFR  Gastroesophageal reflux disease with esophagitis -     CBC with Differential/Platelet -     CMP14+EGFR  Other orders -     meloxicam (MOBIC) 15 MG tablet; Take 1 tablet (15 mg total) by mouth daily. For joint and muscle pain -     pantoprazole (PROTONIX) 40 MG tablet; Take 1 tablet (40 mg total) by mouth daily. For  stomach    discomfort from the mesh is noted to have a tendon in close proximity to the edge of the mesh. We discussed heat and demonstrated some stretching exercises for the region.  I have discontinued Mr. Yackel's diclofenac. I am also having him start on meloxicam and pantoprazole. Additionally, I am having him maintain his cyclobenzaprine and tamsulosin.  Meds ordered this encounter  Medications  . meloxicam (MOBIC) 15 MG tablet    Sig: Take 1 tablet (15 mg total) by mouth daily. For joint and muscle pain    Dispense:  30 tablet    Refill:  5  . pantoprazole (PROTONIX) 40 MG tablet    Sig: Take 1 tablet (40 mg total) by mouth daily. For stomach    Dispense:  30 tablet    Refill:  3     Follow-up: Return in about 6 months (around 06/10/2016).  Claretta Fraise, M.D.

## 2015-12-13 LAB — CBC WITH DIFFERENTIAL/PLATELET
Basophils Absolute: 0 10*3/uL (ref 0.0–0.2)
Basos: 0 %
EOS (ABSOLUTE): 0.2 10*3/uL (ref 0.0–0.4)
Eos: 5 %
Hematocrit: 40.3 % (ref 37.5–51.0)
Hemoglobin: 13.9 g/dL (ref 12.6–17.7)
Immature Grans (Abs): 0 10*3/uL (ref 0.0–0.1)
Immature Granulocytes: 0 %
Lymphocytes Absolute: 1.2 10*3/uL (ref 0.7–3.1)
Lymphs: 26 %
MCH: 30 pg (ref 26.6–33.0)
MCHC: 34.5 g/dL (ref 31.5–35.7)
MCV: 87 fL (ref 79–97)
Monocytes Absolute: 0.6 10*3/uL (ref 0.1–0.9)
Monocytes: 13 %
Neutrophils Absolute: 2.5 10*3/uL (ref 1.4–7.0)
Neutrophils: 56 %
Platelets: 121 10*3/uL — ABNORMAL LOW (ref 150–379)
RBC: 4.64 x10E6/uL (ref 4.14–5.80)
RDW: 14.7 % (ref 12.3–15.4)
WBC: 4.6 10*3/uL (ref 3.4–10.8)

## 2015-12-13 LAB — CMP14+EGFR
ALT: 32 IU/L (ref 0–44)
AST: 33 IU/L (ref 0–40)
Albumin/Globulin Ratio: 1.6 (ref 1.2–2.2)
Albumin: 4.3 g/dL (ref 3.6–4.8)
Alkaline Phosphatase: 85 IU/L (ref 39–117)
BUN/Creatinine Ratio: 16 (ref 10–24)
BUN: 16 mg/dL (ref 8–27)
Bilirubin Total: 0.6 mg/dL (ref 0.0–1.2)
CO2: 25 mmol/L (ref 18–29)
Calcium: 9.4 mg/dL (ref 8.6–10.2)
Chloride: 101 mmol/L (ref 96–106)
Creatinine, Ser: 1.02 mg/dL (ref 0.76–1.27)
GFR calc Af Amer: 90 mL/min/{1.73_m2} (ref >59)
GFR calc non Af Amer: 78 mL/min/{1.73_m2} (ref >59)
Globulin, Total: 2.7 g/dL (ref 1.5–4.5)
Glucose: 90 mg/dL (ref 65–99)
Potassium: 4.6 mmol/L (ref 3.5–5.2)
Sodium: 140 mmol/L (ref 134–144)
Total Protein: 7 g/dL (ref 6.0–8.5)

## 2015-12-23 ENCOUNTER — Other Ambulatory Visit: Payer: Self-pay | Admitting: Nurse Practitioner

## 2015-12-29 ENCOUNTER — Other Ambulatory Visit: Payer: Self-pay | Admitting: Family Medicine

## 2016-01-27 ENCOUNTER — Other Ambulatory Visit: Payer: Self-pay | Admitting: Nurse Practitioner

## 2016-03-31 ENCOUNTER — Other Ambulatory Visit: Payer: Self-pay | Admitting: Family Medicine

## 2016-04-15 ENCOUNTER — Encounter: Payer: Self-pay | Admitting: *Deleted

## 2016-05-29 ENCOUNTER — Other Ambulatory Visit: Payer: Self-pay | Admitting: Family Medicine

## 2016-06-04 ENCOUNTER — Other Ambulatory Visit: Payer: Self-pay | Admitting: Nurse Practitioner

## 2016-06-05 ENCOUNTER — Other Ambulatory Visit: Payer: Self-pay | Admitting: *Deleted

## 2016-06-05 MED ORDER — PANTOPRAZOLE SODIUM 40 MG PO TBEC: 40.0000 mg | DELAYED_RELEASE_TABLET | Freq: Every day | ORAL | 0 refills | Status: DC

## 2016-06-08 ENCOUNTER — Encounter: Payer: Self-pay | Admitting: Family Medicine

## 2016-06-08 ENCOUNTER — Ambulatory Visit (INDEPENDENT_AMBULATORY_CARE_PROVIDER_SITE_OTHER): Payer: PPO | Admitting: Family Medicine

## 2016-06-08 VITALS — BP 131/63 | HR 65 | Temp 97.9°F | Ht 70.0 in | Wt 188.0 lb

## 2016-06-08 DIAGNOSIS — K2100 Gastro-esophageal reflux disease with esophagitis, without bleeding: Secondary | ICD-10-CM

## 2016-06-08 DIAGNOSIS — N400 Enlarged prostate without lower urinary tract symptoms: Secondary | ICD-10-CM

## 2016-06-08 DIAGNOSIS — R972 Elevated prostate specific antigen [PSA]: Secondary | ICD-10-CM | POA: Diagnosis not present

## 2016-06-08 DIAGNOSIS — M1710 Unilateral primary osteoarthritis, unspecified knee: Secondary | ICD-10-CM

## 2016-06-08 NOTE — Progress Notes (Signed)
Subjective:  Patient ID: Philip Stone, male    DOB: 06-05-1950  Age: 66 y.o. MRN: [id number]  CC: Follow-up (pt here today for routine follow up on his chronic medical conditions and also c/o right ear ringing.)   HPI Philip Stone presents for Continued pain in the knees that slows him down. He wears a hearing aid on the left. He does not wear one on the right because of expense. 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 Recently started some low buzzing and ringing on the right. He does not wish to see an ear nose and throat specialist for evaluation. There is no associated vertigo.  Follow-up BPH: Patient continues to use tamsulosin. He has no problems with frequency of urination but when he does have to go he has severe urgency. He has a chronically elevated PSA for which she's been followed by urology. He has an upcoming appointment with urology at which time he intends to discuss this problem with them.   History Philip Stone has a past medical history of Arthritis; BPH (benign prostatic hyperplasia); Foot drop, right; Neuropathy; and Platelets decreased ([place]).   He has a past surgical history that includes Cervical disc surgery; Knee arthroscopy (Right); Foot arthroplasty; Inguinal hernia repair (Right, 06/15/2014); and Insertion of mesh (Right, 06/15/2014).   His family history is not on file.He reports that he quit smoking about 22 years ago. His smoking use included Cigarettes. He started smoking about 27 years ago. He has a 18.00 pack-year smoking history. He has never used smokeless tobacco. He reports that he does not drink alcohol or use drugs.    ROS Review of Systems  Constitutional: Negative for chills, diaphoresis, fever and unexpected weight change.  HENT: Negative for congestion, hearing loss, rhinorrhea and sore throat.   Eyes: Negative for visual disturbance.  Respiratory: Negative for cough and shortness of breath.   Cardiovascular: Negative for chest pain.   Gastrointestinal: Negative for abdominal pain, constipation and diarrhea.  Endocrine: Negative for cold intolerance and heat intolerance.  Genitourinary: Positive for urgency. Negative for decreased urine volume, dysuria and flank pain.  Musculoskeletal: Negative for arthralgias and joint swelling.  Skin: Negative for rash.  Neurological: Negative for dizziness and headaches.  Psychiatric/Behavioral: Negative for dysphoric mood and sleep disturbance.    Objective:  BP 131/63   Pulse 65   Temp 97.9 F (36.6 C) (Oral)   Ht 5\' 10"  (1.778 m)   Wt 188 lb (85.3 kg)   BMI 26.98 kg/m   BP Readings from Last 3 Encounters:  06/08/16 131/63  12/12/15 129/67  02/25/15 (!) 153/84    Wt Readings from Last 3 Encounters:  06/08/16 188 lb (85.3 kg)  12/12/15 190 lb 2 oz (86.2 kg)  03/14/15 189 lb (85.7 kg)     Physical Exam  Constitutional: He is oriented to person, place, and time. He appears well-developed and well-nourished. No distress.  HENT:  Head: Normocephalic and atraumatic.  Right Ear: External ear normal.  Left Ear: External ear normal.  Nose: Nose normal.  Mouth/Throat: Oropharynx is clear and moist.  Eyes: Conjunctivae and EOM are normal. Pupils are equal, round, and reactive to light.  Neck: Normal range of motion. Neck supple. No thyromegaly present.  Cardiovascular: Normal rate, regular rhythm and normal heart sounds.   No murmur heard. Pulmonary/Chest: Effort normal and breath sounds normal. No respiratory distress. He has no wheezes. He has no rales.  Abdominal: Soft. Bowel sounds are normal. He exhibits no distension. There  is no tenderness.  Lymphadenopathy:    He has no cervical adenopathy.  Neurological: He is alert and oriented to person, place, and time. He has normal reflexes.  Skin: Skin is warm and dry.  Psychiatric: He has a normal mood and affect. His behavior is normal. Judgment and thought content normal.      Assessment & Plan:   Philip Stone was  seen today for follow-up.  Diagnoses and all orders for this visit:  Benign prostatic hyperplasia with elevated prostate specific antigen (PSA)  Gastroesophageal reflux disease with esophagitis  Arthritis of knee    Medications are to remain as is. He will follow-up with urology as discussed. He will practice Kegel exercises. Patient relates that he is only using the meloxicam when necessary. I emphasized the need for him to take it regularly. This will lead to the need a course to be sure he is taking his PPI daily area he says he is already doing that. Tamsulosin to remain as is.   I am having Philip Stone maintain his tamsulosin, meloxicam, cyclobenzaprine, and pantoprazole.  Allergies as of 06/08/2016      Reactions   Asa [aspirin] Anaphylaxis   Diclofenac Other (See Comments)   Lightheaded with workout      Medication List       Accurate as of 06/08/16  6:00 PM. Always use your most recent med list.          cyclobenzaprine 10 MG tablet Commonly known as:  FLEXERIL TAKE 1 TABLET BY MOUTH AT BEDTIME--MAY INCREASE TO 3 A DAY AS NEEDED FOR STIFFNESS   meloxicam 15 MG tablet Commonly known as:  MOBIC TAKE 1 TABLET (15 MG TOTAL) BY MOUTH DAILY. FOR JOINT AND MUSCLE PAIN   pantoprazole 40 MG tablet Commonly known as:  PROTONIX Take 1 tablet (40 mg total) by mouth daily. For stomach   tamsulosin 0.4 MG Caps capsule Commonly known as:  FLOMAX TAKE TWO CAPSULES BY MOUTH DAILY AT BEDTIME        Follow-up: Return in about 1 year (around 06/08/2017).  Claretta Fraise, M.D.

## 2016-06-12 ENCOUNTER — Ambulatory Visit: Payer: PPO | Admitting: Family Medicine

## 2016-06-15 DIAGNOSIS — R972 Elevated prostate specific antigen [PSA]: Secondary | ICD-10-CM | POA: Diagnosis not present

## 2016-06-15 DIAGNOSIS — N5201 Erectile dysfunction due to arterial insufficiency: Secondary | ICD-10-CM | POA: Diagnosis not present

## 2016-07-02 ENCOUNTER — Other Ambulatory Visit: Payer: Self-pay | Admitting: Family Medicine

## 2016-07-29 ENCOUNTER — Other Ambulatory Visit: Payer: Self-pay | Admitting: Family Medicine

## 2016-08-03 ENCOUNTER — Other Ambulatory Visit: Payer: Self-pay | Admitting: Family Medicine

## 2016-09-01 ENCOUNTER — Other Ambulatory Visit: Payer: Self-pay | Admitting: Family Medicine

## 2016-10-01 ENCOUNTER — Other Ambulatory Visit: Payer: Self-pay | Admitting: Family Medicine

## 2016-11-02 ENCOUNTER — Other Ambulatory Visit: Payer: Self-pay | Admitting: Family Medicine

## 2016-11-24 ENCOUNTER — Other Ambulatory Visit: Payer: Self-pay | Admitting: *Deleted

## 2016-11-25 MED ORDER — TAMSULOSIN HCL 0.4 MG PO CAPS: ORAL_CAPSULE | ORAL | 5 refills | Status: DC

## 2016-11-30 ENCOUNTER — Ambulatory Visit (INDEPENDENT_AMBULATORY_CARE_PROVIDER_SITE_OTHER): Payer: PPO

## 2016-11-30 ENCOUNTER — Ambulatory Visit (INDEPENDENT_AMBULATORY_CARE_PROVIDER_SITE_OTHER): Payer: PPO | Admitting: Family Medicine

## 2016-11-30 ENCOUNTER — Encounter: Payer: Self-pay | Admitting: Family Medicine

## 2016-11-30 VITALS — BP 139/68 | HR 65 | Temp 97.0°F | Ht 70.0 in | Wt 184.0 lb

## 2016-11-30 DIAGNOSIS — K2100 Gastro-esophageal reflux disease with esophagitis, without bleeding: Secondary | ICD-10-CM

## 2016-11-30 DIAGNOSIS — M25512 Pain in left shoulder: Secondary | ICD-10-CM | POA: Diagnosis not present

## 2016-11-30 DIAGNOSIS — N400 Enlarged prostate without lower urinary tract symptoms: Secondary | ICD-10-CM | POA: Diagnosis not present

## 2016-11-30 DIAGNOSIS — R972 Elevated prostate specific antigen [PSA]: Secondary | ICD-10-CM | POA: Diagnosis not present

## 2016-11-30 DIAGNOSIS — N529 Male erectile dysfunction, unspecified: Secondary | ICD-10-CM | POA: Diagnosis not present

## 2016-11-30 MED ORDER — MELOXICAM 15 MG PO TABS: 15.0000 mg | ORAL_TABLET | Freq: Every day | ORAL | 1 refills | Status: DC

## 2016-11-30 MED ORDER — PROSTAGLANDIN E1 POWD: 0 refills | Status: DC

## 2016-11-30 MED ORDER — CYCLOBENZAPRINE HCL 10 MG PO TABS: ORAL_TABLET | ORAL | 1 refills | Status: DC

## 2016-11-30 MED ORDER — PANTOPRAZOLE SODIUM 40 MG PO TBEC: 40.0000 mg | DELAYED_RELEASE_TABLET | Freq: Every day | ORAL | 1 refills | Status: DC

## 2016-11-30 NOTE — Progress Notes (Signed)
Subjective:  Patient ID: Philip Stone, male    DOB: Jul 01, 1950  Age: 66 y.o. MRN: [id number]  CC: Medication Refill (pt here today for routine follow up of his chronic medical conditions and also needs refills on his medication)   HPI Philip Stone presents for Follow-up BPH doing well with his tamsulosin. Still has to get up about once a night. Feels rested the next day however. Patient in for follow-up of GERD. Currently asymptomatic taking  PPI daily. There is no chest pain or heartburn. No hematemesis and no melena. No dysphagia or choking. Onset is remote. Progression is stable. Complicating factors, none. Patient has a long history of erectile dysfunction and is followed by urology. He has had no opportunity to use the alprostadil however.  Depression screen [facility] 2/9 11/30/2016 06/08/2016 12/12/2015  Decreased Interest 0 0 0  Down, Depressed, Hopeless 0 0 0  PHQ - 2 Score 0 0 0    History Philip Stone has a past medical history of Arthritis; BPH (benign prostatic hyperplasia); Foot drop, right; Neuropathy; and Platelets decreased ([place]).   He has a past surgical history that includes Cervical disc surgery; Knee arthroscopy (Right); Foot arthroplasty; Inguinal hernia repair (Right, 06/15/2014); and Insertion of mesh (Right, 06/15/2014).   His family history is not on file.He reports that he quit smoking about 22 years ago. His smoking use included Cigarettes. He started smoking about 27 years ago. He has a 18.00 pack-year smoking history. He has never used smokeless tobacco. He reports that he does not drink alcohol or use drugs.    ROS Review of Systems  Constitutional: Negative for chills, diaphoresis, fever and unexpected weight change.  HENT: Negative for congestion, hearing loss, rhinorrhea and sore throat.   Eyes: Negative for visual disturbance.  Respiratory: Negative for cough and shortness of breath.   Cardiovascular: Negative for chest pain.  Gastrointestinal: Negative for  abdominal pain, constipation and diarrhea.  Genitourinary: Negative for dysuria and flank pain.  Musculoskeletal: Positive for arthralgias (eft shoulder no known injury). Negative for joint swelling.  Skin: Negative for rash.  Neurological: Negative for dizziness and headaches.  Psychiatric/Behavioral: Negative for dysphoric mood and sleep disturbance.    Objective:  BP 139/68   Pulse 65   Temp (!) 97 F (36.1 C) (Oral)   Ht 5\' 10"  (1.778 m)   Wt 184 lb (83.5 kg)   BMI 26.40 kg/m   BP Readings from Last 3 Encounters:  11/30/16 139/68  06/08/16 131/63  12/12/15 129/67    Wt Readings from Last 3 Encounters:  11/30/16 184 lb (83.5 kg)  06/08/16 188 lb (85.3 kg)  12/12/15 190 lb 2 oz (86.2 kg)     Physical Exam  Constitutional: He is oriented to person, place, and time. He appears well-developed and well-nourished. No distress.  HENT:  Head: Normocephalic and atraumatic.  Right Ear: External ear normal.  Left Ear: External ear normal.  Nose: Nose normal.  Mouth/Throat: Oropharynx is clear and moist.  Eyes: Pupils are equal, round, and reactive to light. Conjunctivae and EOM are normal.  Neck: Normal range of motion. Neck supple. No thyromegaly present.  Cardiovascular: Normal rate, regular rhythm and normal heart sounds.   No murmur heard. Pulmonary/Chest: Effort normal and breath sounds normal. No respiratory distress. He has no wheezes. He has no rales.  Abdominal: Soft. Bowel sounds are normal. He exhibits no distension. There is no tenderness.  Lymphadenopathy:    He has no cervical adenopathy.  Neurological: He is alert and  oriented to person, place, and time. He has normal reflexes.  Skin: Skin is warm and dry.  Psychiatric: He has a normal mood and affect. His behavior is normal. Judgment and thought content normal.   X-ray of left shoulder: shows some arthritis no acute abnormality   Assessment & Plan:   Carlton was seen today for medication  refill.  Diagnoses and all orders for this visit:  Benign prostatic hyperplasia with elevated prostate specific antigen (PSA)  Gastroesophageal reflux disease with esophagitis  Erectile dysfunction, unspecified erectile dysfunction type  Left shoulder pain, unspecified chronicity -     DG Shoulder Left; Future  Other orders -     Alprostadil (PROSTAGLANDIN E1) POWD; 40 mcg/ ml, injecting 0.5-0.75 ml prn E.D. -     cyclobenzaprine (FLEXERIL) 10 MG tablet; TAKE 1 TABLET BY MOUTH AT BEDTIME--MAY INCREASE TO 3 A DAY AS NEEDED FOR STIFFNESS -     meloxicam (MOBIC) 15 MG tablet; Take 1 tablet (15 mg total) by mouth daily. For joint and muscle pain -     pantoprazole (PROTONIX) 40 MG tablet; Take 1 tablet (40 mg total) by mouth daily. For stomach       I am having Mr. Letson start on Prostaglandin E1. I am also having him maintain his tamsulosin, cyclobenzaprine, meloxicam, and pantoprazole.  Allergies as of 11/30/2016      Reactions   Asa [aspirin] Anaphylaxis   Diclofenac Other (See Comments)   Lightheaded with workout      Medication List       Accurate as of 11/30/16  5:51 PM. Always use your most recent med list.          cyclobenzaprine 10 MG tablet Commonly known as:  FLEXERIL TAKE 1 TABLET BY MOUTH AT BEDTIME--MAY INCREASE TO 3 A DAY AS NEEDED FOR STIFFNESS   meloxicam 15 MG tablet Commonly known as:  MOBIC Take 1 tablet (15 mg total) by mouth daily. For joint and muscle pain   pantoprazole 40 MG tablet Commonly known as:  PROTONIX Take 1 tablet (40 mg total) by mouth daily. For stomach   Prostaglandin E1 Powd 40 mcg/ ml, injecting 0.5-0.75 ml prn E.D.   tamsulosin 0.4 MG Caps capsule Commonly known as:  FLOMAX TAKE TWO CAPSULES BY MOUTH DAILY AT BEDTIME        Follow-up: Return in about 6 months (around 05/31/2017).  Philip Stone, M.D.

## 2017-03-31 ENCOUNTER — Encounter: Payer: Self-pay | Admitting: Family Medicine

## 2017-03-31 ENCOUNTER — Ambulatory Visit (INDEPENDENT_AMBULATORY_CARE_PROVIDER_SITE_OTHER): Payer: PPO | Admitting: Family Medicine

## 2017-03-31 ENCOUNTER — Ambulatory Visit (INDEPENDENT_AMBULATORY_CARE_PROVIDER_SITE_OTHER): Payer: PPO

## 2017-03-31 VITALS — BP 154/77 | HR 74 | Temp 97.1°F | Ht 70.0 in | Wt 190.0 lb

## 2017-03-31 DIAGNOSIS — M1710 Unilateral primary osteoarthritis, unspecified knee: Secondary | ICD-10-CM | POA: Diagnosis not present

## 2017-03-31 DIAGNOSIS — R030 Elevated blood-pressure reading, without diagnosis of hypertension: Secondary | ICD-10-CM

## 2017-03-31 DIAGNOSIS — M25561 Pain in right knee: Secondary | ICD-10-CM

## 2017-03-31 DIAGNOSIS — M179 Osteoarthritis of knee, unspecified: Secondary | ICD-10-CM | POA: Diagnosis not present

## 2017-03-31 NOTE — Progress Notes (Signed)
Subjective:  Patient ID: Philip Stone, male    DOB: [birth year]  Age: 67 y.o. MRN: [id number]  CC: Knee Pain (pt here today c/o right knee pain)   HPI Philip Stone presents for increasing discomfort in the right knee.  He notes that he cannot flex it greater than 90 degrees.  This is been getting worse for some time and is interfering with his activity significantly.  He says that sometimes when laying in bed he cannot flex it so he has to sit up by the side of the bed and force it Philip Stone to 90 degrees.  He says that he has used cyclobenzaprine in the past for this just at bedtime but lately he has had to increase it to 3 times a day and that does not seem to be helping either.  He does continue to use the meloxicam daily.  Patient states that he has been using the arthritis medicine recently and he has had significant pain.  He feels this is why his blood pressure is elevated today.  He does not have a history of high blood pressure.    Depression screen Philip Stone  Decreased Interest 0 0 0  Philip Stone, Philip Stone, Philip Stone 0 0 0  PHQ - 2 Score 0 0 0    History Philip Stone has a past medical history of Arthritis, BPH (benign prostatic hyperplasia), Foot drop, right, Neuropathy, and Platelets decreased ([place]).   He has a past surgical history that includes Cervical disc surgery; Knee arthroscopy (Right); Foot arthroplasty; Inguinal hernia repair (Right, 06/15/2014); and Insertion of mesh (Right, 06/15/2014).   His family history is not on file.He reports that he quit smoking about 22 years ago. His smoking use included cigarettes. He started smoking about 27 years ago. He has a 18.00 pack-year smoking history. he has never used smokeless tobacco. He reports that he does not drink alcohol or use drugs.    ROS Review of Systems Noncontributory Objective:  BP (!) 154/77   Pulse 74   Temp (!) 97.1 F (36.2 C) (Oral)   Ht 5\' 10"  (1.778 m)   Wt 190 lb (86.2 kg)   BMI 27.26  kg/m   BP Readings from Last 3 Encounters:  03/31/17 (!) 154/77  11/30/16 139/68  06/08/16 131/63    Wt Readings from Last 3 Encounters:  03/31/17 190 lb (86.2 kg)  11/30/16 184 lb (83.5 kg)  06/08/16 188 lb (85.3 kg)     Physical Exam  Constitutional: He appears well-developed and well-nourished. No distress.  HENT:  Head: Normocephalic and atraumatic.  Nose: Nose normal.  Cardiovascular: Normal rate and regular rhythm.  Pulmonary/Chest: Effort normal and breath sounds normal.  Musculoskeletal: He exhibits edema (Right knee) and tenderness (Noted at the left knee as well as the right.  There is restricted flexion of the right knee beyond 90 degrees.  The McMurray Lockman's and drawer signs are negative.  There is tenderness for collateral stress maneuvers but no opening of the joint line.).      Assessment & Plan:   Philip Stone was seen today for knee pain.  Diagnoses and all orders for this visit:  Right knee pain, unspecified chronicity -     DG Knee 1-2 Views Right; Future  Arthritis of knee  Elevated blood pressure reading    A steroid injection was performed at the lateral aspect of the anterior joint line using L the chloride for local anesthesia.  The joint was injected with 2.5 mL Marcaine  and and 9 mg of Celestone. This was well tolerated.    I am having Philip Stone maintain his tamsulosin, Prostaglandin E1, cyclobenzaprine, meloxicam, and pantoprazole.  Allergies as of 03/31/2017      Reactions   Asa [aspirin] Anaphylaxis   Diclofenac Other (See Comments)   Lightheaded with workout      Medication List        Accurate as of 03/31/17  7:14 PM. Always use your most recent med list.          cyclobenzaprine 10 MG tablet Commonly known as:  FLEXERIL TAKE 1 TABLET BY MOUTH AT BEDTIME--MAY INCREASE TO 3 A DAY AS NEEDED FOR STIFFNESS   meloxicam 15 MG tablet Commonly known as:  MOBIC Take 1 tablet (15 mg total) by mouth daily. For joint and muscle  pain   pantoprazole 40 MG tablet Commonly known as:  PROTONIX Take 1 tablet (40 mg total) by mouth daily. For stomach   Prostaglandin E1 Powd 40 mcg/ ml, injecting 0.5-0.75 ml prn E.D.   tamsulosin 0.4 MG Caps capsule Commonly known as:  FLOMAX TAKE TWO CAPSULES BY MOUTH DAILY AT BEDTIME      Monitor blood pressure at home.  Follow-up 3-6 months and as needed should blood pressure not drop below 140/90.   Follow-up: Return in about 2 months (around 05/29/2017) for hypertension, Arthritis.  Claretta Fraise, M.D.

## 2017-04-08 ENCOUNTER — Telehealth: Payer: Self-pay | Admitting: Family Medicine

## 2017-04-09 ENCOUNTER — Other Ambulatory Visit: Payer: Self-pay | Admitting: Family Medicine

## 2017-04-09 MED ORDER — PREDNISONE 10 MG PO TABS: ORAL_TABLET | ORAL | 0 refills | Status: DC

## 2017-04-09 NOTE — Telephone Encounter (Signed)
Please let him know I sent in a prednisone taper for 15 days. athen use meloxicam.

## 2017-04-09 NOTE — Telephone Encounter (Signed)
Left message for patient to call for questions.  Medication was sent to pharmacy to aid with his pain.

## 2017-05-04 ENCOUNTER — Ambulatory Visit (INDEPENDENT_AMBULATORY_CARE_PROVIDER_SITE_OTHER): Payer: PPO | Admitting: Family Medicine

## 2017-05-04 ENCOUNTER — Encounter: Payer: Self-pay | Admitting: Family Medicine

## 2017-05-04 DIAGNOSIS — S00451A Superficial foreign body of right ear, initial encounter: Secondary | ICD-10-CM | POA: Diagnosis not present

## 2017-05-04 NOTE — Progress Notes (Signed)
Chief Complaint  Patient presents with  . Ear Problem    pt here today because he thinks the tip of a Q-tip may be in his right ear     HPI  Patient presents today for pulling a Q-tip out of his ear recently and the white cotton tip was gone.  He denies any loss of hearing or any muffled hearing or any pain but he is concerned that the cotton tip may be lodged in the right ear.  PMH: Smoking status noted ROS: Per HPI  Objective: There were no vitals taken for this visit. Gen: NAD, alert, cooperative with exam HEENT: NCAT, EOMI, PERRL.  Both external ear canals are free of obstruction and there is no sign of a cotton or any other foreign body.  Assessment and plan:  1. Foreign body of external ear, right, initial encounter   Disproven, patient reassured  Follow up as needed.  Claretta Fraise, MD

## 2017-05-06 ENCOUNTER — Other Ambulatory Visit: Payer: Self-pay | Admitting: Family Medicine

## 2017-05-07 ENCOUNTER — Other Ambulatory Visit: Payer: Self-pay | Admitting: Family Medicine

## 2017-05-07 ENCOUNTER — Telehealth: Payer: Self-pay | Admitting: Family Medicine

## 2017-05-07 MED ORDER — PREDNISONE 10 MG PO TABS: ORAL_TABLET | ORAL | 0 refills | Status: DC

## 2017-05-07 NOTE — Telephone Encounter (Signed)
Message left that rx sent to pharmacy. ?

## 2017-05-07 NOTE — Telephone Encounter (Signed)
I sent in the requested prescription 

## 2017-05-17 ENCOUNTER — Ambulatory Visit (INDEPENDENT_AMBULATORY_CARE_PROVIDER_SITE_OTHER): Payer: PPO | Admitting: *Deleted

## 2017-05-17 ENCOUNTER — Encounter: Payer: Self-pay | Admitting: *Deleted

## 2017-05-17 VITALS — BP 126/76 | HR 62 | Ht 69.0 in | Wt 189.0 lb

## 2017-05-17 DIAGNOSIS — Z0000 Encounter for general adult medical examination without abnormal findings: Secondary | ICD-10-CM

## 2017-05-17 NOTE — Patient Instructions (Addendum)
Please work on your goal of reducing your sugar intake.  Please review information given on Advanced Directives, and if you complete these- please bring our office a copy to file in your medical record.  Please remember to schedule your yearly eye exam.    Thank you for coming in for your Annual Wellness Visit.      Preventive Care 40-64 Years, Male Preventive care refers to lifestyle choices and visits with your health care provider that can promote health and wellness. What does preventive care include?  A yearly physical exam. This is also called an annual well check.  Dental exams once or twice a year.  Routine eye exams. Ask your health care provider how often you should have your eyes checked.  Personal lifestyle choices, including: ? Daily care of your teeth and gums. ? Regular physical activity. ? Eating a healthy diet. ? Avoiding tobacco and drug use. ? Limiting alcohol use. ? Practicing safe sex. ? Taking low-dose aspirin every day starting at age 65. What happens during an annual well check? The services and screenings done by your health care provider during your annual well check will depend on your age, overall health, lifestyle risk factors, and family history of disease. Counseling Your health care provider may ask you questions about your:  Alcohol use.  Tobacco use.  Drug use.  Emotional well-being.  Home and relationship well-being.  Sexual activity.  Eating habits.  Work and work Statistician.  Screening You may have the following tests or measurements:  Height, weight, and BMI.  Blood pressure.  Lipid and cholesterol levels. These may be checked every 5 years, or more frequently if you are over 75 years old.  Skin check.  Lung cancer screening. You may have this screening every year starting at age 33 if you have a 30-pack-year history of smoking and currently smoke or have quit within the past 15 years.  Fecal occult blood test  (FOBT) of the stool. You may have this test every year starting at age 32.  Flexible sigmoidoscopy or colonoscopy. You may have a sigmoidoscopy every 5 years or a colonoscopy every 10 years starting at age 71.  Prostate cancer screening. Recommendations will vary depending on your family history and other risks.  Hepatitis C blood test.  Hepatitis B blood test.  Sexually transmitted disease (STD) testing.  Diabetes screening. This is done by checking your blood sugar (glucose) after you have not eaten for a while (fasting). You may have this done every 1-3 years.  Discuss your test results, treatment options, and if necessary, the need for more tests with your health care provider. Vaccines Your health care provider may recommend certain vaccines, such as:  Influenza vaccine. This is recommended every year.  Tetanus, diphtheria, and acellular pertussis (Tdap, Td) vaccine. You may need a Td booster every 10 years.  Varicella vaccine. You may need this if you have not been vaccinated.  Zoster vaccine. You may need this after age 66.  Measles, mumps, and rubella (MMR) vaccine. You may need at least one dose of MMR if you were born in 1957 or later. You may also need a second dose.  Pneumococcal 13-valent conjugate (PCV13) vaccine. You may need this if you have certain conditions and have not been vaccinated.  Pneumococcal polysaccharide (PPSV23) vaccine. You may need one or two doses if you smoke cigarettes or if you have certain conditions.  Meningococcal vaccine. You may need this if you have certain conditions.  Hepatitis A  vaccine. You may need this if you have certain conditions or if you travel or work in places where you may be exposed to hepatitis A.  Hepatitis B vaccine. You may need this if you have certain conditions or if you travel or work in places where you may be exposed to hepatitis B.  Haemophilus influenzae type b (Hib) vaccine. You may need this if you have  certain risk factors.  Talk to your health care provider about which screenings and vaccines you need and how often you need them. This information is not intended to replace advice given to you by your health care provider. Make sure you discuss any questions you have with your health care provider. Document Released: 02/22/2015 Document Revised: 10/16/2015 Document Reviewed: 11/27/2014 Elsevier Interactive Patient Education  [employer].

## 2017-05-18 NOTE — Progress Notes (Signed)
Subjective:   Philip Stone is a 67 y.o. male who presents for an Initial Medicare Annual Wellness Visit.  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  He has lived in [place], [place] for four years.  He enjoys riding his motorcycle, fishing, and doing home repairs part time.  His family still lives in [place] and [place].  He has an Nature conservation officer, and attends church in [place] at [employer].  He feels his health this year is the same as it was last year.  He reports no surgeries, hospitalizations, or ER visits in the past year.   Review of Systems  All negative today      Objective:    Today's Vitals   05/17/17 1520  BP: 126/76  Pulse: 62  Weight: 189 lb (85.7 kg)  Height: 5\' 9"  (1.753 m)  PainSc: 0-No pain   Body mass index is 27.91 kg/m.  Advanced Directives 05/17/2017 06/15/2014 06/12/2014  Does Patient Have a Medical Advance Directive? No No No  Would patient like information on creating a medical advance directive? Yes (ED - Information included in AVS) No - patient declined information No - patient declined information    Current Medications (verified) Outpatient Encounter Medications as of 05/17/2017  Medication Sig  . Alprostadil (PROSTAGLANDIN E1) POWD 40 mcg/ ml, injecting 0.5-0.75 ml prn E.D.  . meloxicam (MOBIC) 15 MG tablet Take 1 tablet (15 mg total) by mouth daily. For joint and muscle pain  . pantoprazole (PROTONIX) 40 MG tablet Take 1 tablet (40 mg total) by mouth daily. For stomach  . predniSONE (DELTASONE) 10 MG tablet Take 5 daily for 3 days followed by 4,3,2 and 1 for 3 days each.  . tamsulosin (FLOMAX) 0.4 MG CAPS capsule TAKE TWO CAPSULES BY MOUTH DAILY AT BEDTIME  . [DISCONTINUED] cyclobenzaprine (FLEXERIL) 10 MG tablet TAKE 1 TABLET BY MOUTH AT BEDTIME--MAY INCREASE TO 3 A DAY AS NEEDED FOR STIFFNESS (Patient not taking: Reported on 05/17/2017)   No facility-administered encounter medications on file as of 05/17/2017.      Allergies (verified) Asa [aspirin] and Diclofenac   History: Past Medical History:  Diagnosis Date  . Arthritis   . BPH (benign prostatic hyperplasia)   . Foot drop, right   . Neuropathy   . Platelets decreased ([place])    Past Surgical History:  Procedure Laterality Date  . CERVICAL DISC SURGERY    . FOOT ARTHROPLASTY    . INGUINAL HERNIA REPAIR Right 06/15/2014   Procedure: RIGHT INGUINAL HERNIORRHAPHY  WITH MESH;  Surgeon: Aviva Signs Md, MD;  Location: [facility];  Service: General;  Laterality: Right;  . INSERTION OF MESH Right 06/15/2014   Procedure: INSERTION OF MESH RIGHT INGUINAL HERNIORRHAPHY;  Surgeon: Aviva Signs Md, MD;  Location: [facility];  Service: General;  Laterality: Right;  . KNEE ARTHROSCOPY Right    No family history on file. Social History   Socioeconomic History  . Marital status: Single    Spouse name: Not on file  . Number of children: Not on file  . Years of education: Not on file  . Highest education level: Not on file  Occupational History  . Not on file  Social Needs  . Financial resource strain: Not on file  . Food insecurity:    Worry: Not on file    Inability: Not on file  . Transportation needs:    Medical: Not on file    Non-medical: Not on file  Tobacco Use  . Smoking status: Former Smoker    Packs/day: 0.50    Years: 36.00    Pack years: 18.00    Types: Cigarettes    Start date: 04/10/1989    Last attempt to quit: 04/11/1994    Years since quitting: 23.1  . Smokeless tobacco: Never Used  Substance and Sexual Activity  . Alcohol use: No    Alcohol/week: 0.0 oz  . Drug use: No  . Sexual activity: Yes    Birth control/protection: None  Lifestyle  . Physical activity:    Days per week: Not on file    Minutes per session: Not on file  . Stress: Not on file  Relationships  . Social connections:    Talks on phone: Not on file    Gets together: Not on file    Attends religious service: Not on file    Active member of club or  organization: Not on file    Attends meetings of clubs or organizations: Not on file    Relationship status: Not on file  Other Topics Concern  . Not on file  Social History Narrative  . Not on file   Tobacco Counseling Counseling given: No   Clinical Intake:     Pain Score: 0-No pain                 Activities of Daily Living In your present state of health, do you have any difficulty performing the following activities: 05/17/2017  Hearing? Y  Comment wears hearing aid in left ear  Vision? N  Difficulty concentrating or making decisions? N  Walking or climbing stairs? Y  Comment Foot drop makes going downstairs difficult   Dressing or bathing? N  Doing errands, shopping? N  Preparing Food and eating ? N  Using the Toilet? N  In the past six months, have you accidently leaked urine? N  Do you have problems with loss of bowel control? N  Managing your Medications? N  Managing your Finances? N  Housekeeping or managing your Housekeeping? N  Some recent data might be hidden     Immunizations and Health Maintenance  There is no immunization history on file for this patient. Health Maintenance Due  Topic Date Due  . Hepatitis C Screening  July 29, 1950  . HIV Screening  07/01/1967   Recommend Hepatitis C and HIV screenings at next visit with Dr. Livia Snellen  Patient Care Team: Claretta Fraise, MD as PCP - General (Family Medicine)      Assessment:   This is a routine wellness examination for Philip Stone.  Hearing/Vision screen Seen yearly at [facility] for eye exam- patient states he sees well with his glasses Wears hearing aid in left ear   Dietary issues and exercise activities discussed: Current Exercise Habits: Home exercise routine, Type of exercise: strength training/weights, Time (Minutes): 60, Frequency (Times/Week): 3, Weekly Exercise (Minutes/Week): 180, Intensity: Moderate  Goals    . DIET - REDUCE SUGAR INTAKE     Reduce chocolate chip  cookie intake in the evenings.     Patient states he eats 3 meals per day and snacks as needed.  He tries to avoid fried foods and eat vegetables daily.  He states he does eat cookies in the evening after dinner, and would like to decrease this habit.     Depression Screen PHQ 2/9 Scores 05/17/2017 05/04/2017 03/31/2017 11/30/2016  PHQ - 2 Score 0 0 0 0    Fall Risk Fall Risk  05/17/2017 05/04/2017 03/31/2017 11/30/2016 06/08/2016  Falls in the past year? No No No No No    Is the patient's home free of loose throw rugs in walkways, pet beds, electrical cords, etc?   yes      Grab bars in the bathroom? yes      Handrails on the stairs?   yes      Adequate lighting?   yes    Cognitive Function: MMSE - Mini Mental State Exam 05/18/2017  Orientation to time 4  Orientation to Place 5  Registration 3  Attention/ Calculation 3  Recall 2  Language- name 2 objects 2  Language- repeat 1  Language- follow 3 step command 3  Language- read & follow direction 1  Write a sentence 1  Copy design 1  Total score 26        Screening Tests Health Maintenance  Topic Date Due  . Hepatitis C Screening  [birth year]  . HIV Screening  07/01/1965  . INFLUENZA VACCINE  09/09/2017  . COLONOSCOPY  04/11/2018  . TETANUS/TDAP  04/11/2022    Qualifies for Shingles Vaccine? Yes, patient added to list to call when vaccine back in stock.  Cancer Screenings: Lung: Low Dose CT Chest recommended if Age 66-80 years, 30 pack-year currently smoking OR have quit w/in 15years. Patient does qualify. Colorectal: up to date       Plan:   Work on your goal of reducing your sugar intake. Review information given on Advanced Directives, and if you complete these- please bring our office a copy to file in your medical record. Remember to schedule your yearly eye exam.       I have personally reviewed and noted the following in the patient's chart:   . Medical and social history . Use of alcohol, tobacco or  illicit drugs  . Current medications and supplements . Functional ability and status . Nutritional status . Physical activity . Advanced directives . List of other physicians . Hospitalizations, surgeries, and ER visits in previous 12 months . Vitals . Screenings to include cognitive, depression, and falls . Referrals and appointments  In addition, I have reviewed and discussed with patient certain preventive protocols, quality metrics, and best practice recommendations. A written personalized care plan for preventive services as well as general preventive health recommendations were provided to patient.     Kito Cuffe M, RN   05/18/2017     I have reviewed and agree with the above AWV documentation.  Claretta Fraise, M.D.

## 2017-05-25 ENCOUNTER — Other Ambulatory Visit: Payer: Self-pay | Admitting: Family Medicine

## 2017-05-27 ENCOUNTER — Other Ambulatory Visit: Payer: Self-pay | Admitting: Family Medicine

## 2017-05-31 ENCOUNTER — Ambulatory Visit: Payer: PPO | Admitting: Family Medicine

## 2017-06-20 ENCOUNTER — Other Ambulatory Visit: Payer: Self-pay | Admitting: Family Medicine

## 2017-07-01 ENCOUNTER — Other Ambulatory Visit: Payer: Self-pay | Admitting: Family Medicine

## 2017-07-01 ENCOUNTER — Other Ambulatory Visit: Payer: Self-pay | Admitting: Family

## 2017-07-01 MED ORDER — PROSTAGLANDIN E1 POWD: 0 refills | Status: DC

## 2017-07-01 NOTE — Addendum Note (Signed)
Addended by: Antonietta Barcelona D on: 07/01/2017 03:32 PM   Modules accepted: Orders

## 2017-07-01 NOTE — Telephone Encounter (Signed)
What is the name of the medication? Prostaglandin E1 40 mcg/mi  Have you contacted your pharmacy to request a refill? YES  Which pharmacy would you like this sent to? [facility] in [place]   Patient notified that their request is being sent to the clinical staff for review and that they should receive a call once it is complete. If they do not receive a call within 24 hours they can check with their pharmacy or our office.

## 2017-07-15 ENCOUNTER — Ambulatory Visit: Payer: PPO | Admitting: Orthopaedic Surgery

## 2017-07-15 ENCOUNTER — Encounter: Payer: Self-pay | Admitting: Orthopaedic Surgery

## 2017-07-15 VITALS — BP 147/83 | HR 78 | Temp 98.1°F | Ht 70.0 in | Wt 187.0 lb

## 2017-07-15 DIAGNOSIS — M25561 Pain in right knee: Secondary | ICD-10-CM

## 2017-07-15 DIAGNOSIS — M21371 Foot drop, right foot: Secondary | ICD-10-CM | POA: Diagnosis not present

## 2017-07-15 DIAGNOSIS — G8929 Other chronic pain: Secondary | ICD-10-CM | POA: Diagnosis not present
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
## 2017-07-21 ENCOUNTER — Ambulatory Visit (HOSPITAL_COMMUNITY)
Admission: RE | Admit: 2017-07-21 | Discharge: 2017-07-21 | Disposition: A | Discharge status: Home / Self Care | Payer: PPO | Source: Ambulatory Visit | Attending: Orthopaedic Surgery | Admitting: Orthopaedic Surgery

## 2017-07-21 DIAGNOSIS — S83241A Other tear of medial meniscus, current injury, right knee, initial encounter: Secondary | ICD-10-CM | POA: Insufficient documentation

## 2017-07-21 DIAGNOSIS — M11261 Other chondrocalcinosis, right knee: Secondary | ICD-10-CM | POA: Insufficient documentation

## 2017-07-21 DIAGNOSIS — S83281A Other tear of lateral meniscus, current injury, right knee, initial encounter: Secondary | ICD-10-CM | POA: Diagnosis not present

## 2017-07-21 DIAGNOSIS — X58XXXA Exposure to other specified factors, initial encounter: Secondary | ICD-10-CM | POA: Insufficient documentation

## 2017-07-21 DIAGNOSIS — M1711 Unilateral primary osteoarthritis, right knee: Secondary | ICD-10-CM | POA: Insufficient documentation

## 2017-07-21 DIAGNOSIS — G8929 Other chronic pain: Secondary | ICD-10-CM | POA: Diagnosis not present

## 2017-07-21 DIAGNOSIS — M25561 Pain in right knee: Secondary | ICD-10-CM | POA: Insufficient documentation

## 2017-07-27 ENCOUNTER — Ambulatory Visit: Payer: PPO | Admitting: Orthopaedic Surgery

## 2017-07-27 ENCOUNTER — Encounter: Payer: Self-pay | Admitting: Orthopaedic Surgery

## 2017-07-27 VITALS — BP 160/84 | HR 54 | Temp 98.6°F | Ht 70.0 in | Wt 185.0 lb

## 2017-07-27 DIAGNOSIS — M25561 Pain in right knee: Secondary | ICD-10-CM | POA: Diagnosis not present

## 2017-07-27 DIAGNOSIS — G8929 Other chronic pain: Secondary | ICD-10-CM

## 2017-07-27 NOTE — Progress Notes (Signed)
Patient GQ:[id number] Redder, male DOB:1950-08-11, 67 y.o. DTO:[id number]  Chief Complaint  Patient presents with  . Knee Pain    MRI results of right knee.    HPI  Philip Stone is a 67 y.o. male who has pain of the right knee. He had MRI done and it showed: IMPRESSION: Dominant finding is advanced osteoarthritis about the knee appearing worst in the lateral and patellofemoral compartments.  Degenerative maceration anterior horn lateral meniscus. Complex tear posterior horn lateral meniscus is also seen.  Horizontal tear posterior horn medial meniscus.  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  I have explained the findings to him.  I have recommended he see Dr. Aline Brochure for possible surgery.  He agrees. HPI  Body mass index is 26.54 kg/m.  ROS  Review of Systems  Constitutional: Positive for activity change.  Respiratory: Negative for cough and shortness of breath.   Cardiovascular: Negative for chest pain and leg swelling.  Musculoskeletal: Positive for arthralgias, gait problem and joint swelling.  All other systems reviewed and are negative.   Past Medical History:  Diagnosis Date  . Arthritis   . BPH (benign prostatic hyperplasia)   . Foot drop, right   . Neuropathy   . Platelets decreased ([place])     Past Surgical History:  Procedure Laterality Date  . CERVICAL DISC SURGERY    . FOOT ARTHROPLASTY    . INGUINAL HERNIA REPAIR Right 06/15/2014   Procedure: RIGHT INGUINAL HERNIORRHAPHY  WITH MESH;  Surgeon: Aviva Signs Md, MD;  Location: [facility];  Service: General;  Laterality: Right;  . INSERTION OF MESH Right 06/15/2014   Procedure: INSERTION OF MESH RIGHT INGUINAL HERNIORRHAPHY;  Surgeon: Aviva Signs Md, MD;  Location: [facility];  Service: General;  Laterality: Right;  . KNEE ARTHROSCOPY Right     History reviewed. No pertinent family history.  Social History Social History   Tobacco Use  . Smoking status: Former Smoker    Packs/day: 0.50    Years: 36.00    Pack years: 18.00    Types: Cigarettes    Start date: 04/10/1989    Last attempt to quit: 04/11/1994    Years since quitting: 23.3  . Smokeless tobacco: Never Used  Substance Use Topics  . Alcohol use: No    Alcohol/week: 0.0 oz  . Drug use: No    Allergies  Allergen Reactions  . Asa [Aspirin] Anaphylaxis  . Prednisone     Makes him lightheaded    Current Outpatient Medications  Medication Sig Dispense Refill  . Alprostadil (PROSTAGLANDIN E1) POWD 40 mcg/ ml, injecting 0.5-0.75 ml prn E.D. (Patient not taking: Reported on 07/15/2017) 1 g 0  . meloxicam (MOBIC) 15 MG tablet TAKE 1 TABLET (15 MG TOTAL) BY MOUTH DAILY. FOR JOINT AND MUSCLE PAIN 90 tablet 0  . pantoprazole (PROTONIX) 40 MG tablet TAKE 1 TABLET (40 MG TOTAL) BY MOUTH DAILY. FOR STOMACH 90 tablet 0  . predniSONE (DELTASONE) 10 MG tablet Take 5 daily for 3 days followed by 4,3,2 and 1 for 3 days each. (Patient not taking: Reported on 07/15/2017) 45 tablet 0  . tamsulosin (FLOMAX) 0.4 MG CAPS capsule TAKE TWO CAPSULES BY MOUTH DAILY AT BEDTIME 60 capsule 4   No current facility-administered medications for this visit.      Physical Exam  Blood pressure (!) 160/84, pulse (!) 54, temperature 98.6 F (37 C), height 5\' 10"  (1.778 m), weight 185 lb (83.9 kg).  Constitutional: overall normal hygiene, normal  nutrition, well developed, normal grooming, normal body habitus. Assistive device:none  Musculoskeletal: gait and station Limp none, muscle tone and strength are normal, no tremors or atrophy is present.  .  Neurological: coordination overall normal.  Deep tendon reflex/nerve stretch intact.  Sensation normal.  Cranial nerves II-XII intact.   Skin:   Normal overall no scars, lesions, ulcers or rashes. No psoriasis.  Psychiatric: Alert and oriented x 3.  Recent memory intact, remote memory unclear.  Normal mood and affect. Well groomed.  Good eye contact.  Cardiovascular: overall no  swelling, no varicosities, no edema bilaterally, normal temperatures of the legs and arms, no clubbing, cyanosis and good capillary refill.  Lymphatic: palpation is normal.  He has limp to the right, crepitus of the right knee, positive medial McMurray, effusion 1+.  All other systems reviewed and are negative   The patient has been educated about the nature of the problem(s) and counseled on treatment options.  The patient appeared to understand what I have discussed and is in agreement with it.  Encounter Diagnosis  Name Primary?  . Chronic pain of right knee Yes    PLAN Call if any problems.  Precautions discussed.  Continue current medications.   Return to clinic to see Dr. Aline Brochure   Electronically Signed Sanjuana Kava, MD 6/18/20198:45 AM

## 2017-08-02 ENCOUNTER — Encounter: Payer: Self-pay | Admitting: Orthopedic Surgery

## 2017-08-02 ENCOUNTER — Ambulatory Visit: Payer: PPO | Admitting: Orthopedic Surgery

## 2017-08-02 VITALS — BP 145/74 | HR 64 | Ht 70.0 in | Wt 185.0 lb

## 2017-08-02 DIAGNOSIS — G5793 Unspecified mononeuropathy of bilateral lower limbs: Secondary | ICD-10-CM | POA: Diagnosis not present

## 2017-08-02 DIAGNOSIS — M23300 Other meniscus derangements, unspecified lateral meniscus, right knee: Secondary | ICD-10-CM | POA: Diagnosis not present

## 2017-08-02 DIAGNOSIS — M1711 Unilateral primary osteoarthritis, right knee: Secondary | ICD-10-CM

## 2017-08-02 DIAGNOSIS — M23303 Other meniscus derangements, unspecified medial meniscus, right knee: Secondary | ICD-10-CM

## 2017-08-02 DIAGNOSIS — M21371 Foot drop, right foot: Secondary | ICD-10-CM

## 2017-08-02 NOTE — Progress Notes (Signed)
PREOP CONSULT/REFERRAL INTRA-OFFICE FROM DR Tyrone Apple   Chief Complaint  Patient presents with  . Knee Pain    right knee pain/ surgical consult per Dr Luna Glasgow    MEDICAL DECISION SECTION  xrays ordered? no  My independent reading of xrays: The MRI shows medial and lateral meniscal tears and extensive cartilage loss in the patellofemoral joint lateral compartment and mild cartilage loss medial compartment is x-rays show degenerative arthritis with joint space narrowing and osteophyte formation throughout his knee   Encounter Diagnoses  Name Primary?  . 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 Primary osteoarthritis of right knee      PLAN:   Surgical procedure planned: We discussed arthroscopic versus total knee surgery right knee.  In my opinion we should just go ahead and do a right total knee replacement.  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  When he returns we will do a preop for a right total knee replacement he is advised to have his girlfriend be aware that she will need to stay with him for approximately 1 week after the surgery  The procedure has been fully reviewed with the patient; The risks and benefits of surgery have been discussed and explained and understood. Alternative treatment has also been reviewed, questions were encouraged and answered. The postoperative plan is also been reviewed.  Nonsurgical treatment as described in the history and physical section was attempted and unsuccessful and the patient has agreed to proceed with surgical intervention to improve their situation.  No orders of the defined types were placed in this encounter.   Chief Complaint  Patient presents with  . Knee Pain    right knee pain/ surgical consult per Dr Luna Glasgow     67 year old male with some type of neuropathy for evaluation of possible knee  replacement or arthroscopy  He 67 years old he complains of diffuse dull aching right knee pain associated with difficulties with activities of daily living.  He says he moved from [place] 4 years ago he was seen at [facility] for some type of neuropathy he has an ankle AFO on the right and then he has a supportive brace on the left.  He says he has multiple family members with the same problem he has extensive high arches status post reconstructive surgery on the left foot and ankle.   He says he has had multiple injections including steroids and hyaluronic acid he has had a course of NSAID therapy and has not improved.   Review of Systems  Neurological: Positive for focal weakness. Negative for tingling.     Past Medical History:  Diagnosis Date  . Arthritis   . BPH (benign prostatic hyperplasia)   . Foot drop, right   . Neuropathy   . Platelets decreased ([place])     Past Surgical History:  Procedure Laterality Date  . CERVICAL DISC SURGERY    . FOOT ARTHROPLASTY    . INGUINAL HERNIA REPAIR Right 06/15/2014   Procedure: RIGHT INGUINAL HERNIORRHAPHY  WITH MESH;  Surgeon: Aviva Signs Md, MD;  Location: [facility];  Service: General;  Laterality: Right;  . INSERTION OF MESH Right 06/15/2014   Procedure: INSERTION OF MESH RIGHT INGUINAL HERNIORRHAPHY;  Surgeon: Aviva Signs Md, MD;  Location: [facility];  Service: General;  Laterality: Right;  . KNEE ARTHROSCOPY Right     History reviewed. No pertinent family history.  Social History   Tobacco Use  . Smoking status: Former Smoker    Packs/day: 0.50    Years: 36.00    Pack years: 18.00    Types: Cigarettes    Start date: 04/10/1989    Last attempt to quit: 04/11/1994    Years since quitting: 23.3  . Smokeless tobacco: Never Used  Substance Use Topics  . Alcohol use: No    Alcohol/week: 0.0 oz  . Drug use: No    Allergies  Allergen Reactions  . Asa [Aspirin] Anaphylaxis  . Prednisone     Makes him lightheaded     Current Meds   Medication Sig  . Alprostadil (PROSTAGLANDIN E1) POWD 40 mcg/ ml, injecting 0.5-0.75 ml prn E.D.  . meloxicam (MOBIC) 15 MG tablet TAKE 1 TABLET (15 MG TOTAL) BY MOUTH DAILY. FOR JOINT AND MUSCLE PAIN  . pantoprazole (PROTONIX) 40 MG tablet TAKE 1 TABLET (40 MG TOTAL) BY MOUTH DAILY. FOR STOMACH  . predniSONE (DELTASONE) 10 MG tablet Take 5 daily for 3 days followed by 4,3,2 and 1 for 3 days each.  . tamsulosin (FLOMAX) 0.4 MG CAPS capsule TAKE TWO CAPSULES BY MOUTH DAILY AT BEDTIME    BP (!) 145/74   Pulse 64   Ht 5\' 10"  (1.778 m)   Wt 185 lb (83.9 kg)   BMI 26.54 kg/m    Physical Exam  Constitutional: He is oriented to person, place, and time. He appears well-developed and well-nourished.  Vital signs have been reviewed and are stable. Gen. appearance the patient is well-developed and well-nourished with normal grooming and hygiene.   Neurological: He is alert and oriented to person, place, and time.  Skin: Skin is warm and dry. No erythema.  Psychiatric: He has a normal mood and affect.  Vitals reviewed.   Ortho Exam  Gait is remarkable for what appears to be fairly normal heel toe gait but when careful observation he seems to throw his feet out in front of him probably related to his abnormal dorsiflexion  Right knee effusion.  Alignment looks normal.  Total arc of flexion 115 degrees extension is just about normal there is no instability quadricep strength is normal skin is intact no erythema or laceration distal pulses are intact he has normal soft touch no Ernestina Patches testing was done  He has a high arch on the right and left ankle.  He has weak dorsiflexion on the right foot    On the left knee we find a normal-appearing knee with normal alignment range of motion looks fine stability is good strength is normal skin is normal and pulses normal  Arther Abbott, MD 08/02/2017 1:41 PM

## 2017-08-04 ENCOUNTER — Telehealth: Payer: Self-pay | Admitting: Radiology

## 2017-08-04 NOTE — Telephone Encounter (Signed)
Patient has declined appointment with Neurologist

## 2017-08-16 DIAGNOSIS — S83281A Other tear of lateral meniscus, current injury, right knee, initial encounter: Secondary | ICD-10-CM | POA: Diagnosis not present

## 2017-08-16 DIAGNOSIS — S83241A Other tear of medial meniscus, current injury, right knee, initial encounter: Secondary | ICD-10-CM | POA: Diagnosis not present

## 2017-08-16 DIAGNOSIS — M1711 Unilateral primary osteoarthritis, right knee: Secondary | ICD-10-CM | POA: Diagnosis not present

## 2017-08-21 ENCOUNTER — Other Ambulatory Visit: Payer: Self-pay | Admitting: Family Medicine

## 2017-08-23 ENCOUNTER — Other Ambulatory Visit: Payer: Self-pay | Admitting: Family Medicine

## 2017-08-31 ENCOUNTER — Telehealth: Payer: Self-pay | Admitting: Orthopedic Surgery

## 2017-08-31 NOTE — Telephone Encounter (Signed)
Patient called while we were at lunch.  He left a message stating he wanted to cancel the appointment with Dr. Aline Brochure on August 26th at 11:20.    I called Philip Stone back and asked him if he wanted to reschedule this appointment and he said he did not.  He will call us back if he changes his mind.

## 2017-09-19 ENCOUNTER — Other Ambulatory Visit: Payer: Self-pay | Admitting: Family Medicine

## 2017-10-04 ENCOUNTER — Ambulatory Visit: Payer: PPO | Admitting: Orthopedic Surgery

## 2017-10-13 DIAGNOSIS — R972 Elevated prostate specific antigen [PSA]: Secondary | ICD-10-CM | POA: Diagnosis not present

## 2017-10-20 ENCOUNTER — Ambulatory Visit (INDEPENDENT_AMBULATORY_CARE_PROVIDER_SITE_OTHER): Payer: PPO | Admitting: Family Medicine

## 2017-10-20 ENCOUNTER — Encounter: Payer: Self-pay | Admitting: Family Medicine

## 2017-10-20 VITALS — BP 138/69 | HR 65 | Temp 98.5°F | Ht 70.0 in | Wt 186.5 lb

## 2017-10-20 DIAGNOSIS — M2391 Unspecified internal derangement of right knee: Secondary | ICD-10-CM | POA: Diagnosis not present

## 2017-10-20 DIAGNOSIS — R3912 Poor urinary stream: Secondary | ICD-10-CM | POA: Diagnosis not present

## 2017-10-20 DIAGNOSIS — R972 Elevated prostate specific antigen [PSA]: Secondary | ICD-10-CM | POA: Diagnosis not present

## 2017-10-20 DIAGNOSIS — N401 Enlarged prostate with lower urinary tract symptoms: Secondary | ICD-10-CM | POA: Diagnosis not present

## 2017-10-20 DIAGNOSIS — N5201 Erectile dysfunction due to arterial insufficiency: Secondary | ICD-10-CM | POA: Diagnosis not present

## 2017-10-20 MED ORDER — PANTOPRAZOLE SODIUM 40 MG PO TBEC: 40.0000 mg | DELAYED_RELEASE_TABLET | Freq: Every day | ORAL | 0 refills | Status: DC

## 2017-10-20 MED ORDER — TAMSULOSIN HCL 0.4 MG PO CAPS: ORAL_CAPSULE | ORAL | 0 refills | Status: DC

## 2017-10-20 MED ORDER — BACLOFEN 5 MG PO TABS: 5.0000 mg | ORAL_TABLET | Freq: Four times a day (QID) | ORAL | 1 refills | Status: DC

## 2017-10-20 MED ORDER — MELOXICAM 15 MG PO TABS: 15.0000 mg | ORAL_TABLET | Freq: Every day | ORAL | 0 refills | Status: DC

## 2017-10-20 NOTE — Progress Notes (Signed)
Chief Complaint  Patient presents with  . Medical Management of Chronic Issues    HPI  Patient presents today for continued pain and stiffness in the right knee.  He has been diagnosed with Ozella Almond disease he says I asked him if it was Charcot Lelan Pons and he is not sure.  He is scheduled to see a neurologist regarding this condition because he keeps up and having spasms that forcefully flex the knee.  This needs to be addressed before he can have his knee replacement.  PMH: Smoking status noted ROS: Per HPI  Objective: BP 138/69   Pulse 65   Temp 98.5 F (36.9 C) (Oral)   Ht 5\' 10"  (1.778 m)   Wt 186 lb 8 oz (84.6 kg)   BMI 26.76 kg/m  Gen: NAD, alert, cooperative with exam HEENT: NCAT, EOMI, PERRL CV: RRR, good S1/S2, no murmur Resp: CTABL, no wheezes, non-labored Abd: SNTND, BS present, no guarding or organomegaly Ext: No edema, warm.  The knee joint is rather stiff he is wearing braces on both lower extremities.  There is some wasting of the calves noted. Neuro: Alert and oriented, No gross deficits  Assessment and plan:  No diagnosis found.  Meds ordered this encounter  Medications  . Baclofen 5 MG TABS    Sig: Take 5 mg by mouth 4 (four) times daily.    Dispense:  120 tablet    Refill:  1  . meloxicam (MOBIC) 15 MG tablet    Sig: Take 1 tablet (15 mg total) by mouth daily. For joint and muscle pain    Dispense:  90 tablet    Refill:  0  . pantoprazole (PROTONIX) 40 MG tablet    Sig: Take 1 tablet (40 mg total) by mouth daily. For stomach    Dispense:  90 tablet    Refill:  0  . tamsulosin (FLOMAX) 0.4 MG CAPS capsule    Sig: TAKE TWO CAPSULES BY MOUTH DAILY AT BEDTIME    Dispense:  180 capsule    Refill:  0    No orders of the defined types were placed in this encounter.   Follow up as needed.  Claretta Fraise, MD

## 2017-11-16 ENCOUNTER — Ambulatory Visit (INDEPENDENT_AMBULATORY_CARE_PROVIDER_SITE_OTHER): Payer: PPO | Admitting: Neurology

## 2017-11-16 ENCOUNTER — Encounter: Payer: Self-pay | Admitting: Neurology

## 2017-11-16 ENCOUNTER — Telehealth: Payer: Self-pay | Admitting: Neurology

## 2017-11-16 VITALS — BP 181/93 | HR 75 | Ht 70.0 in | Wt 186.5 lb

## 2017-11-16 DIAGNOSIS — M4802 Spinal stenosis, cervical region: Secondary | ICD-10-CM | POA: Diagnosis not present

## 2017-11-16 DIAGNOSIS — G600 Hereditary motor and sensory neuropathy: Secondary | ICD-10-CM | POA: Diagnosis not present

## 2017-11-16 NOTE — Telephone Encounter (Signed)
Health team auth: [place] patient is scheduled at [facility] for 12/06/17 arrival time is 2:45 pm patient is aware of time he also has their number of [phone] incase he needed to r/s for any reason.
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
## 2017-11-22 DIAGNOSIS — M1711 Unilateral primary osteoarthritis, right knee: Secondary | ICD-10-CM | POA: Diagnosis not present

## 2017-11-30 ENCOUNTER — Ambulatory Visit: Payer: Self-pay | Admitting: Orthopedic Surgery

## 2017-12-06 ENCOUNTER — Ambulatory Visit (HOSPITAL_COMMUNITY)
Admission: RE | Admit: 2017-12-06 | Discharge: 2017-12-06 | Disposition: A | Discharge status: Home / Self Care | Payer: PPO | Source: Ambulatory Visit | Attending: Neurology | Admitting: Neurology

## 2017-12-06 DIAGNOSIS — G600 Hereditary motor and sensory neuropathy: Secondary | ICD-10-CM

## 2017-12-06 DIAGNOSIS — M4802 Spinal stenosis, cervical region: Secondary | ICD-10-CM | POA: Insufficient documentation

## 2017-12-06 DIAGNOSIS — M50223 Other cervical disc displacement at C6-C7 level: Secondary | ICD-10-CM | POA: Diagnosis not present

## 2017-12-07 ENCOUNTER — Telehealth: Payer: Self-pay | Admitting: Neurology

## 2017-12-07 NOTE — Telephone Encounter (Signed)
Spoke to patient and notified him of the results below.  He verbalized understanding.

## 2017-12-07 NOTE — Telephone Encounter (Signed)
Please call patient, CT of the cervical spine showed evidence of previous C4-5, C5-6 posterior decompression and fusion, with variable degree of foraminal narrowing, there was no significant spinal canal stenosis, I will review films with him at next follow-up visit.  Prior C4-5 and C5-6 posterior decompression and fusion.  Cervical spondylotic changes resulting in:  C1-2 slight narrowing ventral thecal sac.  C2-3 minimal narrowing ventral thecal sac.  C3-4 mild to slightly moderate right-sided and minimal left-sided foraminal narrowing. Ventral thecal sac narrowing greater on the right.  C4-5 no significant spinal stenosis or foraminal narrowing.  C5-6 moderate left foraminal narrowing.  C6-7 moderate bilateral foraminal narrowing. Spinal stenosis and mild central cord flattening.  C7-T1 mild ventral thecal sac narrowing. Minimal left foraminal narrowing.  Please see above for further detail.  Minimal to mild carotid bifurcation calcifications.

## 2017-12-29 DIAGNOSIS — M1711 Unilateral primary osteoarthritis, right knee: Secondary | ICD-10-CM | POA: Diagnosis not present

## 2017-12-29 NOTE — H&P (Signed)
TOTAL KNEE ADMISSION H&P  Patient is being admitted for right total knee arthroplasty.  Subjective:  Chief Complaint:right knee pain.  HPI: Philip Stone, 67 y.o. male, has a history of pain and functional disability in the right knee due to arthritis and has failed non-surgical conservative treatments for greater than 12 weeks to includeuse of assistive devices, weight reduction as appropriate and activity modification.  Onset of symptoms was gradual, starting 5 years ago with gradually worsening course since that time. The patient noted no past surgery on the right knee(s).  Patient currently rates pain in the right knee(s) at 8 out of 10 with activity. Patient has worsening of pain with activity and weight bearing, pain that interferes with activities of daily living, pain with passive range of motion and joint swelling.  Patient has evidence of subchondral sclerosis, periarticular osteophytes and joint space narrowing by imaging studies. There is no active infection.  Patient Active Problem List   Diagnosis Date Noted  . Cervical stenosis of spinal canal 11/16/2017  . 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 Foot drop, right 07/15/2017  . Gastroesophageal reflux disease with esophagitis 11/30/2016  . Benign prostatic hyperplasia with elevated prostate specific antigen (PSA) 11/30/2016   Past Medical History:  Diagnosis Date  . Arthritis   . BPH (benign prostatic hyperplasia)   . Charcot-Marie-Tooth disease   . Foot drop, right   . Neuropathy   . Platelets decreased ([place])   . Spinal stenosis     Past Surgical History:  Procedure Laterality Date  . CERVICAL DISC SURGERY    . FOOT ARTHROPLASTY    . INGUINAL HERNIA REPAIR Right 06/15/2014   Procedure: RIGHT INGUINAL HERNIORRHAPHY  WITH MESH;  Surgeon: Aviva Signs Md, MD;  Location: [facility];  Service: General;  Laterality: Right;  . INSERTION OF MESH Right 06/15/2014   Procedure: INSERTION OF MESH RIGHT INGUINAL HERNIORRHAPHY;   Surgeon: Aviva Signs Md, MD;  Location: [facility];  Service: General;  Laterality: Right;  . KNEE ARTHROSCOPY Right     No current facility-administered medications for this encounter.    Current Outpatient Medications  Medication Sig Dispense Refill Last Dose  . meloxicam (MOBIC) 15 MG tablet Take 1 tablet (15 mg total) by mouth daily. For joint and muscle pain 90 tablet 0 Taking  . pantoprazole (PROTONIX) 40 MG tablet Take 1 tablet (40 mg total) by mouth daily. For stomach 90 tablet 0 Taking  . tamsulosin (FLOMAX) 0.4 MG CAPS capsule TAKE TWO CAPSULES BY MOUTH DAILY AT BEDTIME 180 capsule 0 Taking   Allergies  Allergen Reactions  . Asa [Aspirin] Anaphylaxis  . Prednisone     Makes him lightheaded    Social History   Tobacco Use  . Smoking status: Former Smoker    Packs/day: 0.50    Years: 36.00    Pack years: 18.00    Types: Cigarettes    Start date: 04/10/1989    Last attempt to quit: 2008    Years since quitting: 11.8  . Smokeless tobacco: Never Used  Substance Use Topics  . Alcohol use: No    Alcohol/week: 0.0 standard drinks    Comment: none since 2005    Family History  Problem Relation Age of Onset  . Suicidality Mother   . Other Father        "old age"     Review of Systems  Constitutional: Negative.   HENT: Negative.   Eyes: Negative.   Respiratory: Negative.   Cardiovascular: Negative.  Gastrointestinal: Negative.   Genitourinary: Negative.        Hypospadias.  Musculoskeletal: Positive for joint pain.  Skin: Negative.   Neurological: Negative.   Endo/Heme/Allergies: Negative.   Psychiatric/Behavioral: Negative.     Objective:  Physical Exam  Constitutional: He is oriented to person, place, and time. He appears well-developed.  HENT:  Head: Normocephalic.  Eyes: EOM are normal.  Neck: Normal range of motion.  Cardiovascular: Normal rate and intact distal pulses.  Murmur heard. Respiratory: Effort normal.  GI: Soft.  Genitourinary:   Genitourinary Comments: Deferred  Musculoskeletal:  Limited ROM. Crepitus. Knee is stable at varus and valgus stress.  Neurological: He is alert and oriented to person, place, and time.  Skin: Skin is warm and dry.  Psychiatric: His behavior is normal.    Vital signs in last 24 hours: BP: ()/()  Arterial Line BP: ()/()   Labs:   Estimated body mass index is 26.76 kg/m as calculated from the following:   Height as of 11/16/17: 5\' 10"  (1.778 m).   Weight as of 11/16/17: 84.6 kg.   Imaging Review Plain radiographs demonstrate severe degenerative joint disease of the right knee(s). The overall alignment ismild varus. The bone quality appears to be good for age and reported activity level.   Preoperative templating of the joint replacement has been completed, documented, and submitted to the Operating Room personnel in order to optimize intra-operative equipment management.   Anticipated LOS equal to or greater than 2 midnights due to - Age 32 and older with one or more of the following:  - Obesity  - Expected need for hospital services (PT, OT, Nursing) required for safe  discharge  - Anticipated need for postoperative skilled nursing care or inpatient rehab  - Active co-morbidities: None OR   - Unanticipated findings during/Post Surgery: None  - Patient is a high risk of re-admission due to: None     Assessment/Plan:  End stage arthritis, right knee   The patient history, physical examination, clinical judgment of the provider and imaging studies are consistent with end stage degenerative joint disease of the right knee(s) and total knee arthroplasty is deemed medically necessary. The treatment options including medical management, injection therapy arthroscopy and arthroplasty were discussed at length. The risks and benefits of total knee arthroplasty were presented and reviewed. The risks due to aseptic loosening, infection, stiffness, patella tracking problems,  thromboembolic complications and other imponderables were discussed. The patient acknowledged the explanation, agreed to proceed with the plan and consent was signed. Patient is being admitted for inpatient treatment for surgery, pain control, PT, OT, prophylactic antibiotics, VTE prophylaxis, progressive ambulation and ADL's and discharge planning. The patient is planning to be discharged home with home health services.  Will use IV tranexamic acid. Contraindications and adverse affects of Tranexamic acid discussed in detail. Patient denies any of these at this time and understands the risks and benefits.

## 2018-01-14 ENCOUNTER — Other Ambulatory Visit: Payer: Self-pay | Admitting: Family Medicine

## 2018-01-17 ENCOUNTER — Other Ambulatory Visit: Payer: Self-pay | Admitting: Family Medicine

## 2018-01-17 NOTE — Progress Notes (Signed)
Clearance Dr Claretta Fraise on chart   LOV neuro Dr Krista Blue 11-16-17 on chart

## 2018-01-17 NOTE — Telephone Encounter (Signed)
Last seen 10/20/17  Dr [employer]

## 2018-01-17 NOTE — Patient Instructions (Signed)
Philip Stone  01/17/2018   Your procedure is scheduled on: 01-21-18   Report to [facility] Main  Entrance     Report to admitting at 7:45AM    Call this number if you have problems the morning of surgery [phone]      Remember: Do not eat food or drink liquids :After Midnight. BRUSH YOUR TEETH MORNING OF SURGERY AND RINSE YOUR MOUTH OUT, NO CHEWING GUM CANDY OR MINTS.     Take these medicines the morning of surgery with A SIP OF WATER: Protonix, Tamsulosin  If needed                                You may not have any metal on your body including hair pins and              piercings  Do not wear jewelry, make-up, lotions, powders or perfumes, deodorant                        Men may shave face and neck.   Do not bring valuables to the hospital. [place].  Contacts, dentures or bridgework may not be worn into surgery.  Leave suitcase in the car. After surgery it may be brought to your room.                   Please read over the following fact sheets you were given: _____________________________________________________________________               [facility] - Preparing for Surgery Before surgery, you can play an important role.  Because skin is not sterile, your skin needs to be as free of germs as possible.  You can reduce the number of germs on your skin by washing with CHG (chlorahexidine gluconate) soap before surgery.  CHG is an antiseptic cleaner which kills germs and bonds with the skin to continue killing germs even after washing. Please DO NOT use if you have an allergy to CHG or antibacterial soaps.  If your skin becomes reddened/irritated stop using the CHG and inform your nurse when you arrive at Short Stay. Do not shave (including legs and underarms) for at least 48 hours prior to the first CHG shower.  You may shave your face/neck. Please follow these instructions  carefully:  1.  Shower with CHG Soap the night before surgery and the  morning of Surgery.  2.  If you choose to wash your hair, wash your hair first as usual with your  normal  shampoo.  3.  After you shampoo, rinse your hair and body thoroughly to remove the  shampoo.                           4.  Use CHG as you would any other liquid soap.  You can apply chg directly  to the skin and wash                       Gently with a scrungie or clean washcloth.  5.  Apply the CHG Soap to your body ONLY FROM THE NECK DOWN.   Do not use  on face/ open                           Wound or open sores. Avoid contact with eyes, ears mouth and genitals (private parts).                       Wash face,  Genitals (private parts) with your normal soap.             6.  Wash thoroughly, paying special attention to the area where your surgery  will be performed.  7.  Thoroughly rinse your body with warm water from the neck down.  8.  DO NOT shower/wash with your normal soap after using and rinsing off  the CHG Soap.                9.  Pat yourself dry with a clean towel.            10.  Wear clean pajamas.            11.  Place clean sheets on your bed the night of your first shower and do not  sleep with pets. Day of Surgery : Do not apply any lotions/deodorants the morning of surgery.  Please wear clean clothes to the hospital/surgery center.  FAILURE TO FOLLOW THESE INSTRUCTIONS MAY RESULT IN THE CANCELLATION OF YOUR SURGERY PATIENT SIGNATURE_________________________________  NURSE SIGNATURE__________________________________  ________________________________________________________________________   Philip Stone  An incentive spirometer is a tool that can help keep your lungs clear and active. This tool measures how well you are filling your lungs with each breath. Taking long deep breaths may help reverse or decrease the chance of developing breathing (pulmonary) problems (especially infection)  following:  A long period of time when you are unable to move or be active. BEFORE THE PROCEDURE   If the spirometer includes an indicator to show your best effort, your nurse or respiratory therapist will set it to a desired goal.  If possible, sit up straight or lean slightly forward. Try not to slouch.  Hold the incentive spirometer in an upright position. INSTRUCTIONS FOR USE  1. Sit on the edge of your bed if possible, or sit up as far as you can in bed or on a chair. 2. Hold the incentive spirometer in an upright position. 3. Breathe out normally. 4. Place the mouthpiece in your mouth and seal your lips tightly around it. 5. Breathe in slowly and as deeply as possible, raising the piston or the ball toward the top of the column. 6. Hold your breath for 3-5 seconds or for as long as possible. Allow the piston or ball to fall to the bottom of the column. 7. Remove the mouthpiece from your mouth and breathe out normally. 8. Rest for a few seconds and repeat Steps 1 through 7 at least 10 times every 1-2 hours when you are awake. Take your time and take a few normal breaths between deep breaths. 9. The spirometer may include an indicator to show your best effort. Use the indicator as a goal to work toward during each repetition. 10. After each set of 10 deep breaths, practice coughing to be sure your lungs are clear. If you have an incision (the cut made at the time of surgery), support your incision when coughing by placing a pillow or rolled up towels firmly against it. Once you are able to get out of bed, walk around  indoors and cough well. You may stop using the incentive spirometer when instructed by your caregiver.  RISKS AND COMPLICATIONS  Take your time so you do not get dizzy or light-headed.  If you are in pain, you may need to take or ask for pain medication before doing incentive spirometry. It is harder to take a deep breath if you are having pain. AFTER USE  Rest and  breathe slowly and easily.  It can be helpful to keep track of a log of your progress. Your caregiver can provide you with a simple table to help with this. If you are using the spirometer at home, follow these instructions: [place] IF:   You are having difficultly using the spirometer.  You have trouble using the spirometer as often as instructed.  Your pain medication is not giving enough relief while using the spirometer.  You develop fever of 100.5 F (38.1 C) or higher. SEEK IMMEDIATE MEDICAL CARE IF:   You cough up bloody sputum that had not been present before.  You develop fever of 102 F (38.9 C) or greater.  You develop worsening pain at or near the incision site. MAKE SURE YOU:   Understand these instructions.  Will watch your condition.  Will get help right away if you are not doing well or get worse. Document Released: 06/08/2006 Document Revised: 04/20/2011 Document Reviewed: 08/09/2006 [facility] Patient Information 2014 [place], [place].   ________________________________________________________________________

## 2018-01-19 ENCOUNTER — Other Ambulatory Visit: Payer: Self-pay

## 2018-01-19 ENCOUNTER — Encounter (HOSPITAL_COMMUNITY)
Admission: RE | Admit: 2018-01-19 | Discharge: 2018-01-19 | Disposition: A | Discharge status: Home / Self Care | Payer: PPO | Source: Ambulatory Visit | Attending: Specialist | Admitting: Specialist

## 2018-01-19 ENCOUNTER — Encounter (HOSPITAL_COMMUNITY): Payer: Self-pay

## 2018-01-19 DIAGNOSIS — G8918 Other acute postprocedural pain: Secondary | ICD-10-CM | POA: Diagnosis not present

## 2018-01-19 DIAGNOSIS — N400 Enlarged prostate without lower urinary tract symptoms: Secondary | ICD-10-CM | POA: Diagnosis present

## 2018-01-19 DIAGNOSIS — M25761 Osteophyte, right knee: Secondary | ICD-10-CM | POA: Diagnosis present

## 2018-01-19 DIAGNOSIS — Z886 Allergy status to analgesic agent status: Secondary | ICD-10-CM | POA: Diagnosis not present

## 2018-01-19 DIAGNOSIS — M1711 Unilateral primary osteoarthritis, right knee: Secondary | ICD-10-CM

## 2018-01-19 DIAGNOSIS — R972 Elevated prostate specific antigen [PSA]: Secondary | ICD-10-CM | POA: Diagnosis present

## 2018-01-19 DIAGNOSIS — Z01812 Encounter for preprocedural laboratory examination: Secondary | ICD-10-CM | POA: Insufficient documentation

## 2018-01-19 DIAGNOSIS — K210 Gastro-esophageal reflux disease with esophagitis: Secondary | ICD-10-CM | POA: Diagnosis present

## 2018-01-19 DIAGNOSIS — Z818 Family history of other mental and behavioral disorders: Secondary | ICD-10-CM | POA: Diagnosis not present

## 2018-01-19 DIAGNOSIS — Z888 Allergy status to other drugs, medicaments and biological substances status: Secondary | ICD-10-CM | POA: Diagnosis not present

## 2018-01-19 DIAGNOSIS — Z791 Long term (current) use of non-steroidal anti-inflammatories (NSAID): Secondary | ICD-10-CM | POA: Diagnosis not present

## 2018-01-19 DIAGNOSIS — M4802 Spinal stenosis, cervical region: Secondary | ICD-10-CM | POA: Diagnosis present

## 2018-01-19 DIAGNOSIS — G600 Hereditary motor and sensory neuropathy: Secondary | ICD-10-CM | POA: Diagnosis present

## 2018-01-19 DIAGNOSIS — M25561 Pain in right knee: Secondary | ICD-10-CM | POA: Diagnosis present

## 2018-01-19 DIAGNOSIS — M21371 Foot drop, right foot: Secondary | ICD-10-CM | POA: Diagnosis present

## 2018-01-19 DIAGNOSIS — Z87891 Personal history of nicotine dependence: Secondary | ICD-10-CM | POA: Diagnosis not present

## 2018-01-19 DIAGNOSIS — Z79899 Other long term (current) drug therapy: Secondary | ICD-10-CM | POA: Diagnosis not present

## 2018-01-19 LAB — BASIC METABOLIC PANEL
Anion gap: 8 (ref 5–15)
BUN: 16 mg/dL (ref 8–23)
CO2: 27 mmol/L (ref 22–32)
Calcium: 9.1 mg/dL (ref 8.9–10.3)
Chloride: 106 mmol/L (ref 98–111)
Creatinine, Ser: 1.07 mg/dL (ref 0.61–1.24)
GFR calc Af Amer: >60 mL/min (ref >60)
GFR calc non Af Amer: >60 mL/min (ref >60)
Glucose, Bld: 106 mg/dL — ABNORMAL HIGH (ref 70–99)
Potassium: 4.8 mmol/L (ref 3.5–5.1)
Sodium: 141 mmol/L (ref 135–145)

## 2018-01-19 LAB — CBC
HCT: 43 % (ref 39.0–52.0)
Hemoglobin: 14.3 g/dL (ref 13.0–17.0)
MCH: 29.1 pg (ref 26.0–34.0)
MCHC: 33.3 g/dL (ref 30.0–36.0)
MCV: 87.4 fL (ref 80.0–100.0)
Platelets: 116 10*3/uL — ABNORMAL LOW (ref 150–400)
RBC: 4.92 MIL/uL (ref 4.22–5.81)
RDW: 13.1 % (ref 11.5–15.5)
WBC: 6.3 10*3/uL (ref 4.0–10.5)
nRBC: 0 % (ref 0.0–0.2)

## 2018-01-19 LAB — URINALYSIS, ROUTINE W REFLEX MICROSCOPIC
Bilirubin Urine: NEGATIVE
Glucose, UA: NEGATIVE mg/dL
Hgb urine dipstick: NEGATIVE
Ketones, ur: NEGATIVE mg/dL
Leukocytes, UA: NEGATIVE
Nitrite: NEGATIVE
Protein, ur: NEGATIVE mg/dL
Specific Gravity, Urine: 1.017 (ref 1.005–1.030)
pH: 6 (ref 5.0–8.0)

## 2018-01-19 LAB — SURGICAL PCR SCREEN
MRSA, PCR: NEGATIVE
Staphylococcus aureus: POSITIVE — AB

## 2018-01-19 LAB — APTT: aPTT: 34 seconds (ref 24–36)

## 2018-01-19 LAB — PROTIME-INR
INR: 1.13
Prothrombin Time: 14.4 seconds (ref 11.4–15.2)

## 2018-01-20 LAB — ABO/RH: ABO/RH(D): A POS

## 2018-01-20 NOTE — Progress Notes (Signed)
Cbc routed via epic to dr Sydnee Cabal

## 2018-01-21 ENCOUNTER — Inpatient Hospital Stay (HOSPITAL_COMMUNITY)
Admission: RE | Admit: 2018-01-21 | Discharge: 2018-01-23 | DRG: 470 | Disposition: A | Discharge status: Home Health | Payer: PPO | Attending: Specialist | Admitting: Specialist

## 2018-01-21 ENCOUNTER — Inpatient Hospital Stay (HOSPITAL_COMMUNITY): Payer: PPO | Admitting: Anesthesiology

## 2018-01-21 ENCOUNTER — Encounter (HOSPITAL_COMMUNITY)
Admission: RE | Disposition: A | Discharge status: Home Health | Payer: Self-pay | Source: Home / Self Care | Attending: Specialist

## 2018-01-21 ENCOUNTER — Encounter (HOSPITAL_COMMUNITY): Payer: Self-pay | Admitting: *Deleted

## 2018-01-21 ENCOUNTER — Other Ambulatory Visit: Payer: Self-pay

## 2018-01-21 DIAGNOSIS — M4802 Spinal stenosis, cervical region: Secondary | ICD-10-CM | POA: Diagnosis present

## 2018-01-21 DIAGNOSIS — M25761 Osteophyte, right knee: Secondary | ICD-10-CM | POA: Diagnosis present

## 2018-01-21 DIAGNOSIS — M25561 Pain in right knee: Secondary | ICD-10-CM | POA: Diagnosis present

## 2018-01-21 DIAGNOSIS — N400 Enlarged prostate without lower urinary tract symptoms: Secondary | ICD-10-CM | POA: Diagnosis present

## 2018-01-21 DIAGNOSIS — Z791 Long term (current) use of non-steroidal anti-inflammatories (NSAID): Secondary | ICD-10-CM

## 2018-01-21 DIAGNOSIS — Z886 Allergy status to analgesic agent status: Secondary | ICD-10-CM

## 2018-01-21 DIAGNOSIS — M21371 Foot drop, right foot: Secondary | ICD-10-CM | POA: Diagnosis present

## 2018-01-21 DIAGNOSIS — Z96659 Presence of unspecified artificial knee joint: Secondary | ICD-10-CM

## 2018-01-21 DIAGNOSIS — Z79899 Other long term (current) drug therapy: Secondary | ICD-10-CM

## 2018-01-21 DIAGNOSIS — R972 Elevated prostate specific antigen [PSA]: Secondary | ICD-10-CM | POA: Diagnosis present

## 2018-01-21 DIAGNOSIS — M1711 Unilateral primary osteoarthritis, right knee: Secondary | ICD-10-CM | POA: Diagnosis present

## 2018-01-21 DIAGNOSIS — Z818 Family history of other mental and behavioral disorders: Secondary | ICD-10-CM

## 2018-01-21 DIAGNOSIS — G600 Hereditary motor and sensory neuropathy: Secondary | ICD-10-CM | POA: Diagnosis present

## 2018-01-21 DIAGNOSIS — K210 Gastro-esophageal reflux disease with esophagitis: Secondary | ICD-10-CM | POA: Diagnosis present

## 2018-01-21 DIAGNOSIS — Z888 Allergy status to other drugs, medicaments and biological substances status: Secondary | ICD-10-CM | POA: Diagnosis not present

## 2018-01-21 DIAGNOSIS — Z87891 Personal history of nicotine dependence: Secondary | ICD-10-CM

## 2018-01-21 HISTORY — PX: TOTAL KNEE ARTHROPLASTY: SHX125

## 2018-01-21 LAB — TYPE AND SCREEN
ABO/RH(D): A POS
Antibody Screen: NEGATIVE

## 2018-01-21 SURGERY — ARTHROPLASTY, KNEE, TOTAL
Anesthesia: General | Site: Knee | Laterality: Right

## 2018-01-21 MED ORDER — SODIUM CHLORIDE 0.9 % IR SOLN
Status: DC | PRN
  Administered 2018-01-21: 1000 mL

## 2018-01-21 MED ORDER — DOCUSATE SODIUM 100 MG PO CAPS
100.0000 mg | ORAL_CAPSULE | Freq: Two times a day (BID) | ORAL | Status: DC
  Administered 2018-01-21 – 2018-01-23 (×5): 100 mg via ORAL
  Filled 2018-01-21 (×5): qty 1

## 2018-01-21 MED ORDER — ONDANSETRON HCL 4 MG/2ML IJ SOLN
INTRAMUSCULAR | Status: DC | PRN
  Administered 2018-01-21: 4 mg via INTRAVENOUS

## 2018-01-21 MED ORDER — METHOCARBAMOL 500 MG PO TABS
500.0000 mg | ORAL_TABLET | Freq: Four times a day (QID) | ORAL | Status: DC | PRN
  Administered 2018-01-22 – 2018-01-23 (×2): 500 mg via ORAL
  Filled 2018-01-21 (×2): qty 1

## 2018-01-21 MED ORDER — TAMSULOSIN HCL 0.4 MG PO CAPS
0.4000 mg | ORAL_CAPSULE | Freq: Every day | ORAL | Status: DC
  Administered 2018-01-22 – 2018-01-23 (×2): 0.4 mg via ORAL
  Filled 2018-01-21 (×2): qty 1

## 2018-01-21 MED ORDER — MENTHOL 3 MG MT LOZG: 1.0000 | LOZENGE | OROMUCOSAL | Status: DC | PRN

## 2018-01-21 MED ORDER — METOCLOPRAMIDE HCL 5 MG PO TABS: 5.0000 mg | ORAL_TABLET | Freq: Three times a day (TID) | ORAL | Status: DC | PRN

## 2018-01-21 MED ORDER — ONDANSETRON HCL 4 MG/2ML IJ SOLN
INTRAMUSCULAR | Status: AC
  Filled 2018-01-21: qty 2

## 2018-01-21 MED ORDER — PHENOL 1.4 % MT LIQD
1.0000 | OROMUCOSAL | Status: DC | PRN
  Filled 2018-01-21: qty 177

## 2018-01-21 MED ORDER — BUPIVACAINE-EPINEPHRINE 0.25% -1:200000 IJ SOLN
INTRAMUSCULAR | Status: DC | PRN
  Administered 2018-01-21: 30 mL

## 2018-01-21 MED ORDER — KETOROLAC TROMETHAMINE 30 MG/ML IJ SOLN
INTRAMUSCULAR | Status: AC
  Filled 2018-01-21: qty 1

## 2018-01-21 MED ORDER — PROPOFOL 500 MG/50ML IV EMUL
INTRAVENOUS | Status: DC | PRN
  Administered 2018-01-21: 75 ug/kg/min via INTRAVENOUS

## 2018-01-21 MED ORDER — LACTATED RINGERS IV SOLN
INTRAVENOUS | Status: DC
  Administered 2018-01-21 (×2): via INTRAVENOUS

## 2018-01-21 MED ORDER — SODIUM CHLORIDE 0.9 % IV SOLN
INTRAVENOUS | Status: DC
  Administered 2018-01-21: 75 mL/h via INTRAVENOUS
  Administered 2018-01-22: 23:00:00 via INTRAVENOUS

## 2018-01-21 MED ORDER — DIPHENHYDRAMINE HCL 12.5 MG/5ML PO ELIX: 12.5000 mg | ORAL_SOLUTION | ORAL | Status: DC | PRN

## 2018-01-21 MED ORDER — METOCLOPRAMIDE HCL 5 MG/ML IJ SOLN: 5.0000 mg | Freq: Three times a day (TID) | INTRAMUSCULAR | Status: DC | PRN

## 2018-01-21 MED ORDER — ENOXAPARIN SODIUM 30 MG/0.3ML ~~LOC~~ SOLN
30.0000 mg | Freq: Two times a day (BID) | SUBCUTANEOUS | Status: DC
  Administered 2018-01-22 – 2018-01-23 (×3): 30 mg via SUBCUTANEOUS
  Filled 2018-01-21 (×3): qty 0.3

## 2018-01-21 MED ORDER — METHOCARBAMOL 500 MG PO TABS: 500.0000 mg | ORAL_TABLET | Freq: Three times a day (TID) | ORAL | 1 refills | Status: DC | PRN

## 2018-01-21 MED ORDER — CHLORHEXIDINE GLUCONATE 4 % EX LIQD: 60.0000 mL | Freq: Once | CUTANEOUS | Status: DC

## 2018-01-21 MED ORDER — HYDROMORPHONE HCL 1 MG/ML IJ SOLN
0.2500 mg | INTRAMUSCULAR | Status: DC | PRN
  Administered 2018-01-21 (×4): 0.5 mg via INTRAVENOUS

## 2018-01-21 MED ORDER — SODIUM CHLORIDE (PF) 0.9 % IJ SOLN
INTRAMUSCULAR | Status: AC
  Filled 2018-01-21: qty 50

## 2018-01-21 MED ORDER — CEFAZOLIN SODIUM-DEXTROSE 2-4 GM/100ML-% IV SOLN
2.0000 g | Freq: Four times a day (QID) | INTRAVENOUS | Status: AC
  Administered 2018-01-21 (×2): 2 g via INTRAVENOUS
  Filled 2018-01-21 (×2): qty 100

## 2018-01-21 MED ORDER — FERROUS SULFATE 325 (65 FE) MG PO TABS
325.0000 mg | ORAL_TABLET | Freq: Three times a day (TID) | ORAL | Status: DC
  Administered 2018-01-22 – 2018-01-23 (×3): 325 mg via ORAL
  Filled 2018-01-21 (×4): qty 1

## 2018-01-21 MED ORDER — HYDROMORPHONE HCL 2 MG/ML IJ SOLN
INTRAMUSCULAR | Status: AC
  Filled 2018-01-21: qty 1

## 2018-01-21 MED ORDER — METHOCARBAMOL 500 MG IVPB - SIMPLE MED
500.0000 mg | Freq: Four times a day (QID) | INTRAVENOUS | Status: DC | PRN
  Administered 2018-01-21: 500 mg via INTRAVENOUS
  Filled 2018-01-21: qty 50

## 2018-01-21 MED ORDER — TRANEXAMIC ACID-NACL 1000-0.7 MG/100ML-% IV SOLN
1000.0000 mg | INTRAVENOUS | Status: AC
  Administered 2018-01-21: 1000 mg via INTRAVENOUS
  Filled 2018-01-21: qty 100

## 2018-01-21 MED ORDER — ALUM & MAG HYDROXIDE-SIMETH 200-200-20 MG/5ML PO SUSP
30.0000 mL | ORAL | Status: DC | PRN
  Administered 2018-01-22: 30 mL via ORAL
  Filled 2018-01-21: qty 30

## 2018-01-21 MED ORDER — HYDROMORPHONE HCL 1 MG/ML IJ SOLN
INTRAMUSCULAR | Status: DC | PRN
  Administered 2018-01-21: 0.5 mg via INTRAVENOUS

## 2018-01-21 MED ORDER — POLYETHYLENE GLYCOL 3350 17 G PO PACK: 17.0000 g | PACK | Freq: Every day | ORAL | Status: DC | PRN

## 2018-01-21 MED ORDER — PROPOFOL 500 MG/50ML IV EMUL
INTRAVENOUS | Status: DC | PRN
  Administered 2018-01-21: 20 mg via INTRAVENOUS
  Administered 2018-01-21: 120 mg via INTRAVENOUS

## 2018-01-21 MED ORDER — RIVAROXABAN 10 MG PO TABS: 10.0000 mg | ORAL_TABLET | Freq: Every day | ORAL | 0 refills | Status: DC

## 2018-01-21 MED ORDER — SODIUM CHLORIDE (PF) 0.9 % IJ SOLN
INTRAMUSCULAR | Status: DC | PRN
  Administered 2018-01-21: 30 mL

## 2018-01-21 MED ORDER — OXYCODONE HCL 5 MG PO TABS: 5.0000 mg | ORAL_TABLET | ORAL | 0 refills | Status: AC | PRN

## 2018-01-21 MED ORDER — BUPIVACAINE-EPINEPHRINE (PF) 0.25% -1:200000 IJ SOLN
INTRAMUSCULAR | Status: AC
  Filled 2018-01-21: qty 30

## 2018-01-21 MED ORDER — HYDROMORPHONE HCL 1 MG/ML IJ SOLN
INTRAMUSCULAR | Status: AC
  Filled 2018-01-21: qty 2

## 2018-01-21 MED ORDER — PROPOFOL 10 MG/ML IV BOLUS
INTRAVENOUS | Status: AC
  Filled 2018-01-21: qty 40

## 2018-01-21 MED ORDER — HYDROMORPHONE HCL 1 MG/ML IJ SOLN: 0.5000 mg | INTRAMUSCULAR | Status: DC | PRN

## 2018-01-21 MED ORDER — MAGNESIUM CITRATE PO SOLN: 1.0000 | Freq: Once | ORAL | Status: DC | PRN

## 2018-01-21 MED ORDER — OXYCODONE HCL 5 MG PO TABS
5.0000 mg | ORAL_TABLET | ORAL | Status: DC | PRN
  Administered 2018-01-21 – 2018-01-23 (×8): 10 mg via ORAL
  Filled 2018-01-21 (×8): qty 2

## 2018-01-21 MED ORDER — KETOROLAC TROMETHAMINE 30 MG/ML IJ SOLN
INTRAMUSCULAR | Status: DC | PRN
  Administered 2018-01-21: 30 mg

## 2018-01-21 MED ORDER — OXYCODONE HCL 5 MG PO TABS
10.0000 mg | ORAL_TABLET | ORAL | Status: DC | PRN
  Administered 2018-01-21 – 2018-01-22 (×3): 10 mg via ORAL
  Filled 2018-01-21 (×3): qty 2

## 2018-01-21 MED ORDER — ONDANSETRON HCL 4 MG/2ML IJ SOLN: 4.0000 mg | Freq: Four times a day (QID) | INTRAMUSCULAR | Status: DC | PRN

## 2018-01-21 MED ORDER — ACETAMINOPHEN 500 MG PO TABS
1000.0000 mg | ORAL_TABLET | Freq: Four times a day (QID) | ORAL | Status: AC
  Administered 2018-01-21: 1000 mg via ORAL
  Filled 2018-01-21 (×2): qty 2

## 2018-01-21 MED ORDER — BUPIVACAINE-EPINEPHRINE (PF) 0.5% -1:200000 IJ SOLN
INTRAMUSCULAR | Status: DC | PRN
  Administered 2018-01-21: 20 mL via PERINEURAL

## 2018-01-21 MED ORDER — MIDAZOLAM HCL 2 MG/2ML IJ SOLN
1.0000 mg | INTRAMUSCULAR | Status: DC
  Administered 2018-01-21: 2 mg via INTRAVENOUS
  Filled 2018-01-21: qty 2

## 2018-01-21 MED ORDER — STERILE WATER FOR IRRIGATION IR SOLN
Status: DC | PRN
  Administered 2018-01-21: 2000 mL

## 2018-01-21 MED ORDER — CEFAZOLIN SODIUM-DEXTROSE 2-4 GM/100ML-% IV SOLN
2.0000 g | INTRAVENOUS | Status: AC
  Administered 2018-01-21: 2 g via INTRAVENOUS
  Filled 2018-01-21: qty 100

## 2018-01-21 MED ORDER — ONDANSETRON HCL 4 MG PO TABS: 4.0000 mg | ORAL_TABLET | Freq: Four times a day (QID) | ORAL | Status: DC | PRN

## 2018-01-21 MED ORDER — METHOCARBAMOL 500 MG IVPB - SIMPLE MED
INTRAVENOUS | Status: AC
  Filled 2018-01-21: qty 50

## 2018-01-21 MED ORDER — PANTOPRAZOLE SODIUM 40 MG PO TBEC
40.0000 mg | DELAYED_RELEASE_TABLET | Freq: Every day | ORAL | Status: DC
  Administered 2018-01-21 – 2018-01-23 (×3): 40 mg via ORAL
  Filled 2018-01-21 (×3): qty 1

## 2018-01-21 MED ORDER — ACETAMINOPHEN 325 MG PO TABS
325.0000 mg | ORAL_TABLET | Freq: Four times a day (QID) | ORAL | Status: DC | PRN
  Filled 2018-01-21 (×2): qty 2

## 2018-01-21 MED ORDER — BISACODYL 5 MG PO TBEC: 5.0000 mg | DELAYED_RELEASE_TABLET | Freq: Every day | ORAL | Status: DC | PRN

## 2018-01-21 MED ORDER — FENTANYL CITRATE (PF) 100 MCG/2ML IJ SOLN
50.0000 ug | INTRAMUSCULAR | Status: DC
  Administered 2018-01-21: 100 ug via INTRAVENOUS
  Filled 2018-01-21: qty 2

## 2018-01-21 MED ORDER — PROMETHAZINE HCL 25 MG/ML IJ SOLN: 6.2500 mg | INTRAMUSCULAR | Status: DC | PRN

## 2018-01-21 SURGICAL SUPPLY — 71 items
ATTUNE MED DOME PAT 41 KNEE (Knees) ×2 IMPLANT
ATTUNE MED DOME PAT 41MM KNEE (Knees) ×1 IMPLANT
ATTUNE PS FEM RT SZ 7 CEM KNEE (Femur) ×3 IMPLANT
ATTUNE PSRP INSR SZ7 5 KNEE (Insert) ×2 IMPLANT
ATTUNE PSRP INSR SZ7 5MM KNEE (Insert) ×1 IMPLANT
BAG DECANTER FOR FLEXI CONT (MISCELLANEOUS) IMPLANT
BAG ZIPLOCK 12X15 (MISCELLANEOUS) ×6 IMPLANT
BANDAGE ACE 4X5 VEL STRL LF (GAUZE/BANDAGES/DRESSINGS) ×3 IMPLANT
BANDAGE ACE 6X5 VEL STRL LF (GAUZE/BANDAGES/DRESSINGS) ×3 IMPLANT
BASE TIBIAL ROT PLAT SZ 7 KNEE (Knees) ×1 IMPLANT
BLADE SAG 18X100X1.27 (BLADE) ×3 IMPLANT
BLADE SAW SGTL 13.0X1.19X90.0M (BLADE) ×3 IMPLANT
BLADE SURG SZ10 CARB STEEL (BLADE) ×6 IMPLANT
BOWL SMART MIX CTS (DISPOSABLE) ×3 IMPLANT
CEMENT HV SMART SET (Cement) ×6 IMPLANT
COVER SURGICAL LIGHT HANDLE (MISCELLANEOUS) ×3 IMPLANT
COVER WAND RF STERILE (DRAPES) ×3 IMPLANT
CUFF TOURN SGL QUICK 34 (TOURNIQUET CUFF) ×2
CUFF TRNQT CYL 34X4X40X1 (TOURNIQUET CUFF) ×1 IMPLANT
DECANTER SPIKE VIAL GLASS SM (MISCELLANEOUS) ×3 IMPLANT
DERMABOND ADVANCED (GAUZE/BANDAGES/DRESSINGS) ×2
DERMABOND ADVANCED .7 DNX12 (GAUZE/BANDAGES/DRESSINGS) ×1 IMPLANT
DRAPE U-SHAPE 47X51 STRL (DRAPES) ×3 IMPLANT
DRSG AQUACEL AG ADV 3.5X10 (GAUZE/BANDAGES/DRESSINGS) ×3 IMPLANT
DRSG TEGADERM 4X4.75 (GAUZE/BANDAGES/DRESSINGS) ×3 IMPLANT
DURAPREP 26ML APPLICATOR (WOUND CARE) ×6 IMPLANT
ELECT REM PT RETURN 15FT ADLT (MISCELLANEOUS) ×3 IMPLANT
EVACUATOR 1/8 PVC DRAIN (DRAIN) ×3 IMPLANT
GAUZE SPONGE 2X2 8PLY STRL LF (GAUZE/BANDAGES/DRESSINGS) ×1 IMPLANT
GLOVE BIO SURGEON STRL SZ7.5 (GLOVE) ×6 IMPLANT
GLOVE BIOGEL PI IND STRL 7.0 (GLOVE) ×2 IMPLANT
GLOVE BIOGEL PI IND STRL 7.5 (GLOVE) ×2 IMPLANT
GLOVE BIOGEL PI IND STRL 8 (GLOVE) ×3 IMPLANT
GLOVE BIOGEL PI INDICATOR 7.0 (GLOVE) ×4
GLOVE BIOGEL PI INDICATOR 7.5 (GLOVE) ×4
GLOVE BIOGEL PI INDICATOR 8 (GLOVE) ×6
GLOVE ECLIPSE 8.0 STRL XLNG CF (GLOVE) ×6 IMPLANT
GLOVE SURG ORTHO 9.0 STRL STRW (GLOVE) ×3 IMPLANT
GLOVE SURG SS PI 7.5 STRL IVOR (GLOVE) ×6 IMPLANT
GOWN STRL REIN 2XL XLG LVL4 (GOWN DISPOSABLE) ×3 IMPLANT
GOWN STRL REUS W/TWL LRG LVL3 (GOWN DISPOSABLE) ×3 IMPLANT
GOWN STRL REUS W/TWL XL LVL3 (GOWN DISPOSABLE) ×6 IMPLANT
HANDPIECE INTERPULSE COAX TIP (DISPOSABLE) ×2
HOLDER FOLEY CATH W/STRAP (MISCELLANEOUS) ×3 IMPLANT
IMMOBILIZER KNEE 20 (SOFTGOODS) ×3
IMMOBILIZER KNEE 20 THIGH 36 (SOFTGOODS) ×1 IMPLANT
NS IRRIG 1000ML POUR BTL (IV SOLUTION) ×3 IMPLANT
PACK TOTAL KNEE CUSTOM (KITS) ×3 IMPLANT
PIN STEINMAN FIXATION KNEE (PIN) ×3 IMPLANT
PROTECTOR NERVE ULNAR (MISCELLANEOUS) ×3 IMPLANT
SET HNDPC FAN SPRY TIP SCT (DISPOSABLE) ×1 IMPLANT
SET PAD KNEE POSITIONER (MISCELLANEOUS) ×6 IMPLANT
SPONGE GAUZE 2X2 STER 10/PKG (GAUZE/BANDAGES/DRESSINGS) ×2
SPONGE LAP 18X18 RF (DISPOSABLE) IMPLANT
SPONGE SURGIFOAM ABS GEL 100 (HEMOSTASIS) ×3 IMPLANT
STOCKINETTE 6  STRL (DRAPES) ×2
STOCKINETTE 6 STRL (DRAPES) ×1 IMPLANT
SUT BONE WAX W31G (SUTURE) IMPLANT
SUT MNCRL AB 3-0 PS2 18 (SUTURE) ×3 IMPLANT
SUT VIC AB 1 CT1 27 (SUTURE) ×8
SUT VIC AB 1 CT1 27XBRD ANTBC (SUTURE) ×4 IMPLANT
SUT VIC AB 2-0 CT1 27 (SUTURE) ×4
SUT VIC AB 2-0 CT1 TAPERPNT 27 (SUTURE) ×2 IMPLANT
SUT VLOC 180 0 24IN GS25 (SUTURE) ×3 IMPLANT
SYR 50ML LL SCALE MARK (SYRINGE) IMPLANT
TAPE STRIPS DRAPE STRL (GAUZE/BANDAGES/DRESSINGS) ×3 IMPLANT
TIBIAL BASE ROT PLAT SZ 7 KNEE (Knees) ×3 IMPLANT
TRAY FOLEY MTR SLVR 16FR STAT (SET/KITS/TRAYS/PACK) ×3 IMPLANT
WATER STERILE IRR 1000ML POUR (IV SOLUTION) ×6 IMPLANT
WRAP KNEE MAXI GEL POST OP (GAUZE/BANDAGES/DRESSINGS) ×3 IMPLANT
YANKAUER SUCT BULB TIP 10FT TU (MISCELLANEOUS) ×3 IMPLANT

## 2018-01-21 NOTE — Interval H&P Note (Signed)
History and Physical Interval Note:  01/21/2018 7:36 AM  Philip Stone  has presented today for surgery, with the diagnosis of Right knee osteoarthritis  The various methods of treatment have been discussed with the patient and family. After consideration of risks, benefits and other options for treatment, the patient has consented to  Procedure(s): TOTAL KNEE ARTHROPLASTY (Right) as a surgical intervention .  The patient's history has been reviewed, patient examined, no change in status, stable for surgery.  I have reviewed the patient's chart and labs.  Questions were answered to the patient's satisfaction.     Kortney Schoenfelder ANDREW

## 2018-01-21 NOTE — Evaluation (Signed)
Physical Therapy Evaluation Patient Details Name: Philip Stone MRN: [id number] DOB: February 03, 1951 Today's Date: 01/21/2018   History of Present Illness  Patient is a 67 y/o male admitted for R TKA.  PMH positive for Charcot-Marie-Tooth with R foot drop and h/o neuropathy, arthritis, cervical spinal surgery and foot arthroscopy.    Clinical Impression  Patient presents with decreased mobility due to pain, limited AROM and strength R LE and decreased weight tolerance with history of foot/ankle issues with CMT/neuropathy/footdrop.  Feel he will progress with skilled PT in the acute setting and follow up PT as noted below.      Follow Up Recommendations Follow surgeon's recommendation for DC plan and follow-up therapies    Equipment Recommendations  None recommended by PT    Recommendations for Other Services       Precautions / Restrictions Precautions Precautions: Fall;Knee Required Braces or Orthoses: Knee Immobilizer - Right Restrictions Other Position/Activity Restrictions: WBAT      Mobility  Bed Mobility Overal bed mobility: Needs Assistance Bed Mobility: Supine to Sit     Supine to sit: Min assist;HOB elevated     General bed mobility comments: for R LE  Transfers Overall transfer level: Needs assistance Equipment used: Rolling walker (2 wheeled) Transfers: Sit to/from [facility] Sit to Stand: Min assist Stand pivot transfers: Min assist       General transfer comment: assist from EOB stand pivot to chair with RW, limited ambulation due to not having foot orthoses here; assist for balance/safety  Ambulation/Gait                Stairs            Wheelchair Mobility    Modified Rankin (Stroke Patients Only)       Balance Overall balance assessment: Needs assistance   Sitting balance-Leahy Scale: Good     Standing balance support: Bilateral upper extremity supported Standing balance-Leahy Scale: Poor Standing balance  comment: UE support needed for balance due to pain and stiffness                             Pertinent Vitals/Pain Pain Assessment: 0-10 Pain Score: 6  Pain Location: R knee Pain Descriptors / Indicators: Aching;Operative site guarding Pain Intervention(s): Monitored during session;Repositioned    Home Living Family/patient expects to be discharged to:: Private residence Living Arrangements: Alone Available Help at Discharge: Friend(s)(girlfriend will stay) Type of Home: House Home Access: Stairs to enter Entrance Stairs-Rails: Left;Can reach Advertising account executive of Steps: 2 Home Layout: One level Home Equipment: Walker - 2 wheels;Grab bars - tub/shower;Grab bars - toilet;Crutches;Cane - single point      Prior Function Level of Independence: Independent               Hand Dominance        Extremity/Trunk Assessment        Lower Extremity Assessment Lower Extremity Assessment: RLE deficits/detail;LLE deficits/detail RLE Deficits / Details: ankle AROM limited due to h/o CMT/foot drop, stiffness throughout toes, reports wears heel lift in shoe and foot drop brace at baseline RLE Sensation: history of peripheral neuropathy LLE Deficits / Details: ankle AROM limited due to h/o CMT stiff toes and reports wears a larger brace on L foot LLE Sensation: history of peripheral neuropathy       Communication   Communication: No difficulties  Cognition Arousal/Alertness: Awake/alert Behavior During Therapy: WFL for tasks assessed/performed Overall Cognitive Status: Within Functional  Limits for tasks assessed                                        General Comments      Exercises Total Joint Exercises Ankle Circles/Pumps: 5 reps;AROM;Both;Supine Quad Sets: AROM;5 reps;Both;Supine Heel Slides: AAROM;5 reps;Right;Supine Hip ABduction/ADduction: AROM;5 reps;Supine;Right   Assessment/Plan    PT Assessment Patient needs  continued PT services  PT Problem List Decreased activity tolerance;Decreased balance;Decreased strength;Decreased mobility;Decreased range of motion       PT Treatment Interventions DME instruction;Functional mobility training;Gait training;Therapeutic activities;Therapeutic exercise;Stair training;Balance training;Patient/family education    PT Goals (Current goals can be found in the Care Plan section)  Acute Rehab PT Goals Patient Stated Goal: to go home PT Goal Formulation: With patient Time For Goal Achievement: 01/24/18 Potential to Achieve Goals: Good    Frequency 7X/week   Barriers to discharge        Co-evaluation               AM-PAC PT "6 Clicks" Mobility  Outcome Measure Help needed turning from your back to your side while in a flat bed without using bedrails?: A Little Help needed moving from lying on your back to sitting on the side of a flat bed without using bedrails?: A Little Help needed moving to and from a bed to a chair (including a wheelchair)?: A Little Help needed standing up from a chair using your arms (e.g., wheelchair or bedside chair)?: A Little Help needed to walk in hospital room?: A Little Help needed climbing 3-5 steps with a railing? : A Little 6 Click Score: 18    End of Session Equipment Utilized During Treatment: Gait belt;Right knee immobilizer Activity Tolerance: Patient tolerated treatment well Patient left: with call bell/phone within reach;in chair Nurse Communication: Mobility status PT Visit Diagnosis: Other abnormalities of gait and mobility (R26.89)    Time: 6979-4801 PT Time Calculation (min) (ACUTE ONLY): 24 min   Charges:   PT Evaluation $PT Eval Low Complexity: 1 Low PT Treatments $Therapeutic Activity: 8-22 mins        Magda Kiel, PT Acute Rehabilitation Services [phone] 01/21/2018   [name] 01/21/2018, 6:59 PM

## 2018-01-21 NOTE — Anesthesia Procedure Notes (Signed)
Anesthesia Regional Block: Adductor canal block   Pre-Anesthetic Checklist: ,, timeout performed, Correct Patient, Correct Site, Correct Laterality, Correct Procedure, Correct Position, site marked, Risks and benefits discussed,  Surgical consent,  Pre-op evaluation,  At surgeon's request and post-op pain management  Laterality: Right  Prep: chloraprep       Needles:  Injection technique: Single-shot  Needle Type: Stimulator Needle - 80     Needle Length: 10cm  Needle Gauge: 21     Additional Needles:   Narrative:  Start time: 01/21/2018 9:06 AM End time: 01/21/2018 9:26 AM Injection made incrementally with aspirations every 5 mL.  Performed by: Personally

## 2018-01-21 NOTE — Anesthesia Procedure Notes (Signed)
Spinal  Patient location during procedure: OR Start time: 01/21/2018 10:30 AM End time: 01/21/2018 10:33 AM Staffing Anesthesiologist: Duane Boston, MD Resident/CRNA: Glory Buff, CRNA Performed: resident/CRNA  Preanesthetic Checklist Completed: patient identified, site marked, surgical consent, pre-op evaluation, timeout performed, IV checked, risks and benefits discussed and monitors and equipment checked Spinal Block Patient position: sitting Prep: DuraPrep Patient monitoring: heart rate, continuous pulse ox and blood pressure Approach: midline Location: L3-4 Injection technique: single-shot Needle Needle type: Pencan  Needle gauge: 24 G Needle length: 9 cm Needle insertion depth: 5 cm Assessment Sensory level: T6 Additional Notes Kit expiration date checked and verified.  Sterile prep and drape, skin local with 1% lidocaine, - heme, - paraesthesia, + CSF pre and post injection, patient tolerated well.

## 2018-01-21 NOTE — Anesthesia Preprocedure Evaluation (Addendum)
Anesthesia Evaluation  Patient identified by MRN, date of birth, ID band Patient awake    Reviewed: Allergy & Precautions, NPO status , Patient's Chart, lab work & pertinent test results  Airway Mallampati: II  TM Distance: >3 FB Neck ROM: Full    Dental no notable dental hx. (+) Dental Advisory Given   Pulmonary former smoker,    Pulmonary exam normal        Cardiovascular negative cardio ROS Normal cardiovascular exam     Neuro/Psych negative neurological ROS     GI/Hepatic negative GI ROS, Neg liver ROS,   Endo/Other  negative endocrine ROS  Renal/GU negative Renal ROS     Musculoskeletal  (+) Arthritis ,   Abdominal   Peds  Hematology negative hematology ROS (+)   Anesthesia Other Findings Day of surgery medications reviewed with the patient.  Reproductive/Obstetrics                            Anesthesia Physical Anesthesia Plan  ASA: II  Anesthesia Plan: Spinal   Post-op Pain Management:  Regional for Post-op pain   Induction:   PONV Risk Score and Plan: 2 and Ondansetron and Propofol infusion  Airway Management Planned: Natural Airway and Simple Face Mask  Additional Equipment:   Intra-op Plan:   Post-operative Plan:   Informed Consent: I have reviewed the patients History and Physical, chart, labs and discussed the procedure including the risks, benefits and alternatives for the proposed anesthesia with the patient or authorized representative who has indicated his/her understanding and acceptance.   Dental advisory given  Plan Discussed with: CRNA and Anesthesiologist  Anesthesia Plan Comments:        Anesthesia Quick Evaluation

## 2018-01-21 NOTE — Anesthesia Procedure Notes (Signed)
Procedure Name: LMA Insertion Date/Time: 01/21/2018 11:05 AM Performed by: Glory Buff, CRNA Pre-anesthesia Checklist: Patient identified, Emergency Drugs available, Suction available and Patient being monitored Patient Re-evaluated:Patient Re-evaluated prior to induction Oxygen Delivery Method: Circle system utilized Preoxygenation: Pre-oxygenation with 100% oxygen Induction Type: IV induction Ventilation: Mask ventilation without difficulty LMA: LMA inserted LMA Size: 4.0 Number of attempts: 1 Placement Confirmation: positive ETCO2 Tube secured with: Tape Dental Injury: Teeth and Oropharynx as per pre-operative assessment

## 2018-01-21 NOTE — Progress Notes (Signed)
Assisted Dr. Singer with right, ultrasound guided, adductor canal block. Side rails up, monitors on throughout procedure. See vital signs in flow sheet. Tolerated Procedure well.  

## 2018-01-21 NOTE — Transfer of Care (Signed)
Immediate Anesthesia Transfer of Care Note  Patient: Philip Stone  Procedure(s) Performed: TOTAL KNEE ARTHROPLASTY (Right Knee)  Patient Location: PACU  Anesthesia Type:General  Level of Consciousness: awake, alert  and oriented  Airway & Oxygen Therapy: Patient Spontanous Breathing and Patient connected to face mask oxygen  Post-op Assessment: Report given to RN and Post -op Vital signs reviewed and stable  Post vital signs: Reviewed and stable  Last Vitals:  Vitals Value Taken Time  BP    Temp    Pulse    Resp    SpO2      Last Pain:  Vitals:   01/21/18 0802  TempSrc:   PainSc: 2       Patients Stated Pain Goal: 4 (81/18/86 7737)  Complications: No apparent anesthesia complications

## 2018-01-21 NOTE — Op Note (Signed)
DATE OF SURGERY:  01/21/2018  TIME: 12:34 PM  PATIENT NAME:  Philip Stone    AGE: 67 y.o.   PRE-OPERATIVE DIAGNOSIS:  Right knee osteoarthritis  POST-OPERATIVE DIAGNOSIS:  Right knee osteoarthritis  PROCEDURE:  Procedure(s): TOTAL KNEE ARTHROPLASTY  SURGEON:  Elizbeth Posa ANDREW  ASSISTANT:  Bryson Stilwell, PA-C, present and scrubbed throughout the case, critical for assistance with exposure, retraction, instrumentation, and closure.  OPERATIVE IMPLANTS: Depuy Attune RP PS .  Femur size 7, Tibia size 6, Patella size 41 3-peg oval button, with a 5 mm polyethylene insert.   PREOPERATIVE INDICATIONS:   Philip Stone is a 67 y.o. year old male with end stage bone on bone arthritis of the knee who failed conservative treatment and elected for Total Knee Arthroplasty.   The risks, benefits, and alternatives were discussed at length including but not limited to the risks of infection, bleeding, nerve injury, stiffness, blood clots, the need for revision surgery, cardiopulmonary complications, among others, and they were willing to proceed.  OPERATIVE DESCRIPTION:  The patient was brought to the operative room and placed in a supine position.  Spinal anesthesia was administered.  IV antibiotics were given.  The lower extremity was prepped and draped in the usual sterile fashion.  Time out was performed.  The leg was elevated and exsanguinated and the tourniquet was inflated.  Anterior quadriceps tendon splitting approach was performed.  The patella was retracted and osteophytes were removed.  The anterior horn of the medial and lateral meniscus was removed and cruciate ligaments resected.   The distal femur was opened with the drill and the intramedullary distal femoral cutting jig was utilized, set at 5 degrees resecting 10 mm off the distal femur.  Care was taken to protect the collateral ligaments.  The distal femoral sizing jig was applied, taking care to avoid notching.  Then the  4-in-1 cutting jig was applied and the anterior and posterior femur was cut, along with the chamfer cuts.    Then the extramedullary tibial cutting jig was utilized making the appropriate cut using the anterior tibial crest as a reference building in appropriate posterior slope.  Care was taken during the cut to protect the medial and collateral ligaments.  The proximal tibia was removed along with the posterior horns of the menisci.   The posterior medial femoral osteophytes and posterior lateral femoral osteophytes were removed.    The flexion gap was then measured and was symmetric with the extension gap, measured at 5.  I completed the distal femoral preparation using the appropriate jig to prepare the box.  The patella was then measured, and cut with the saw.    The proximal tibia sized and prepared accordingly with the reamer and the punch, and then all components were trialed with the trial insert.  The knee was found to have excellent balance and full motion.    The above named components were then cemented into place and all excess cement was removed.  The trial polyethylene component was in place during cementation, and then was exchanged for the real polyethylene component.    The knee was easily taken through a range of motion and the patella tracked well and the knee irrigated copiously and the parapatellar and subcutaneous tissue closed with vicryl, and monocryl with steri strips for the skin.  The arthrotomy was closed at 90 of flexion. The wounds were dressed with sterile gauze and the tourniquet released and the patient was awakened and returned to the PACU in stable and  satisfactory condition.  There were no complications.  Total tourniquet time was 85 minutes.

## 2018-01-21 NOTE — Anesthesia Postprocedure Evaluation (Signed)
Anesthesia Post Note  Patient: Philip Stone  Procedure(s) Performed: TOTAL KNEE ARTHROPLASTY (Right Knee)     Patient location during evaluation: PACU Anesthesia Type: Spinal and General Level of consciousness: sedated Pain management: pain level controlled Vital Signs Assessment: post-procedure vital signs reviewed and stable Respiratory status: spontaneous breathing and respiratory function stable Cardiovascular status: stable Postop Assessment: no apparent nausea or vomiting Anesthetic complications: no    Last Vitals:  Vitals:   01/21/18 1345 01/21/18 1400  BP: 135/66 (!) 142/83  Pulse: 73 76  Resp: 13 16  Temp: 36.8 C   SpO2: 100% 100%    Last Pain:  Vitals:   01/21/18 1345  TempSrc:   PainSc: 2     LLE Motor Response: Purposeful movement (01/21/18 1400)   RLE Motor Response: Purposeful movement (01/21/18 1400)        Reynaldo Rossman DANIEL

## 2018-01-21 NOTE — Interval H&P Note (Signed)
History and Physical Interval Note:  01/21/2018 9:38 AM  Philip Stone  has presented today for surgery, with the diagnosis of Right knee osteoarthritis  The various methods of treatment have been discussed with the patient and family. After consideration of risks, benefits and other options for treatment, the patient has consented to  Procedure(s): TOTAL KNEE ARTHROPLASTY (Right) as a surgical intervention .  The patient's history has been reviewed, patient examined, no change in status, stable for surgery.  I have reviewed the patient's chart and labs.  Questions were answered to the patient's satisfaction.     [name]

## 2018-01-22 LAB — BASIC METABOLIC PANEL
Anion gap: 8 (ref 5–15)
BUN: 19 mg/dL (ref 8–23)
CO2: 24 mmol/L (ref 22–32)
Calcium: 8.3 mg/dL — ABNORMAL LOW (ref 8.9–10.3)
Chloride: 106 mmol/L (ref 98–111)
Creatinine, Ser: 1.01 mg/dL (ref 0.61–1.24)
GFR calc Af Amer: >60 mL/min (ref >60)
GFR calc non Af Amer: >60 mL/min (ref >60)
Glucose, Bld: 187 mg/dL — ABNORMAL HIGH (ref 70–99)
Potassium: 4.2 mmol/L (ref 3.5–5.1)
Sodium: 138 mmol/L (ref 135–145)

## 2018-01-22 LAB — CBC
HCT: 35.2 % — ABNORMAL LOW (ref 39.0–52.0)
Hemoglobin: 11.5 g/dL — ABNORMAL LOW (ref 13.0–17.0)
MCH: 28.8 pg (ref 26.0–34.0)
MCHC: 32.7 g/dL (ref 30.0–36.0)
MCV: 88 fL (ref 80.0–100.0)
Platelets: 108 10*3/uL — ABNORMAL LOW (ref 150–400)
RBC: 4 MIL/uL — ABNORMAL LOW (ref 4.22–5.81)
RDW: 13.1 % (ref 11.5–15.5)
WBC: 15.9 10*3/uL — ABNORMAL HIGH (ref 4.0–10.5)
nRBC: 0 % (ref 0.0–0.2)

## 2018-01-22 MED ORDER — IBUPROFEN 400 MG PO TABS
800.0000 mg | ORAL_TABLET | Freq: Four times a day (QID) | ORAL | Status: DC | PRN
  Administered 2018-01-22 – 2018-01-23 (×2): 800 mg via ORAL
  Filled 2018-01-22 (×2): qty 2

## 2018-01-22 NOTE — Progress Notes (Signed)
Physical Therapy Treatment Patient Details Name: Philip Stone MRN: [id number] DOB: Jun 16, 1950 Today's Date: 01/22/2018    History of Present Illness Patient is a 67 y/o male admitted for R TKA.  PMH positive for Charcot-Marie-Tooth with R foot drop and h/o neuropathy, arthritis, cervical spinal surgery and foot arthroscopy.      PT Comments    Patient progressing this session able to negotiate steps appropriate for home entry.  Still dragging R foot some.  Plans to have his shoes and braces here tomorrow for practice prior to d/c home.     Follow Up Recommendations  Follow surgeon's recommendation for DC plan and follow-up therapies     Equipment Recommendations  None recommended by PT    Recommendations for Other Services       Precautions / Restrictions Precautions Precautions: Fall;Knee Restrictions Other Position/Activity Restrictions: WBAT    Mobility  Bed Mobility   Bed Mobility: Sit to Supine       Sit to supine: Supervision      Transfers Overall transfer level: Needs assistance Equipment used: Rolling walker (2 wheeled) Transfers: Sit to/from Stand Sit to Stand: Supervision         General transfer comment: up from recliner with armrests  Ambulation/Gait Ambulation/Gait assistance: Supervision Gait Distance (Feet): 180 Feet Assistive device: Rolling walker (2 wheeled) Gait Pattern/deviations: Step-through pattern;Decreased stride length;Step-to pattern;Antalgic     General Gait Details: heavy walker use when weight bearing on R, using slippers and heel lift on R   Stairs Stairs: Yes Stairs assistance: Min guard Stair Management: Two rails;Step to pattern;Forwards Number of Stairs: 2 General stair comments: cues for sequence, assist for safety   Wheelchair Mobility    Modified Rankin (Stroke Patients Only)       Balance Overall balance assessment: Mild deficits observed, not formally tested                                           Cognition Arousal/Alertness: Awake/alert Behavior During Therapy: WFL for tasks assessed/performed Overall Cognitive Status: Within Functional Limits for tasks assessed                                        Exercises Total Joint Exercises Long Arc Quad: Strengthening;10 reps;Seated;Right Knee Flexion: AROM;10 reps;Right;Seated Goniometric ROM: approx 15-65    General Comments        Pertinent Vitals/Pain Pain Assessment: Faces Faces Pain Scale: Hurts little more Pain Location: R knee Pain Descriptors / Indicators: Aching;Operative site guarding Pain Intervention(s): Monitored during session;Repositioned    Home Living                      Prior Function            PT Goals (current goals can now be found in the care plan section) Progress towards PT goals: Progressing toward goals    Frequency    7X/week      PT Plan Current plan remains appropriate    Co-evaluation              AM-PAC PT "6 Clicks" Mobility   Outcome Measure  Help needed turning from your back to your side while in a flat bed without using bedrails?: A Little Help needed moving from lying on your  back to sitting on the side of a flat bed without using bedrails?: A Little Help needed moving to and from a bed to a chair (including a wheelchair)?: A Little Help needed standing up from a chair using your arms (e.g., wheelchair or bedside chair)?: A Little Help needed to walk in hospital room?: A Little Help needed climbing 3-5 steps with a railing? : A Little 6 Click Score: 18    End of Session Equipment Utilized During Treatment: Gait belt Activity Tolerance: Patient tolerated treatment well Patient left: with call bell/phone within reach;in bed   PT Visit Diagnosis: Other abnormalities of gait and mobility (R26.89)     Time: 3291-9166 PT Time Calculation (min) (ACUTE ONLY): 25 min  Charges:  $Gait Training: 8-22 mins $Therapeutic  Exercise: 8-22 mins                     Magda Kiel, PT Acute Rehabilitation Services [phone] 01/22/2018    [name] 01/22/2018, 4:17 PM

## 2018-01-22 NOTE — Progress Notes (Signed)
Physical Therapy Treatment Patient Details Name: Philip Stone MRN: [id number] DOB: August 08, 1950 Today's Date: 01/22/2018    History of Present Illness Patient is a 67 y/o male admitted for R TKA.  PMH positive for Charcot-Marie-Tooth with R foot drop and h/o neuropathy, arthritis, cervical spinal surgery and foot arthroscopy.      PT Comments    Patient progressing with mobility and able to ambulate in hallway with his slippers from home and heel lift on R.  Noted AROM R knee approx 15-60 in supine.  Will continue skilled PT to progress mobility/independence prior to d/c home.    Follow Up Recommendations  Follow surgeon's recommendation for DC plan and follow-up therapies     Equipment Recommendations       Recommendations for Other Services       Precautions / Restrictions Precautions Precautions: Fall;Knee Required Braces or Orthoses: Knee Immobilizer - Right Restrictions Other Position/Activity Restrictions: WBAT    Mobility  Bed Mobility Overal bed mobility: Needs Assistance Bed Mobility: Supine to Sit     Supine to sit: Supervision;HOB elevated     General bed mobility comments: assist for safety  Transfers Overall transfer level: Needs assistance Equipment used: Rolling walker (2 wheeled) Transfers: Sit to/from Stand Sit to Stand: Min guard;From elevated surface         General transfer comment: cues for technique, assist for balance  Ambulation/Gait Ambulation/Gait assistance: Min guard;Supervision Gait Distance (Feet): 180 Feet Assistive device: Rolling walker (2 wheeled) Gait Pattern/deviations: Step-through pattern;Step-to pattern;Decreased stride length;Trunk flexed     General Gait Details: cues for forward gaze, posture, walker positioning and sequence.  assist for balance   Stairs             Wheelchair Mobility    Modified Rankin (Stroke Patients Only)       Balance Overall balance assessment: Mild deficits observed, not  formally tested                                          Cognition Arousal/Alertness: Awake/alert Behavior During Therapy: WFL for tasks assessed/performed Overall Cognitive Status: Within Functional Limits for tasks assessed                                        Exercises Total Joint Exercises Ankle Circles/Pumps: AROM;10 reps;Both;Supine Quad Sets: AROM;Both;Supine;10 reps Short Arc Quad: AROM;10 reps;Supine;Right Heel Slides: AAROM;Right;Supine;10 reps Hip ABduction/ADduction: AROM;10 reps;Supine;Right Straight Leg Raises: AROM;10 reps;Supine;Right    General Comments General comments (skin integrity, edema, etc.): girlfriend in room and supportive      Pertinent Vitals/Pain Pain Score: 4  Pain Location: R knee Pain Descriptors / Indicators: Aching;Operative site guarding Pain Intervention(s): Monitored during session;Repositioned;Ice applied    Home Living                      Prior Function            PT Goals (current goals can now be found in the care plan section) Progress towards PT goals: Progressing toward goals    Frequency    7X/week      PT Plan Current plan remains appropriate    Co-evaluation              AM-PAC PT "6 Clicks" Mobility   Outcome  Measure  Help needed turning from your back to your side while in a flat bed without using bedrails?: A Little Help needed moving from lying on your back to sitting on the side of a flat bed without using bedrails?: A Little Help needed moving to and from a bed to a chair (including a wheelchair)?: A Little Help needed standing up from a chair using your arms (e.g., wheelchair or bedside chair)?: A Little Help needed to walk in hospital room?: A Little Help needed climbing 3-5 steps with a railing? : A Little 6 Click Score: 18    End of Session Equipment Utilized During Treatment: Gait belt Activity Tolerance: Patient tolerated treatment  well Patient left: with call bell/phone within reach;in chair   PT Visit Diagnosis: Other abnormalities of gait and mobility (R26.89)     Time: 5997-7414 PT Time Calculation (min) (ACUTE ONLY): 25 min  Charges:  $Gait Training: 8-22 mins $Therapeutic Exercise: 8-22 mins                     Magda Kiel, PT Acute Rehabilitation Services [phone] 01/22/2018    [name] 01/22/2018, 12:14 PM

## 2018-01-22 NOTE — Plan of Care (Signed)

## 2018-01-22 NOTE — Progress Notes (Signed)
Spoke with [place], [place] RE: pt fever of 101.5. pt stated he was unable to take tylenol due to allergy. New order for motrin 800mg . Verified with patient that he is able to take this and he has no known allergy to ibuprofen and has taken in the past without incident. IS education given with teach back.  Julio Alm, RN

## 2018-01-22 NOTE — Care Management Note (Signed)
Case Management Note  Patient Details  Name: Philip Stone MRN: [id number] Date of Birth: 10/17/1950  Subjective/Objective:     Right TKA               Action/Plan: NCM spoke to pt and offered choice for HH/CMS list provided and placed on chart. Pt agreeable to [facility] for [place]. States he has RW at home. Contacted [facility] for 3n1, delivered to room. Girlfriend will assist with care.   Expected Discharge Date:  01/21/18               Expected Discharge Plan:  [place]  In-House Referral:  NA  Discharge planning Services  CM Consult  Post Acute Care Choice:  Home Health Choice offered to:  Patient  DME Arranged:  3-N-1 DME Agency:  [place]:  [place] Agency:  [facility] (formerly [facility]  Status of Service:  Completed, signed off  If discussed at [employer] of Stay Meetings, dates discussed:    Additional Comments:  Erenest Rasher, RN 01/22/2018, 12:42 PM

## 2018-01-22 NOTE — Progress Notes (Signed)
   Subjective: 1 Day Post-Op Procedure(s) (LRB): TOTAL KNEE ARTHROPLASTY (Right)  Pt c/o mild to moderate soreness but overall doing well Denies any new symptoms or issues Ready for therapy this morning Patient reports pain as moderate.  Objective:   VITALS:   Vitals:   01/22/18 0136 01/22/18 0600  BP: 132/74 (!) 148/77  Pulse: 64 69  Resp: 16 16  Temp: 98.2 F (36.8 C) 98.2 F (36.8 C)  SpO2: 100% 100%    Right knee dressing intact Drain pulled nv intact distally No rashes or edema distally  LABS Recent Labs    01/19/18 1522 01/22/18 0328  HGB 14.3 11.5*  HCT 43.0 35.2*  WBC 6.3 15.9*  PLT 116* 108*    Recent Labs    01/19/18 1522 01/22/18 0328  NA 141 138  K 4.8 4.2  BUN 16 19  CREATININE 1.07 1.01  GLUCOSE 106* 187*     Assessment/Plan: 1 Day Post-Op Procedure(s) (LRB): TOTAL KNEE ARTHROPLASTY (Right) PT/OT Pulmonary toilet Pain management Plan for probable d/c tomorrow    Merla Riches PA-C, [place] is now [facility]  [place] [address] [place], [place], [place] [address] Phone: [phone] [email or web address] Facebook  [employer]

## 2018-01-23 LAB — CBC
HCT: 28.3 % — ABNORMAL LOW (ref 39.0–52.0)
Hemoglobin: 9.3 g/dL — ABNORMAL LOW (ref 13.0–17.0)
MCH: 29.7 pg (ref 26.0–34.0)
MCHC: 32.9 g/dL (ref 30.0–36.0)
MCV: 90.4 fL (ref 80.0–100.0)
Platelets: 84 10*3/uL — ABNORMAL LOW (ref 150–400)
RBC: 3.13 MIL/uL — ABNORMAL LOW (ref 4.22–5.81)
RDW: 13.7 % (ref 11.5–15.5)
WBC: 9 10*3/uL (ref 4.0–10.5)
nRBC: 0 % (ref 0.0–0.2)

## 2018-01-23 NOTE — Progress Notes (Signed)
   Subjective: 2 Days Post-Op Procedure(s) (LRB): TOTAL KNEE ARTHROPLASTY (Right)  Pt doing well Slight fever yesterday responded well to motrin Minimal drainage from wound due to lovenox Ready for d/c home today Patient reports pain as mild.  Objective:   VITALS:   Vitals:   01/22/18 2143 01/23/18 0449  BP:  115/70  Pulse:  67  Resp:  16  Temp: 99.9 F (37.7 C) 98.9 F (37.2 C)  SpO2:  98%    Right knee dressing changed due to mild drainage from lovenox nv intact distally No rashes or edema distally Good rom  LABS Recent Labs    01/22/18 0328 01/23/18 0419  HGB 11.5* 9.3*  HCT 35.2* 28.3*  WBC 15.9* 9.0  PLT 108* 84*    Recent Labs    01/22/18 0328  NA 138  K 4.2  BUN 19  CREATININE 1.01  GLUCOSE 187*     Assessment/Plan: 2 Days Post-Op Procedure(s) (LRB): TOTAL KNEE ARTHROPLASTY (Right) D/c home today after therapy F/u in 2 weeks Pt in agreement    Brad Fremont Skalicky PA-C, [place] is now [employer] [address] [place], [place], [place] [address] Phone: [phone] [email or web address] Facebook  [employer]

## 2018-01-23 NOTE — Progress Notes (Signed)
Physical Therapy Treatment Patient Details Name: Philip Stone MRN: [id number] DOB: 1950-02-18 Today's Date: 01/23/2018    History of Present Illness Patient is a 67 y/o male admitted for R TKA.  PMH positive for Charcot-Marie-Tooth with R foot drop and h/o neuropathy, arthritis, cervical spinal surgery and foot arthroscopy.      PT Comments    Reviewed elevation of the  Right leg higher than heart and elevate ankle to stretch right knee, wife present.  Follow Up Recommendations  Follow surgeon's recommendation for DC plan and follow-up therapies     Equipment Recommendations  None recommended by PT    Recommendations for Other Services       Precautions / Restrictions Precautions Precautions: Fall;Knee Required Braces or Orthoses: Knee Immobilizer - Right Knee Immobilizer - Right: Discontinue once straight leg raise with < 10 degree lag    Mobility  Bed Mobility               General bed mobility comments: in rcliner  Transfers Overall transfer level: Needs assistance Equipment used: Rolling walker (2 wheeled) Transfers: Sit to/from Stand Sit to Stand: Supervision         General transfer comment: up from recliner with armrests  Ambulation/Gait Ambulation/Gait assistance: Supervision Gait Distance (Feet): 50 Feet Assistive device: Rolling walker (2 wheeled) Gait Pattern/deviations: Step-to pattern     General Gait Details: patient used Bil AFO's   Stairs Stairs: Yes Stairs assistance: Min guard Stair Management: Two rails;Step to pattern;Forwards Number of Stairs: 2 General stair comments: cues for sequence, assist for safety   Wheelchair Mobility    Modified Rankin (Stroke Patients Only)       Balance                                            Cognition Arousal/Alertness: Awake/alert                                            Exercises      General Comments        Pertinent Vitals/Pain  Pain Score: 5  Pain Location: R knee Pain Descriptors / Indicators: Aching;Operative site guarding Pain Intervention(s): Monitored during session;Premedicated before session;Ice applied    Home Living                      Prior Function            PT Goals (current goals can now be found in the care plan section) Progress towards PT goals: Progressing toward goals    Frequency    7X/week      PT Plan Current plan remains appropriate    Co-evaluation              AM-PAC PT "6 Clicks" Mobility   Outcome Measure  Help needed turning from your back to your side while in a flat bed without using bedrails?: A Little Help needed moving from lying on your back to sitting on the side of a flat bed without using bedrails?: A Little Help needed moving to and from a bed to a chair (including a wheelchair)?: A Little Help needed standing up from a chair using your arms (e.g., wheelchair or bedside chair)?: A Little Help needed to  walk in hospital room?: A Little Help needed climbing 3-5 steps with a railing? : A Little 6 Click Score: 18    End of Session Equipment Utilized During Treatment: Gait belt;Right knee immobilizer Activity Tolerance: Patient tolerated treatment well Patient left: in chair;with call bell/phone within reach Nurse Communication: Mobility status PT Visit Diagnosis: Other abnormalities of gait and mobility (R26.89)     Time: 6720-9198 PT Time Calculation (min) (ACUTE ONLY): 19 min  Charges:  $Gait Training: 8-22 mins                     Tresa Endo PT Acute Rehabilitation Services Pager [phone] Office [phone]    Claretha Cooper 01/23/2018, 3:09 PM

## 2018-01-23 NOTE — Discharge Summary (Signed)
Orthopedic Discharge Summary        Physician Discharge Summary  Patient ID: Philip Stone MRN: [id number] DOB/AGE: Jan 19, 1951 67 y.o.  Admit date: 01/21/2018 Discharge date: 01/23/2018   Procedures:  Procedure(s) (LRB): TOTAL KNEE ARTHROPLASTY (Right)  Attending Physician:  Dr. Theda Sers  Admission Diagnoses:   Right knee end stage osteoarthritis  Discharge Diagnoses:  Right knee end stage osteoarthritis   Past Medical History:  Diagnosis Date  . Arthritis   . BPH (benign prostatic hyperplasia)   . Charcot-Marie-Tooth disease   . Foot drop, right   . Neuropathy   . Platelets decreased ([place])   . Spinal stenosis     PCP: Claretta Fraise, MD   Discharged Condition: good  Hospital Course:  Patient underwent the above stated procedure on 01/21/2018. Patient tolerated the procedure well and brought to the recovery room in good condition and subsequently to the floor. Patient had an uncomplicated hospital course and was stable for discharge.   Disposition: Discharge disposition: 01-Home or Self Care      with follow up in 2 weeks   Follow-up Information    [facility].   Specialty:  [place] Why:  [place] will call to arrange appt Contact information: [place] [address] [phone]           Discharge Instructions    Call MD / Call 911   Complete by:  As directed    If you experience chest pain or shortness of breath, CALL 911 and be transported to the hospital emergency room.  If you develope a fever above 101 F, pus (white drainage) or increased drainage or redness at the wound, or calf pain, call your surgeon's office.   Call MD / Call 911   Complete by:  As directed    If you experience chest pain or shortness of breath, CALL 911 and be transported to the hospital emergency room.  If you develope a fever above 101 F, pus (white drainage) or increased drainage or redness  at the wound, or calf pain, call your surgeon's office.   Constipation Prevention   Complete by:  As directed    Drink plenty of fluids.  Prune juice may be helpful.  You may use a stool softener, such as Colace (over the counter) 100 mg twice a day.  Use MiraLax (over the counter) for constipation as needed.   Constipation Prevention   Complete by:  As directed    Drink plenty of fluids.  Prune juice may be helpful.  You may use a stool softener, such as Colace (over the counter) 100 mg twice a day.  Use MiraLax (over the counter) for constipation as needed.   Diet - low sodium heart healthy   Complete by:  As directed    Diet - low sodium heart healthy   Complete by:  As directed    Discharge instructions   Complete by:  As directed    INSTRUCTIONS AFTER JOINT REPLACEMENT   Remove items at home which could result in a fall. This includes throw rugs or furniture in walking pathways ICE to the affected joint every three hours while awake for 30 minutes at a time, for at least the first 3-5 days, and then as needed for pain and swelling.  Continue to use ice for pain and swelling. You may notice swelling that will progress down to the foot and ankle.  This is normal after  surgery.  Elevate your leg when you are not up walking on it.   Continue to use the breathing machine you got in the hospital (incentive spirometer) which will help keep your temperature down.  It is common for your temperature to cycle up and down following surgery, especially at night when you are not up moving around and exerting yourself.  The breathing machine keeps your lungs expanded and your temperature down.   DIET:  As you were doing prior to hospitalization, we recommend a well-balanced diet.  DRESSING / WOUND CARE / SHOWERING  Keep the surgical dressing until follow up.  The dressing is water proof, so you can shower without any extra covering.  IF THE DRESSING FALLS OFF or the wound gets wet inside, change the  dressing with sterile gauze.  Please use good hand washing techniques before changing the dressing.  Do not use any lotions or creams on the incision until instructed by your surgeon.    ACTIVITY  Increase activity slowly as tolerated, but follow the weight bearing instructions below.   No driving for 6 weeks or until further direction given by your physician.  You cannot drive while taking narcotics.  No lifting or carrying greater than 10 lbs. until further directed by your surgeon. Avoid periods of inactivity such as sitting longer than an hour when not asleep. This helps prevent blood clots.  You may return to work once you are authorized by your doctor.     WEIGHT BEARING   Weight bearing as tolerated with assist device (walker, cane, etc) as directed, use it as long as suggested by your surgeon or therapist, typically at least 4-6 weeks.   EXERCISES  Results after joint replacement surgery are often greatly improved when you follow the exercise, range of motion and muscle strengthening exercises prescribed by your doctor. Safety measures are also important to protect the joint from further injury. Any time any of these exercises cause you to have increased pain or swelling, decrease what you are doing until you are comfortable again and then slowly increase them. If you have problems or questions, call your caregiver or physical therapist for advice.   Rehabilitation is important following a joint replacement. After just a few days of immobilization, the muscles of the leg can become weakened and shrink (atrophy).  These exercises are designed to build up the tone and strength of the thigh and leg muscles and to improve motion. Often times heat used for twenty to thirty minutes before working out will loosen up your tissues and help with improving the range of motion but do not use heat for the first two weeks following surgery (sometimes heat can increase post-operative swelling).    These exercises can be done on a training (exercise) mat, on the floor, on a table or on a bed. Use whatever works the best and is most comfortable for you.    Use music or television while you are exercising so that the exercises are a pleasant break in your day. This will make your life better with the exercises acting as a break in your routine that you can look forward to.   Perform all exercises about fifteen times, three times per day or as directed.  You should exercise both the operative leg and the other leg as well.   Exercises include:   Quad Sets - Tighten up the muscle on the front of the thigh (Quad) and hold for 5-10 seconds.   Straight Leg Raises -  With your knee straight (if you were given a brace, keep it on), lift the leg to 60 degrees, hold for 3 seconds, and slowly lower the leg.  Perform this exercise against resistance later as your leg gets stronger.  Leg Slides: Lying on your back, slowly slide your foot toward your buttocks, bending your knee up off the floor (only go as far as is comfortable). Then slowly slide your foot back down until your leg is flat on the floor again.  Angel Wings: Lying on your back spread your legs to the side as far apart as you can without causing discomfort.  Hamstring Strength:  Lying on your back, push your heel against the floor with your leg straight by tightening up the muscles of your buttocks.  Repeat, but this time bend your knee to a comfortable angle, and push your heel against the floor.  You may put a pillow under the heel to make it more comfortable if necessary.   A rehabilitation program following joint replacement surgery can speed recovery and prevent re-injury in the future due to weakened muscles. Contact your doctor or a physical therapist for more information on knee rehabilitation.    CONSTIPATION  Constipation is defined medically as fewer than three stools per week and severe constipation as less than one stool per week.   Even if you have a regular bowel pattern at home, your normal regimen is likely to be disrupted due to multiple reasons following surgery.  Combination of anesthesia, postoperative narcotics, change in appetite and fluid intake all can affect your bowels.   YOU MUST use at least one of the following options; they are listed in order of increasing strength to get the job done.  They are all available over the counter, and you may need to use some, POSSIBLY even all of these options:    Drink plenty of fluids (prune juice may be helpful) and high fiber foods Colace 100 mg by mouth twice a day  Senokot for constipation as directed and as needed Dulcolax (bisacodyl), take with full glass of water  Miralax (polyethylene glycol) once or twice a day as needed.  If you have tried all these things and are unable to have a bowel movement in the first 3-4 days after surgery call either your surgeon or your primary doctor.    If you experience loose stools or diarrhea, hold the medications until you stool forms back up.  If your symptoms do not get better within 1 week or if they get worse, check with your doctor.  If you experience "the worst abdominal pain ever" or develop nausea or vomiting, please contact the office immediately for further recommendations for treatment.   ITCHING:  If you experience itching with your medications, try taking only a single pain pill, or even half a pain pill at a time.  You can also use Benadryl over the counter for itching or also to help with sleep.   TED HOSE STOCKINGS:  Use stockings on both legs until for at least 2 weeks or as directed by physician office. They may be removed at night for sleeping.  MEDICATIONS:  See your medication summary on the "After Visit Summary" that nursing will review with you.  You may have some home medications which will be placed on hold until you complete the course of blood thinner medication.  It is important for you to complete the  blood thinner medication as prescribed.  PRECAUTIONS:  If you experience chest  pain or shortness of breath - call 911 immediately for transfer to the hospital emergency department.   If you develop a fever greater that 101 F, purulent drainage from wound, increased redness or drainage from wound, foul odor from the wound/dressing, or calf pain - CONTACT YOUR SURGEON.                                                   FOLLOW-UP APPOINTMENTS:  If you do not already have a post-op appointment, please call the office for an appointment to be seen by your surgeon.  Guidelines for how soon to be seen are listed in your "After Visit Summary", but are typically between 1-4 weeks after surgery.  OTHER INSTRUCTIONS:   Knee Replacement:  Do not place pillow under knee, focus on keeping the knee straight while resting. CPM instructions: 0-90 degrees, 2 hours in the morning, 2 hours in the afternoon, and 2 hours in the evening. Place foam block, curve side up under heel at all times except when in CPM or when walking.  DO NOT modify, tear, cut, or change the foam block in any way.  MAKE SURE YOU:  Understand these instructions.  Get help right away if you are not doing well or get worse.    Thank you for letting us be a part of your medical care team.  It is a privilege we respect greatly.  We hope these instructions will help you stay on track for a fast and full recovery!   Increase activity slowly as tolerated   Complete by:  As directed    Increase activity slowly as tolerated   Complete by:  As directed       Allergies as of 01/23/2018      Reactions   Asa [aspirin] Anaphylaxis   Prednisone    Makes him lightheaded      Medication List    STOP taking these medications   Baclofen 5 MG Tabs   meloxicam 15 MG tablet Commonly known as:  MOBIC     TAKE these medications   methocarbamol 500 MG tablet Commonly known as:  ROBAXIN Take 1 tablet (500 mg total) by mouth every 8 (eight) hours  as needed for muscle spasms.   multivitamin with minerals Tabs tablet Take 1 tablet by mouth daily.   oxyCODONE 5 MG immediate release tablet Commonly known as:  ROXICODONE Take 1 tablet (5 mg total) by mouth every 4 (four) hours as needed for up to 7 days.   pantoprazole 40 MG tablet Commonly known as:  PROTONIX Take 1 tablet (40 mg total) by mouth daily. For stomach   rivaroxaban 10 MG Tabs tablet Commonly known as:  XARELTO Take 1 tablet (10 mg total) by mouth daily.   tamsulosin 0.4 MG Caps capsule Commonly known as:  FLOMAX TAKE TWO CAPSULES BY MOUTH DAILY AT BEDTIME            Durable Medical Equipment  (From admission, onward)         Start     Ordered   01/22/18 1153  For home use only DME 3 n 1  Once     01/22/18 1152            Signed: Ventura Bruns 01/23/2018, 7:26 AM  [facility] is now [employer] [address] [employer]  [address], [address], [place], [place] [address] Phone: [place]

## 2018-01-23 NOTE — Progress Notes (Addendum)
Physical Therapy Treatment Patient Details Name: Philip Stone MRN: [id number] DOB: [birth year] Today's Date: 01/23/2018    History of Present Illness Patient is a 67 y/o male admitted for R TKA.  PMH positive for Charcot-Marie-Tooth with R foot drop and h/o neuropathy, arthritis, cervical spinal surgery and foot arthroscopy.      PT Comments    Patient is progressing well. Will practice steps after shoes/braces arrive. Then Dc .  Follow Up Recommendations  Follow surgeon's recommendation for DC plan and follow-up therapies     Equipment Recommendations  None recommended by PT    Recommendations for Other Services       Precautions / Restrictions Did use KI this visit.  Needs AFO's    Mobility  Bed Mobility         Supine to sit: Supervision;HOB elevated        Transfers Overall transfer level: Needs assistance Equipment used: Rolling walker (2 wheeled) Transfers: Sit to/from Stand Sit to Stand: Supervision            Ambulation/Gait Ambulation/Gait assistance: Supervision Gait Distance (Feet): 180 Feet Assistive device: Rolling walker (2 wheeled) Gait Pattern/deviations: Step-through pattern;Decreased stride length;Step-to pattern;Antalgic     General Gait Details: heavy walker use when weight bearing on R, using slippers and heel lift on R   Stairs             Wheelchair Mobility    Modified Rankin (Stroke Patients Only)       Balance                                            Cognition Arousal/Alertness: Awake/alert                                            Exercises Total Joint Exercises Ankle Circles/Pumps: AROM;10 reps;Both;Supine Quad Sets: AROM;Both;Supine;10 reps Heel Slides: AAROM;Right;Supine;10 reps Hip ABduction/ADduction: AROM;10 reps;Supine;Right Straight Leg Raises: AROM;10 reps;Supine;Right Goniometric ROM: 15-65 right knee    General Comments        Pertinent  Vitals/Pain Pain Score: 4  Pain Location: R knee Pain Descriptors / Indicators: Aching;Operative site guarding Pain Intervention(s): Monitored during session;Ice applied    Home Living                      Prior Function            PT Goals (current goals can now be found in the care plan section) Progress towards PT goals: Progressing toward goals    Frequency    7X/week      PT Plan Current plan remains appropriate    Co-evaluation              AM-PAC PT "6 Clicks" Mobility   Outcome Measure  Help needed turning from your back to your side while in a flat bed without using bedrails?: A Little Help needed moving from lying on your back to sitting on the side of a flat bed without using bedrails?: A Little Help needed moving to and from a bed to a chair (including a wheelchair)?: A Little Help needed standing up from a chair using your arms (e.g., wheelchair or bedside chair)?: A Little Help needed to walk in hospital room?: A Little  Help needed climbing 3-5 steps with a railing? : A Little 6 Click Score: 18    End of Session Equipment Utilized During Treatment: Gait belt Activity Tolerance: Patient tolerated treatment well Patient left: in chair;with call bell/phone within reach Nurse Communication: Mobility status PT Visit Diagnosis: Other abnormalities of gait and mobility (R26.89)     Time: 6945-0388 PT Time Calculation (min) (ACUTE ONLY): 29 min  Charges:  $Gait Training: 8-22 mins $Therapeutic Exercise: 8-22 mins                      Tresa Endo PT Acute Rehabilitation Services Pager [phone] Office [phone]    Claretha Cooper 01/23/2018, 11:06 AM

## 2018-01-24 ENCOUNTER — Ambulatory Visit: Payer: PPO | Admitting: Physical Therapy

## 2018-01-24 ENCOUNTER — Encounter (HOSPITAL_COMMUNITY): Payer: Self-pay | Admitting: Specialist

## 2018-01-25 ENCOUNTER — Ambulatory Visit: Payer: PPO | Attending: Specialist | Admitting: Physical Therapy

## 2018-01-25 DIAGNOSIS — K210 Gastro-esophageal reflux disease with esophagitis: Secondary | ICD-10-CM | POA: Diagnosis not present

## 2018-01-25 DIAGNOSIS — G600 Hereditary motor and sensory neuropathy: Secondary | ICD-10-CM | POA: Diagnosis not present

## 2018-01-25 DIAGNOSIS — M21371 Foot drop, right foot: Secondary | ICD-10-CM | POA: Diagnosis not present

## 2018-01-25 DIAGNOSIS — Z7901 Long term (current) use of anticoagulants: Secondary | ICD-10-CM | POA: Diagnosis not present

## 2018-01-25 DIAGNOSIS — N400 Enlarged prostate without lower urinary tract symptoms: Secondary | ICD-10-CM | POA: Diagnosis not present

## 2018-01-25 DIAGNOSIS — Z87891 Personal history of nicotine dependence: Secondary | ICD-10-CM | POA: Diagnosis not present

## 2018-01-25 DIAGNOSIS — Z9181 History of falling: Secondary | ICD-10-CM | POA: Diagnosis not present

## 2018-01-25 DIAGNOSIS — M4802 Spinal stenosis, cervical region: Secondary | ICD-10-CM | POA: Diagnosis not present

## 2018-01-25 DIAGNOSIS — Z471 Aftercare following joint replacement surgery: Secondary | ICD-10-CM | POA: Diagnosis not present

## 2018-01-25 DIAGNOSIS — Z96651 Presence of right artificial knee joint: Secondary | ICD-10-CM | POA: Diagnosis not present

## 2018-02-04 DIAGNOSIS — M1711 Unilateral primary osteoarthritis, right knee: Secondary | ICD-10-CM | POA: Diagnosis not present

## 2018-02-05 DIAGNOSIS — Z87891 Personal history of nicotine dependence: Secondary | ICD-10-CM | POA: Diagnosis not present

## 2018-02-05 DIAGNOSIS — K210 Gastro-esophageal reflux disease with esophagitis: Secondary | ICD-10-CM | POA: Diagnosis not present

## 2018-02-05 DIAGNOSIS — N400 Enlarged prostate without lower urinary tract symptoms: Secondary | ICD-10-CM | POA: Diagnosis not present

## 2018-02-05 DIAGNOSIS — M21371 Foot drop, right foot: Secondary | ICD-10-CM | POA: Diagnosis not present

## 2018-02-05 DIAGNOSIS — Z9181 History of falling: Secondary | ICD-10-CM | POA: Diagnosis not present

## 2018-02-05 DIAGNOSIS — M4802 Spinal stenosis, cervical region: Secondary | ICD-10-CM | POA: Diagnosis not present

## 2018-02-05 DIAGNOSIS — G600 Hereditary motor and sensory neuropathy: Secondary | ICD-10-CM | POA: Diagnosis not present

## 2018-02-05 DIAGNOSIS — Z7901 Long term (current) use of anticoagulants: Secondary | ICD-10-CM | POA: Diagnosis not present

## 2018-02-05 DIAGNOSIS — Z471 Aftercare following joint replacement surgery: Secondary | ICD-10-CM | POA: Diagnosis not present

## 2018-02-05 DIAGNOSIS — Z96651 Presence of right artificial knee joint: Secondary | ICD-10-CM | POA: Diagnosis not present

## 2018-02-08 DIAGNOSIS — M4802 Spinal stenosis, cervical region: Secondary | ICD-10-CM | POA: Diagnosis not present

## 2018-02-08 DIAGNOSIS — M21371 Foot drop, right foot: Secondary | ICD-10-CM | POA: Diagnosis not present

## 2018-02-08 DIAGNOSIS — Z87891 Personal history of nicotine dependence: Secondary | ICD-10-CM | POA: Diagnosis not present

## 2018-02-08 DIAGNOSIS — G600 Hereditary motor and sensory neuropathy: Secondary | ICD-10-CM | POA: Diagnosis not present

## 2018-02-08 DIAGNOSIS — Z9181 History of falling: Secondary | ICD-10-CM | POA: Diagnosis not present

## 2018-02-08 DIAGNOSIS — Z471 Aftercare following joint replacement surgery: Secondary | ICD-10-CM | POA: Diagnosis not present

## 2018-02-08 DIAGNOSIS — N400 Enlarged prostate without lower urinary tract symptoms: Secondary | ICD-10-CM | POA: Diagnosis not present

## 2018-02-08 DIAGNOSIS — Z7901 Long term (current) use of anticoagulants: Secondary | ICD-10-CM | POA: Diagnosis not present

## 2018-02-08 DIAGNOSIS — Z96651 Presence of right artificial knee joint: Secondary | ICD-10-CM | POA: Diagnosis not present

## 2018-02-08 DIAGNOSIS — K210 Gastro-esophageal reflux disease with esophagitis: Secondary | ICD-10-CM | POA: Diagnosis not present

## 2018-02-11 ENCOUNTER — Other Ambulatory Visit: Payer: Self-pay | Admitting: Pharmacist

## 2018-02-11 NOTE — Patient Outreach (Signed)
[name]) Care Management  Philip Stone   02/11/2018  [name] [birth year] [id number]  Reason for referral: Medication Reconciliation Post Discharge  Referral source: [employer]  PMHx includes but not limited to: knee arthritis s/p knee replacement; charcot-marie-tooth disease, BPH, GERD  Outreach:  Successful telephone call with patient.  HIPAA identifiers verified.   Subjective:  Patient was admitted to [facility] 12/13-12/15/2019 for elective total knee arthroplasty. He notes that he is healing well, though still endorsing some pain. He indicates that he is unsure of the intended Xarelto or cephalexin prescription duration. He denies any other questions or concerns regarding medications at this time, including concerns with medication affordability.   Objective: A1c 5.2% (05/01/2015) LDL 127, TC 192, TG 72, HDL 50 (05/01/2015)  Lab Results  Component Value Date   CREATININE 1.01 01/22/2018   CREATININE 1.07 01/19/2018   CREATININE 1.02 12/12/2015  eGFR >60 mL/min, CrCl ~ 66 mL/min  BP Readings from Last 3 Encounters:  01/23/18 (!) 145/67  01/19/18 (!) 161/86  11/16/17 (!) 181/93    Allergies  Allergen Reactions  . Asa [Aspirin] Anaphylaxis  . Prednisone     Makes him lightheaded    Medications Reviewed Today    Reviewed by De Hollingshead, [facility] (Pharmacist) on 02/11/18 at 1413  Med List Status: <None>  Medication Order Taking? Sig Documenting Provider Last Dose Status Informant  cephALEXin (KEFLEX) 500 MG capsule [id number] Yes Take 500 mg by mouth 2 (two) times daily. [provider] Taking Active            Med Note Darnelle Maffucci, Arville Lime   Fri Feb 11, 2018  2:06 PM) Pt reports quantity of 30  cyclobenzaprine (FLEXERIL) 10 MG tablet [id number] Yes Take 10 mg by mouth 3 (three) times daily as needed for muscle spasms. [provider] Taking Active            Med Note Darnelle Maffucci, Arville Lime   Fri Feb 11, 2018  2:04 PM) Taking TID        Discontinued 02/11/18 1404 (Change in therapy)   Multiple Vitamin (MULTIVITAMIN WITH MINERALS) TABS tablet [id number] No Take 1 tablet by mouth daily. [provider] Not Taking Active Self  oxyCODONE-acetaminophen (PERCOCET/ROXICET) 5-325 MG tablet [id number] Yes Take 1 tablet by mouth every 4 (four) hours as needed for pain.  [provider] Taking Active            Med Note Darnelle Maffucci, Arville Lime   Fri Feb 11, 2018  2:05 PM) Patient taking Q4H  pantoprazole (PROTONIX) 40 MG tablet [id number] Yes Take 1 tablet (40 mg total) by mouth daily. For stomach Claretta Fraise, MD Taking Active Self  rivaroxaban (XARELTO) 10 MG TABS tablet [id number] Yes Take 1 tablet (10 mg total) by mouth daily. Lajean Manes, PA-C Taking Active   tamsulosin (FLOMAX) 0.4 MG CAPS capsule [id number] Yes TAKE TWO CAPSULES BY MOUTH DAILY AT BEDTIME Claretta Fraise, MD Taking Active           Assessment:  Date Discharged from Hospital: 01/21/2018 Date Medication Reconciliation Performed: 02/11/2018  Medications:  New at Discharge: . Xarelto  . Oxycodone (now using oxycodone-acetaminophen) . Methocarbamol (now using cyclobenzaprine) . Cephalexin (added after discharge)  Discontinued at Discharge:   Baclofen  Meloxicam  Patient was recently discharged from hospital and all medications have been reviewed.  Drugs sorted by system:  Cardiovascular: Xarelto  Gastrointestinal: pantoprazole  Pain: cyclobenzaprine, oxycodone-acetaminophen  Infectious Diseases: cephalexin  Genitourinary: tamsulosin   Medication Review Findings:  . Patient uncertain of intended duration of therapy of Xarelto and cephalexin; likely, intention was for him to continue until he completed the prescribed amount.  . Labs: patient will be due for A1c check this year for screening for diabetes  Plan: - Will route message to Dr. Livia Snellen for [place] - Will contact Dr. Theda Sers at [facility]  for clarification regarding intended duration of Xarelto and cephalexin prescriptions - Patient has my contact information for any future questions or concerns  Catie Darnelle Maffucci, PharmD PGY2 Ambulatory Care Pharmacy Resident, [place] Phone: [phone]

## 2018-02-11 NOTE — Patient Outreach (Signed)
[facility]) Care Management  02/11/2018  Philip Stone [birth year] [id number]   Contacted [facility]. Confirmed that patient is to continue Xarelto for a total of 21 days of therapy. They noted that cephalexin is to be taken three times daily for 10 days, though patient had told me twice daily. Contacted patient to clarify; he confirmed that the bottle states three times daily and that he will take it as intended.   Catie Darnelle Maffucci, PharmD PGY2 Ambulatory Care Pharmacy Resident, [employer] [facility] Phone: [phone]

## 2018-02-16 ENCOUNTER — Other Ambulatory Visit: Payer: Self-pay | Admitting: Family Medicine

## 2018-02-16 DIAGNOSIS — K210 Gastro-esophageal reflux disease with esophagitis: Secondary | ICD-10-CM | POA: Diagnosis not present

## 2018-02-16 DIAGNOSIS — Z96651 Presence of right artificial knee joint: Secondary | ICD-10-CM | POA: Diagnosis not present

## 2018-02-16 DIAGNOSIS — G600 Hereditary motor and sensory neuropathy: Secondary | ICD-10-CM | POA: Diagnosis not present

## 2018-02-16 DIAGNOSIS — M21371 Foot drop, right foot: Secondary | ICD-10-CM | POA: Diagnosis not present

## 2018-02-16 DIAGNOSIS — Z87891 Personal history of nicotine dependence: Secondary | ICD-10-CM | POA: Diagnosis not present

## 2018-02-16 DIAGNOSIS — N400 Enlarged prostate without lower urinary tract symptoms: Secondary | ICD-10-CM | POA: Diagnosis not present

## 2018-02-16 DIAGNOSIS — Z9181 History of falling: Secondary | ICD-10-CM | POA: Diagnosis not present

## 2018-02-16 DIAGNOSIS — M4802 Spinal stenosis, cervical region: Secondary | ICD-10-CM | POA: Diagnosis not present

## 2018-02-16 DIAGNOSIS — Z471 Aftercare following joint replacement surgery: Secondary | ICD-10-CM | POA: Diagnosis not present

## 2018-02-16 DIAGNOSIS — Z7901 Long term (current) use of anticoagulants: Secondary | ICD-10-CM | POA: Diagnosis not present

## 2018-02-21 ENCOUNTER — Ambulatory Visit: Payer: PPO | Attending: Specialist | Admitting: Physical Therapy

## 2018-02-21 ENCOUNTER — Other Ambulatory Visit: Payer: Self-pay

## 2018-02-21 DIAGNOSIS — M25561 Pain in right knee: Secondary | ICD-10-CM | POA: Insufficient documentation

## 2018-02-21 DIAGNOSIS — R600 Localized edema: Secondary | ICD-10-CM | POA: Diagnosis not present

## 2018-02-21 DIAGNOSIS — M25661 Stiffness of right knee, not elsewhere classified: Secondary | ICD-10-CM | POA: Diagnosis not present

## 2018-02-21 DIAGNOSIS — G8929 Other chronic pain: Secondary | ICD-10-CM | POA: Diagnosis not present
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
## 2018-02-24 ENCOUNTER — Ambulatory Visit: Payer: PPO | Admitting: *Deleted

## 2018-02-24 DIAGNOSIS — M25561 Pain in right knee: Principal | ICD-10-CM

## 2018-02-24 DIAGNOSIS — M25661 Stiffness of right knee, not elsewhere classified: Secondary | ICD-10-CM

## 2018-02-24 DIAGNOSIS — G8929 Other chronic pain: Secondary | ICD-10-CM

## 2018-02-24 DIAGNOSIS — R600 Localized edema: Secondary | ICD-10-CM
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
## 2018-03-01 ENCOUNTER — Ambulatory Visit: Payer: PPO | Admitting: *Deleted

## 2018-03-01 DIAGNOSIS — R600 Localized edema: Secondary | ICD-10-CM

## 2018-03-01 DIAGNOSIS — M25661 Stiffness of right knee, not elsewhere classified: Secondary | ICD-10-CM

## 2018-03-01 DIAGNOSIS — G8929 Other chronic pain: Secondary | ICD-10-CM

## 2018-03-01 DIAGNOSIS — M25561 Pain in right knee: Principal | ICD-10-CM

## 2018-03-01 NOTE — Therapy (Signed)
[facility] [place], [place], [address] Phone: [phone]   Fax:  [phone]  Physical Therapy Treatment  Patient Details  Name: Philip Stone MRN: [id number] Date of Birth: 29-Aug-1950 Referring Provider (PT): Sydnee Cabal MD   Encounter Date: 03/01/2018  PT End of Session - 03/01/18 1500    Visit Number  3    Number of Visits  12    Date for PT Re-Evaluation  03/21/18    Authorization Type  FOTO AT LEAST EVERY 5TH VISIT, 10TH VISIT PROGRESS NOTE AND KX MODIFIER AFTER THE 15 VISIT.    PT Start Time  1345    PT Stop Time  1440    PT Time Calculation (min)  55 min       Past Medical History:  Diagnosis Date  . Arthritis   . BPH (benign prostatic hyperplasia)   . Charcot-Marie-Tooth disease   . Foot drop, right   . Neuropathy   . Platelets decreased ([place])   . Spinal stenosis     Past Surgical History:  Procedure Laterality Date  . CERVICAL DISC SURGERY  10 YEARS AGO   reports there are plates and screws in place   . FOOT ARTHROPLASTY    . INGUINAL HERNIA REPAIR Right 06/15/2014   Procedure: RIGHT INGUINAL HERNIORRHAPHY  WITH MESH;  Surgeon: Aviva Signs Md, MD;  Location: [facility];  Service: General;  Laterality: Right;  . INSERTION OF MESH Right 06/15/2014   Procedure: INSERTION OF MESH RIGHT INGUINAL HERNIORRHAPHY;  Surgeon: Aviva Signs Md, MD;  Location: [facility];  Service: General;  Laterality: Right;  . KNEE ARTHROSCOPY Right   . TOTAL KNEE ARTHROPLASTY Right 01/21/2018   Procedure: TOTAL KNEE ARTHROPLASTY;  Surgeon: Sydnee Cabal, MD;  Location: [facility];  Service: Orthopedics;  Laterality: Right;    There were no vitals filed for this visit.  Subjective Assessment - 03/01/18 1459    Pertinent History  Pt reports doing good after last Rx and continues to work on ROM    Patient Stated Goals  Get knee better.    Pain Score  3     Pain Orientation  Right    Pain Descriptors / Indicators  Aching;Sore    Pain  Type  Surgical pain    Pain Onset  More than a month ago    Pain Frequency  Constant                       [facility] Adult PT Treatment/Exercise - 03/01/18 0001      Exercises   Exercises  Knee/Hip      Knee/Hip Exercises: Aerobic   Recumbent Bike  5 mins seat 7    Nustep  seat  11, 10 L4 x 15 mins  80-150hz        Knee/Hip Exercises: Seated   Long Arc Quad  Right;20 reps;1 set      Modalities   Modalities  Vasopneumatic      Vasopneumatic   Number Minutes Vasopneumatic   15 minutes    Vasopnuematic Location   Knee    Vasopneumatic Pressure  Low    Vasopneumatic Temperature   36      Manual Therapy   Manual Therapy  Passive ROM    Passive ROM  PROM for flexion in sitting and extension in supine.               PT Short Term Goals - 02/21/18 1448  PT SHORT TERM GOAL #1   Title  Full right active knee extension in order to normalize gait.    Time  4    Period  Weeks    Status  New        PT Long Term Goals - 02/21/18 1449      PT LONG TERM GOAL #1   Title  Independent with a HEP.    Time  4    Period  Weeks    Status  New      PT LONG TERM GOAL #2   Title  Active right knee flexion to 115 degrees+ so the patient can perform functional tasks and do so with pain not > 2-3/10.    Time  4    Period  Weeks    Status  New      PT LONG TERM GOAL #3   Title  Increase right knee strength to 5/5 to provide good stability for accomplishment of functional activities.    Time  4    Period  Weeks    Status  New      PT LONG TERM GOAL #4   Title  Decrease edema to within 2.5 cms of non-affected side to assist with pain reduction and range of motion gains.    Time  4    Period  Weeks    Status  New      PT LONG TERM GOAL #5   Title  Perform a reciprocating stair gait with one railing with pain not > 2-3/10.    Time  4    Period  Weeks    Status  New            Plan - 03/01/18 1408    Clinical Impression Statement  pt arrived today  doing a little better with RT knee ROM. He was able to tolerate therex fairly well, but swelling continues. Improved VMO activation today and LAQs were easier and was able to use 2#s.  His incision is healed proximal aspect, but distal part is still with large scabbing and redness in surrounding areas. Normal modality response today    Clinical Presentation  Stable    Clinical Decision Making  Low    Rehab Potential  Good    PT Frequency  2x / week    PT Duration  4 weeks    PT Treatment/Interventions  ADLs/Self Care Home Management;Cryotherapy;Electrical Stimulation;Stair training;Functional mobility training;Gait training;Therapeutic activities;Therapeutic exercise;Patient/family education;Neuromuscular re-education;Manual techniques;Vasopneumatic Device;Passive range of motion    PT Next Visit Plan  VMS to right quadriceps, Nustep, PROM, vasopneumatic.  Progress into total knee protocol.       Patient will benefit from skilled therapeutic intervention in order to improve the following deficits and impairments:  Abnormal gait, Decreased activity tolerance, Decreased range of motion, Decreased strength, Increased edema, Pain  Visit Diagnosis: Chronic pain of right knee  Stiffness of right knee, not elsewhere classified  Localized edema     Problem List Patient Active Problem List   Diagnosis Date Noted  . 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 Cervical stenosis of spinal canal 11/16/2017  . 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 Foot drop, right 07/15/2017  . Gastroesophageal reflux disease with esophagitis 11/30/2016  . Benign prostatic hyperplasia with elevated prostate specific antigen (PSA) 11/30/2016    ,Philip Stone, PTA 03/01/2018, 3:02 PM  [facility] [place], [place], [address] Phone: [phone]   Fax:  [phone]  Name: Philip Stone  MRN: [id number] Date of Birth: 1950/02/29

## 2018-03-03 ENCOUNTER — Ambulatory Visit: Payer: PPO | Admitting: Physical Therapy

## 2018-03-03 DIAGNOSIS — M25661 Stiffness of right knee, not elsewhere classified: Secondary | ICD-10-CM

## 2018-03-03 DIAGNOSIS — M25561 Pain in right knee: Secondary | ICD-10-CM | POA: Diagnosis not present

## 2018-03-03 DIAGNOSIS — G8929 Other chronic pain: Secondary | ICD-10-CM

## 2018-03-03 DIAGNOSIS — R600 Localized edema: Secondary | ICD-10-CM

## 2018-03-03 NOTE — Therapy (Signed)
[facility] [place], [place], [address] Phone: [phone]   Fax:  [phone]  Physical Therapy Treatment  Patient Details  Name: Philip Stone MRN: [id number] Date of Birth: [birth year] Referring Provider (PT): Sydnee Cabal MD   Encounter Date: 03/03/2018  PT End of Session - 03/03/18 1429    Visit Number  4    Number of Visits  12    Date for PT Re-Evaluation  03/21/18    Authorization Type  FOTO AT LEAST EVERY 5TH VISIT, 10TH VISIT PROGRESS NOTE AND KX MODIFIER AFTER THE 15 VISIT.    PT Start Time  0145    PT Stop Time  0239    PT Time Calculation (min)  54 min    Activity Tolerance  Patient tolerated treatment well    Behavior During Therapy  WFL for tasks assessed/performed       Past Medical History:  Diagnosis Date  . Arthritis   . BPH (benign prostatic hyperplasia)   . Charcot-Marie-Tooth disease   . Foot drop, right   . Neuropathy   . Platelets decreased ([place])   . Spinal stenosis     Past Surgical History:  Procedure Laterality Date  . CERVICAL DISC SURGERY  10 YEARS AGO   reports there are plates and screws in place   . FOOT ARTHROPLASTY    . INGUINAL HERNIA REPAIR Right 06/15/2014   Procedure: RIGHT INGUINAL HERNIORRHAPHY  WITH MESH;  Surgeon: Aviva Signs Md, MD;  Location: [facility];  Service: General;  Laterality: Right;  . INSERTION OF MESH Right 06/15/2014   Procedure: INSERTION OF MESH RIGHT INGUINAL HERNIORRHAPHY;  Surgeon: Aviva Signs Md, MD;  Location: [facility];  Service: General;  Laterality: Right;  . KNEE ARTHROSCOPY Right   . TOTAL KNEE ARTHROPLASTY Right 01/21/2018   Procedure: TOTAL KNEE ARTHROPLASTY;  Surgeon: Sydnee Cabal, MD;  [facility];  Service: Orthopedics;  Laterality: Right;    There were no vitals filed for this visit.                    [facility] Adult PT Treatment/Exercise - 03/03/18 0001      Exercises   Exercises  Knee/Hip      Knee/Hip Exercises:  Aerobic   Nustep  Level 4 x 17 minutes moving forward as tolerated to increase right knee flexion.      Knee/Hip Exercises: Machines for Strengthening   Cybex Knee Extension  10# x 3 minutes.    Cybex Knee Flexion  30# x 3 minutes.      Modalities   Modalities  Vasopneumatic      Vasopneumatic   Number Minutes Vasopneumatic   20 minutes    Vasopnuematic Location   --   Right knee.   Vasopneumatic Pressure  Low      Manual Therapy   Manual Therapy  Passive ROM    Passive ROM  PROM (2 minutes of sustaned extension f/b 2 minutes of sustained flexion).               PT Short Term Goals - 02/21/18 1448      PT SHORT TERM GOAL #1   Title  Full right active knee extension in order to normalize gait.    Time  4    Period  Weeks    Status  New        PT Long Term Goals - 02/21/18 1449      PT LONG TERM GOAL #  1   Title  Independent with a HEP.    Time  4    Period  Weeks    Status  New      PT LONG TERM GOAL #2   Title  Active right knee flexion to 115 degrees+ so the patient can perform functional tasks and do so with pain not > 2-3/10.    Time  4    Period  Weeks    Status  New      PT LONG TERM GOAL #3   Title  Increase right knee strength to 5/5 to provide good stability for accomplishment of functional activities.    Time  4    Period  Weeks    Status  New      PT LONG TERM GOAL #4   Title  Decrease edema to within 2.5 cms of non-affected side to assist with pain reduction and range of motion gains.    Time  4    Period  Weeks    Status  New      PT LONG TERM GOAL #5   Title  Perform a reciprocating stair gait with one railing with pain not > 2-3/10.    Time  4    Period  Weeks    Status  New            Plan - 03/03/18 1433    Clinical Impression Statement  Patient doing very well.  He was able to perform weight machines today.    PT Treatment/Interventions  ADLs/Self Care Home Management;Cryotherapy;Electrical Stimulation;Stair  training;Functional mobility training;Gait training;Therapeutic activities;Therapeutic exercise;Patient/family education;Neuromuscular re-education;Manual techniques;Vasopneumatic Device;Passive range of motion    PT Next Visit Plan  VMS to right quadriceps, Nustep, PROM, vasopneumatic.  Progress into total knee protocol.    Consulted and Agree with Plan of Care  Patient       Patient will benefit from skilled therapeutic intervention in order to improve the following deficits and impairments:  Abnormal gait, Decreased activity tolerance, Decreased range of motion, Decreased strength, Increased edema, Pain  Visit Diagnosis: Chronic pain of right knee  Stiffness of right knee, not elsewhere classified  Localized edema     Problem List Patient Active Problem List   Diagnosis Date Noted  . 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 Cervical stenosis of spinal canal 11/16/2017  . 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 Foot drop, right 07/15/2017  . Gastroesophageal reflux disease with esophagitis 11/30/2016  . Benign prostatic hyperplasia with elevated prostate specific antigen (PSA) 11/30/2016    , [place] MPT 03/03/2018, 2:59 PM  [facility] [address] [place], [place], [address] Phone: [phone]   Fax:  [phone]  Name: Philip Stone MRN: [id number] Date of Birth: December 21, 1950

## 2018-03-08 ENCOUNTER — Ambulatory Visit: Payer: PPO | Admitting: *Deleted

## 2018-03-08 DIAGNOSIS — M25561 Pain in right knee: Secondary | ICD-10-CM | POA: Diagnosis not present

## 2018-03-08 DIAGNOSIS — M25661 Stiffness of right knee, not elsewhere classified: Secondary | ICD-10-CM

## 2018-03-08 DIAGNOSIS — R600 Localized edema: Secondary | ICD-10-CM

## 2018-03-08 DIAGNOSIS — G8929 Other chronic pain: Secondary | ICD-10-CM

## 2018-03-08 NOTE — Therapy (Signed)
[facility] [place], [place], [address] Phone: [phone]   Fax:  [phone]  Physical Therapy Treatment  Patient Details  Name: Philip Stone MRN: [id number] Date of Birth: [birth year] Referring Provider (PT): Sydnee Cabal MD   Encounter Date: 03/08/2018  PT End of Session - 03/08/18 1401    Visit Number  5    Number of Visits  12    Date for PT Re-Evaluation  03/21/18    Authorization Type  FOTO AT LEAST EVERY 5TH VISIT, 10TH VISIT PROGRESS NOTE AND KX MODIFIER AFTER THE 15 VISIT.    PT Start Time  1345    PT Stop Time  1444    PT Time Calculation (min)  59 min    Activity Tolerance  Patient tolerated treatment well    Behavior During Therapy  WFL for tasks assessed/performed       Past Medical History:  Diagnosis Date  . Arthritis   . BPH (benign prostatic hyperplasia)   . Charcot-Marie-Tooth disease   . Foot drop, right   . Neuropathy   . Platelets decreased ([place])   . Spinal stenosis     Past Surgical History:  Procedure Laterality Date  . CERVICAL DISC SURGERY  10 YEARS AGO   reports there are plates and screws in place   . FOOT ARTHROPLASTY    . INGUINAL HERNIA REPAIR Right 06/15/2014   Procedure: RIGHT INGUINAL HERNIORRHAPHY  WITH MESH;  Surgeon: Aviva Signs Md, MD;  Location: [facility];  Service: General;  Laterality: Right;  . INSERTION OF MESH Right 06/15/2014   Procedure: INSERTION OF MESH RIGHT INGUINAL HERNIORRHAPHY;  Surgeon: Aviva Signs Md, MD;  Location: [facility];  Service: General;  Laterality: Right;  . KNEE ARTHROSCOPY Right   . TOTAL KNEE ARTHROPLASTY Right 01/21/2018   Procedure: TOTAL KNEE ARTHROPLASTY;  Surgeon: Sydnee Cabal, MD;  [facility];  Service: Orthopedics;  Laterality: Right;    There were no vitals filed for this visit.  Subjective Assessment - 03/08/18 1400    Subjective  Doing better with ROM. Stretching at home. MD 10:45 tomorrow    Pertinent History  Pt reports  doing good after last Rx and continues to work on ROM    Patient Stated Goals  Get knee better.    Currently in Pain?  Yes    Pain Score  3     Pain Location  Knee    Pain Orientation  Right    Pain Descriptors / Indicators  Aching;Sore    Pain Onset  More than a month ago         [facility] PT Assessment - 03/08/18 0001      ROM / Strength   AROM / PROM / Strength  PROM      AROM   Overall AROM   Deficits      PROM   Overall PROM   Deficits    Overall PROM Comments  10-100 degrees                   [facility] Adult PT Treatment/Exercise - 03/08/18 0001      Exercises   Exercises  Knee/Hip      Knee/Hip Exercises: Aerobic   Nustep  Level 4 x 17 minutes moving forward as tolerated to increase right knee flexion.      Knee/Hip Exercises: Machines for Strengthening   Cybex Knee Extension  10# x 3 minutes.    Cybex Knee Flexion  30#  x 3 minutes.      Modalities   Modalities  Vasopneumatic      Vasopneumatic   Number Minutes Vasopneumatic   15 minutes    Vasopnuematic Location   Knee    Vasopneumatic Pressure  Low    Vasopneumatic Temperature   36      Manual Therapy   Manual Therapy  Passive ROM    Passive ROM  PROM for flexion in sitting and extension in supine.               PT Short Term Goals - 02/21/18 1448      PT SHORT TERM GOAL #1   Title  Full right active knee extension in order to normalize gait.    Time  4    Period  Weeks    Status  New        PT Long Term Goals - 02/21/18 1449      PT LONG TERM GOAL #1   Title  Independent with a HEP.    Time  4    Period  Weeks    Status  New      PT LONG TERM GOAL #2   Title  Active right knee flexion to 115 degrees+ so the patient can perform functional tasks and do so with pain not > 2-3/10.    Time  4    Period  Weeks    Status  New      PT LONG TERM GOAL #3   Title  Increase right knee strength to 5/5 to provide good stability for accomplishment of functional activities.    Time   4    Period  Weeks    Status  New      PT LONG TERM GOAL #4   Title  Decrease edema to within 2.5 cms of non-affected side to assist with pain reduction and range of motion gains.    Time  4    Period  Weeks    Status  New      PT LONG TERM GOAL #5   Title  Perform a reciprocating stair gait with one railing with pain not > 2-3/10.    Time  4    Period  Weeks    Status  New            Plan - 03/08/18 1411    Clinical Impression Statement  Pt arrived today reporting that he has been working on ROM at home and that He will F/U with MD tomorrow. He did well with therex with mainly fatigue. PROM was performed in sitting for flexion ROM and measured at 100 degrees today and extension in supine measured at -10 degrees. Pt still with ROM deficits in flexion and extension at this time, but is progressing. RT knee is still with swelling and incision also still with wide  scabbing over mid to distal aspect. Decreased redness in surrounding areas. Normal modality response to Vaso    Clinical Presentation  Stable    Rehab Potential  Good    PT Frequency  2x / week    PT Duration  4 weeks    PT Next Visit Plan  VMS to right quadriceps, Nustep, PROM, vasopneumatic.  Progress into total knee protocol.   Send MD note today    Consulted and Agree with Plan of Care  Patient       Patient will benefit from skilled therapeutic intervention in order to improve the following deficits and  impairments:  Abnormal gait, Decreased activity tolerance, Decreased range of motion, Decreased strength, Increased edema, Pain  Visit Diagnosis: Chronic pain of right knee  Stiffness of right knee, not elsewhere classified  Localized edema     Problem List Patient Active Problem List   Diagnosis Date Noted  . 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 Cervical stenosis of spinal canal 11/16/2017  . 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 Foot drop, right 07/15/2017  . Gastroesophageal reflux disease  with esophagitis 11/30/2016  . Benign prostatic hyperplasia with elevated prostate specific antigen (PSA) 11/30/2016    Aniyla Harling,CHRIS, PTA 03/08/2018, 3:00 PM  [facility] [place], [place], [address] Phone: [phone]   Fax:  [phone]  Name: Philip Stone MRN: [id number] Date of Birth: Mar 08, 1950

## 2018-03-10 ENCOUNTER — Ambulatory Visit: Payer: PPO | Admitting: Physical Therapy

## 2018-03-10 DIAGNOSIS — R600 Localized edema: Secondary | ICD-10-CM

## 2018-03-10 DIAGNOSIS — M25561 Pain in right knee: Secondary | ICD-10-CM | POA: Diagnosis not present

## 2018-03-10 DIAGNOSIS — M25661 Stiffness of right knee, not elsewhere classified: Secondary | ICD-10-CM

## 2018-03-10 DIAGNOSIS — G8929 Other chronic pain: Secondary | ICD-10-CM

## 2018-03-10 NOTE — Therapy (Signed)
[facility] [place], [place], [address] Phone: [phone]   Fax:  [phone]  Physical Therapy Treatment  Patient Details  Name: Philip Stone MRN: [id number] Date of Birth: 1951-01-30 Referring Provider (PT): Sydnee Cabal MD   Encounter Date: 03/10/2018  PT End of Session - 03/10/18 1603    Visit Number  6    Number of Visits  12    Date for PT Re-Evaluation  03/21/18    Authorization Type  FOTO AT LEAST EVERY 5TH VISIT, 10TH VISIT PROGRESS NOTE AND KX MODIFIER AFTER THE 15 VISIT.    PT Start Time  0147    PT Stop Time  0249    PT Time Calculation (min)  62 min    Activity Tolerance  Patient tolerated treatment well    Behavior During Therapy  WFL for tasks assessed/performed       Past Medical History:  Diagnosis Date  . Arthritis   . BPH (benign prostatic hyperplasia)   . Charcot-Marie-Tooth disease   . Foot drop, right   . Neuropathy   . Platelets decreased ([place])   . Spinal stenosis     Past Surgical History:  Procedure Laterality Date  . CERVICAL DISC SURGERY  10 YEARS AGO   reports there are plates and screws in place   . FOOT ARTHROPLASTY    . INGUINAL HERNIA REPAIR Right 06/15/2014   Procedure: RIGHT INGUINAL HERNIORRHAPHY  WITH MESH;  Surgeon: Aviva Signs Md, MD;  Location: [facility];  Service: General;  Laterality: Right;  . INSERTION OF MESH Right 06/15/2014   Procedure: INSERTION OF MESH RIGHT INGUINAL HERNIORRHAPHY;  Surgeon: Aviva Signs Md, MD;  Location: [facility];  Service: General;  Laterality: Right;  . KNEE ARTHROSCOPY Right   . TOTAL KNEE ARTHROPLASTY Right 01/21/2018   Procedure: TOTAL KNEE ARTHROPLASTY;  Surgeon: Sydnee Cabal, MD;  [facility]: [facility];  Service: Orthopedics;  Laterality: Right;    There were no vitals filed for this visit.  Subjective Assessment - 03/10/18 1604    Subjective  Doctor cleaned off my scabbed and bandaged.  he didn't think i had an infection but called me in  an antibiotic just in case.    Pertinent History  Pt reports doing good after last Rx and continues to work on ROM    Patient Stated Goals  Get knee better.    Currently in Pain?  Yes    Pain Score  3     Pain Orientation  Right    Pain Descriptors / Indicators  Aching;Sore    Pain Type  Surgical pain    Pain Onset  More than a month ago                       [facility] Adult PT Treatment/Exercise - 03/10/18 0001      Exercises   Exercises  Knee/Hip      Knee/Hip Exercises: Aerobic   Recumbent Bike  15 minutes attempting to make revolutions.      Knee/Hip Exercises: Machines for Strengthening   Cybex Knee Extension  10# x 3 minutes.    Cybex Knee Flexion  30# x 3 minutes.    Cybex Leg Press  2 plates x 3 minutes.      Modalities   Modalities  Health visitor Stimulation Location  Right knee.    Financial controller  Stimulation Parameters  1-10 Hz x 20 minutes.    Electrical Stimulation Goals  Edema;Pain      Vasopneumatic   Number Minutes Vasopneumatic   20 minutes    Vasopnuematic Location   --   Right knee.   Vasopneumatic Pressure  Low      Manual Therapy   Manual Therapy  Passive ROM    Passive ROM  Passive right knee flexion and extension to patient tolerance x 3 minutes.               PT Short Term Goals - 02/21/18 1448      PT SHORT TERM GOAL #1   Title  Full right active knee extension in order to normalize gait.    Time  4    Period  Weeks    Status  New        PT Long Term Goals - 02/21/18 1449      PT LONG TERM GOAL #1   Title  Independent with a HEP.    Time  4    Period  Weeks    Status  New      PT LONG TERM GOAL #2   Title  Active right knee flexion to 115 degrees+ so the patient can perform functional tasks and do so with pain not > 2-3/10.    Time  4    Period  Weeks    Status  New      PT LONG TERM GOAL #3   Title   Increase right knee strength to 5/5 to provide good stability for accomplishment of functional activities.    Time  4    Period  Weeks    Status  New      PT LONG TERM GOAL #4   Title  Decrease edema to within 2.5 cms of non-affected side to assist with pain reduction and range of motion gains.    Time  4    Period  Weeks    Status  New      PT LONG TERM GOAL #5   Title  Perform a reciprocating stair gait with one railing with pain not > 2-3/10.    Time  4    Period  Weeks    Status  New            Plan - 03/10/18 1614    Clinical Impression Statement  Patient did very well today with the addition of the leg press.  Right knee continues to lack flexion and extension.    PT Treatment/Interventions  ADLs/Self Care Home Management;Cryotherapy;Electrical Stimulation;Stair training;Functional mobility training;Gait training;Therapeutic activities;Therapeutic exercise;Patient/family education;Neuromuscular re-education;Manual techniques;Vasopneumatic Device;Passive range of motion    PT Next Visit Plan  VMS to right quadriceps, Nustep, PROM, vasopneumatic.  Progress into total knee protocol.   Send MD note today    Consulted and Agree with Plan of Care  Patient       Patient will benefit from skilled therapeutic intervention in order to improve the following deficits and impairments:  Abnormal gait, Decreased activity tolerance, Decreased range of motion, Decreased strength, Increased edema, Pain  Visit Diagnosis: Chronic pain of right knee  Stiffness of right knee, not elsewhere classified  Localized edema     Problem List Patient Active Problem List   Diagnosis Date Noted  . 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 Cervical stenosis of spinal canal 11/16/2017  . 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 Foot drop, right 07/15/2017  . Gastroesophageal reflux disease  with esophagitis 11/30/2016  . Benign prostatic hyperplasia with elevated prostate specific antigen (PSA)  11/30/2016    Kaniah Rizzolo, [place]  MPT 03/10/2018, 4:16 PM  [facility] [address] [place], [place], [address] Phone: [phone]   Fax:  [phone]  Name: Ponciano Shealy MRN: [id number] Date of Birth: 10/24/1950

## 2018-03-15 ENCOUNTER — Ambulatory Visit: Payer: PPO | Attending: Specialist | Admitting: *Deleted

## 2018-03-15 DIAGNOSIS — M25561 Pain in right knee: Secondary | ICD-10-CM | POA: Insufficient documentation

## 2018-03-15 DIAGNOSIS — R600 Localized edema: Secondary | ICD-10-CM | POA: Diagnosis not present

## 2018-03-15 DIAGNOSIS — M25661 Stiffness of right knee, not elsewhere classified: Secondary | ICD-10-CM | POA: Insufficient documentation

## 2018-03-15 DIAGNOSIS — G8929 Other chronic pain: Secondary | ICD-10-CM | POA: Diagnosis not present

## 2018-03-15 NOTE — Therapy (Signed)
[facility] [place], [place], [address] Phone: [phone]   Fax:  [phone]  Physical Therapy Treatment  Patient Details  Name: Philip Stone MRN: [id number] Date of Birth: April 23, 1950 Referring Provider (PT): Sydnee Cabal MD   Encounter Date: 03/15/2018  PT End of Session - 03/15/18 1502    Visit Number  7    Number of Visits  12    Date for PT Re-Evaluation  03/21/18    Authorization Type  FOTO AT LEAST EVERY 5TH VISIT, 10TH VISIT PROGRESS NOTE AND KX MODIFIER AFTER THE 15 VISIT.    PT Start Time  1430    PT Stop Time  1530    PT Time Calculation (min)  60 min    Activity Tolerance  Patient tolerated treatment well    Behavior During Therapy  WFL for tasks assessed/performed       Past Medical History:  Diagnosis Date  . Arthritis   . BPH (benign prostatic hyperplasia)   . Charcot-Marie-Tooth disease   . Foot drop, right   . Neuropathy   . Platelets decreased ([place])   . Spinal stenosis     Past Surgical History:  Procedure Laterality Date  . CERVICAL DISC SURGERY  10 YEARS AGO   reports there are plates and screws in place   . FOOT ARTHROPLASTY    . INGUINAL HERNIA REPAIR Right 06/15/2014   Procedure: RIGHT INGUINAL HERNIORRHAPHY  WITH MESH;  Surgeon: Aviva Signs Md, MD;  Location: [facility];  Service: General;  Laterality: Right;  . INSERTION OF MESH Right 06/15/2014   Procedure: INSERTION OF MESH RIGHT INGUINAL HERNIORRHAPHY;  Surgeon: Aviva Signs Md, MD;  Location: [facility];  Service: General;  Laterality: Right;  . KNEE ARTHROSCOPY Right   . TOTAL KNEE ARTHROPLASTY Right 01/21/2018   Procedure: TOTAL KNEE ARTHROPLASTY;  Surgeon: Sydnee Cabal, MD;  Location: [facility];  Service: Orthopedics;  Laterality: Right;    There were no vitals filed for this visit.  Subjective Assessment - 03/15/18 1452    Subjective  I'm still working my knee at the gym    Pertinent History  Pt reports doing good after last Rx and  continues to work on ROM    Patient Stated Goals  Get knee better.    Currently in Pain?  Yes    Pain Score  3     Pain Location  Knee    Pain Orientation  Right    Pain Descriptors / Indicators  Aching;Sore    Pain Type  Surgical pain    Pain Onset  More than a month ago                       [facility] Adult PT Treatment/Exercise - 03/15/18 0001      Exercises   Exercises  Knee/Hip      Knee/Hip Exercises: Aerobic   Recumbent Bike  x 5 mins    Nustep  Level 4 x 15 minutes moving forward as tolerated to increase right knee flexion.      Knee/Hip Exercises: Machines for Strengthening   Cybex Knee Extension  10# x 3 minutes.   with Flexion stretch as well   Cybex Knee Flexion  30# x 3 minutes.    Cybex Leg Press  seat 2 plates   seat 7    increase flexion for stretching     Modalities   Modalities  Dentist  Stimulation   Electrical Stimulation Location  Right knee. IFC x 15 mins 1-10 HZ    Electrical Stimulation Goals  Edema;Pain      Vasopneumatic   Number Minutes Vasopneumatic   15 minutes    Vasopnuematic Location   --   Right knee.   Vasopneumatic Pressure  Low    Vasopneumatic Temperature   36      Manual Therapy   Manual Therapy  Passive ROM    Passive ROM  Passive right knee flexion and extension to patient tolerance                PT Short Term Goals - 02/21/18 1448      PT SHORT TERM GOAL #1   Title  Full right active knee extension in order to normalize gait.    Time  4    Period  Weeks    Status  New        PT Long Term Goals - 02/21/18 1449      PT LONG TERM GOAL #1   Title  Independent with a HEP.    Time  4    Period  Weeks    Status  New      PT LONG TERM GOAL #2   Title  Active right knee flexion to 115 degrees+ so the patient can perform functional tasks and do so with pain not > 2-3/10.    Time  4    Period  Weeks    Status  New      PT LONG TERM GOAL #3   Title   Increase right knee strength to 5/5 to provide good stability for accomplishment of functional activities.    Time  4    Period  Weeks    Status  New      PT LONG TERM GOAL #4   Title  Decrease edema to within 2.5 cms of non-affected side to assist with pain reduction and range of motion gains.    Time  4    Period  Weeks    Status  New      PT LONG TERM GOAL #5   Title  Perform a reciprocating stair gait with one railing with pain not > 2-3/10.    Time  4    Period  Weeks    Status  New            Plan - 03/15/18 1541    Clinical Impression Statement  Pt arrived today doing fairly well with RT knee. His CC was the swelling still in RT knee. He was able to complete all therex today without complaints, but continues to have flexion and extension deficits. Normal modality response to modalities    Clinical Presentation  Stable    Clinical Decision Making  Low    Rehab Potential  Good    PT Frequency  2x / week    PT Duration  4 weeks    PT Treatment/Interventions  ADLs/Self Care Home Management;Cryotherapy;Electrical Stimulation;Stair training;Functional mobility training;Gait training;Therapeutic activities;Therapeutic exercise;Patient/family education;Neuromuscular re-education;Manual techniques;Vasopneumatic Device;Passive range of motion    PT Next Visit Plan  VMS to right quadriceps, Nustep, PROM, vasopneumatic.  Progress into total knee protocol.   Send MD note today    Consulted and Agree with Plan of Care  Patient       Patient will benefit from skilled therapeutic intervention in order to improve the following deficits and impairments:  Abnormal gait, Decreased activity tolerance, Decreased range  of motion, Decreased strength, Increased edema, Pain  Visit Diagnosis: Chronic pain of right knee  Stiffness of right knee, not elsewhere classified  Localized edema     Problem List Patient Active Problem List   Diagnosis Date Noted  . S/P knee replacement  01/21/2018  . Cervical stenosis of spinal canal 11/16/2017  . 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 Foot drop, right 07/15/2017  . Gastroesophageal reflux disease with esophagitis 11/30/2016  . Benign prostatic hyperplasia with elevated prostate specific antigen (PSA) 11/30/2016    Philip Stone,CHRIS, PTA 03/15/2018, 3:52 PM  [facility] [place], [place], [address] Phone: [phone]   Fax:  [phone]  Name: Philip Stone MRN: [id number] Date of Birth: 1951/01/18

## 2018-03-17 ENCOUNTER — Ambulatory Visit: Payer: PPO | Admitting: Physical Therapy

## 2018-03-17 ENCOUNTER — Encounter: Payer: Self-pay | Admitting: Physical Therapy

## 2018-03-17 DIAGNOSIS — M25561 Pain in right knee: Principal | ICD-10-CM

## 2018-03-17 DIAGNOSIS — R600 Localized edema: Secondary | ICD-10-CM

## 2018-03-17 DIAGNOSIS — M25661 Stiffness of right knee, not elsewhere classified: Secondary | ICD-10-CM

## 2018-03-17 DIAGNOSIS — G8929 Other chronic pain: Secondary | ICD-10-CM

## 2018-03-17 NOTE — Therapy (Signed)
[facility] [place], [place], [address] Phone: [phone]   Fax:  [phone]  Physical Therapy Treatment  Patient Details  Name: Philip Stone MRN: [id number] Date of Birth: [birth year] Referring Provider (PT): Sydnee Cabal MD   Encounter Date: 03/17/2018  PT End of Session - 03/17/18 1437    Visit Number  8    Number of Visits  12    Date for PT Re-Evaluation  03/21/18    Authorization Type  FOTO AT LEAST EVERY 5TH VISIT, 10TH VISIT PROGRESS NOTE AND KX MODIFIER AFTER THE 15 VISIT.    PT Start Time  0230    PT Stop Time  0322    PT Time Calculation (min)  52 min    Activity Tolerance  Patient tolerated treatment well    Behavior During Therapy  WFL for tasks assessed/performed       Past Medical History:  Diagnosis Date  . Arthritis   . BPH (benign prostatic hyperplasia)   . Charcot-Marie-Tooth disease   . Foot drop, right   . Neuropathy   . Platelets decreased ([place])   . Spinal stenosis     Past Surgical History:  Procedure Laterality Date  . CERVICAL DISC SURGERY  10 YEARS AGO   reports there are plates and screws in place   . FOOT ARTHROPLASTY    . INGUINAL HERNIA REPAIR Right 06/15/2014   Procedure: RIGHT INGUINAL HERNIORRHAPHY  WITH MESH;  Surgeon: Aviva Signs Md, MD;  Location: [facility];  Service: General;  Laterality: Right;  . INSERTION OF MESH Right 06/15/2014   Procedure: INSERTION OF MESH RIGHT INGUINAL HERNIORRHAPHY;  Surgeon: Aviva Signs Md, MD;  Location: [facility];  Service: General;  Laterality: Right;  . KNEE ARTHROSCOPY Right   . TOTAL KNEE ARTHROPLASTY Right 01/21/2018   Procedure: TOTAL KNEE ARTHROPLASTY;  Surgeon: Sydnee Cabal, MD;  Location: [facility];  Service: Orthopedics;  Laterality: Right;    There were no vitals filed for this visit.  Subjective Assessment - 03/17/18 1437    Subjective  No new complaints.    Pertinent History  Pt reports doing good after last Rx and continues to work  on ROM    Patient Stated Goals  Get knee better.    Currently in Pain?  Yes    Pain Score  3     Pain Location  Knee    Pain Orientation  Right    Pain Descriptors / Indicators  Aching;Sore    Pain Type  Surgical pain    Pain Onset  More than a month ago         [facility] PT Assessment - 03/17/18 0001      PROM   Overall PROM Comments  -10 to 108 degrees.                   [facility] Adult PT Treatment/Exercise - 03/17/18 0001      Exercises   Exercises  Knee/Hip      Knee/Hip Exercises: Machines for Strengthening   Cybex Knee Extension  10# x 3 minutes.    Cybex Knee Flexion  30# x 3 minutes.    Cybex Leg Press  2 plates x 3 minutes.      Modalities   Modalities  Health visitor Stimulation Location  Right knee.    Electrical Stimulation Action  IFC    Electrical Stimulation Parameters  1-10 Hz x 20  minutes.    Electrical Stimulation Goals  Edema;Pain      Vasopneumatic   Number Minutes Vasopneumatic   20 minutes    Vasopnuematic Location   --   Right knee.   Vasopneumatic Pressure  Medium               PT Short Term Goals - 02/21/18 1448      PT SHORT TERM GOAL #1   Title  Full right active knee extension in order to normalize gait.    Time  4    Period  Weeks    Status  New        PT Long Term Goals - 02/21/18 1449      PT LONG TERM GOAL #1   Title  Independent with a HEP.    Time  4    Period  Weeks    Status  New      PT LONG TERM GOAL #2   Title  Active right knee flexion to 115 degrees+ so the patient can perform functional tasks and do so with pain not > 2-3/10.    Time  4    Period  Weeks    Status  New      PT LONG TERM GOAL #3   Title  Increase right knee strength to 5/5 to provide good stability for accomplishment of functional activities.    Time  4    Period  Weeks    Status  New      PT LONG TERM GOAL #4   Title  Decrease edema to within 2.5 cms of  non-affected side to assist with pain reduction and range of motion gains.    Time  4    Period  Weeks    Status  New      PT LONG TERM GOAL #5   Title  Perform a reciprocating stair gait with one railing with pain not > 2-3/10.    Time  4    Period  Weeks    Status  New            Plan - 03/17/18 1504    Clinical Impression Statement  Passive right knee flexion to 108 degrees.    PT Treatment/Interventions  ADLs/Self Care Home Management;Cryotherapy;Electrical Stimulation;Stair training;Functional mobility training;Gait training;Therapeutic activities;Therapeutic exercise;Patient/family education;Neuromuscular re-education;Manual techniques;Vasopneumatic Device;Passive range of motion    PT Next Visit Plan  VMS to right quadriceps, Nustep, PROM, vasopneumatic.  Progress into total knee protocol.   Send MD note today    Consulted and Agree with Plan of Care  Patient       Patient will benefit from skilled therapeutic intervention in order to improve the following deficits and impairments:  Abnormal gait, Decreased activity tolerance, Decreased range of motion, Decreased strength, Increased edema, Pain  Visit Diagnosis: Chronic pain of right knee  Stiffness of right knee, not elsewhere classified  Localized edema     Problem List Patient Active Problem List   Diagnosis Date Noted  . 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 Cervical stenosis of spinal canal 11/16/2017  . 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 Foot drop, right 07/15/2017  . Gastroesophageal reflux disease with esophagitis 11/30/2016  . Benign prostatic hyperplasia with elevated prostate specific antigen (PSA) 11/30/2016    Gomer France, [place] MPT 03/17/2018, 3:26 PM  [facility] [place], [place], [address] Phone: [phone]   Fax:  [phone]  Name: Philip Stone MRN: [id number] Date of Birth:  05/18/1950   

## 2018-03-21 ENCOUNTER — Other Ambulatory Visit: Payer: Self-pay | Admitting: Family Medicine

## 2018-03-22 ENCOUNTER — Encounter: Payer: Self-pay | Admitting: Physical Therapy

## 2018-03-22 ENCOUNTER — Ambulatory Visit: Payer: PPO | Admitting: Physical Therapy

## 2018-03-22 DIAGNOSIS — G8929 Other chronic pain: Secondary | ICD-10-CM

## 2018-03-22 DIAGNOSIS — M25661 Stiffness of right knee, not elsewhere classified: Secondary | ICD-10-CM

## 2018-03-22 DIAGNOSIS — R600 Localized edema: Secondary | ICD-10-CM

## 2018-03-22 DIAGNOSIS — M25561 Pain in right knee: Secondary | ICD-10-CM | POA: Diagnosis not present

## 2018-03-22 NOTE — Therapy (Signed)
[facility] [place], [place], [address] Phone: [phone]   Fax:  [phone]  Physical Therapy Treatment  Patient Details  Name: Philip Stone MRN: [id number] Date of Birth: Sep 04, 1950 Referring Provider (PT): Sydnee Cabal MD   Encounter Date: 03/22/2018  PT End of Session - 03/22/18 1429    Visit Number  9    Number of Visits  12    Date for PT Re-Evaluation  03/21/18    Authorization Type  FOTO AT LEAST EVERY 5TH VISIT, 10TH VISIT PROGRESS NOTE AND KX MODIFIER AFTER THE 15 VISIT.    PT Start Time  1343    PT Stop Time  1440    PT Time Calculation (min)  57 min    Activity Tolerance  Patient tolerated treatment well    Behavior During Therapy  WFL for tasks assessed/performed       Past Medical History:  Diagnosis Date  . Arthritis   . BPH (benign prostatic hyperplasia)   . Charcot-Marie-Tooth disease   . Foot drop, right   . Neuropathy   . Platelets decreased ([place])   . Spinal stenosis     Past Surgical History:  Procedure Laterality Date  . CERVICAL DISC SURGERY  10 YEARS AGO   reports there are plates and screws in place   . FOOT ARTHROPLASTY    . INGUINAL HERNIA REPAIR Right 06/15/2014   Procedure: RIGHT INGUINAL HERNIORRHAPHY  WITH MESH;  Surgeon: Aviva Signs Md, MD;  Location: [facility];  Service: General;  Laterality: Right;  . INSERTION OF MESH Right 06/15/2014   Procedure: INSERTION OF MESH RIGHT INGUINAL HERNIORRHAPHY;  Surgeon: Aviva Signs Md, MD;  Location: [facility];  Service: General;  Laterality: Right;  . KNEE ARTHROSCOPY Right   . TOTAL KNEE ARTHROPLASTY Right 01/21/2018   Procedure: TOTAL KNEE ARTHROPLASTY;  Surgeon: Sydnee Cabal, MD;  Location: [facility];  Service: Orthopedics;  Laterality: Right;    There were no vitals filed for this visit.  Subjective Assessment - 03/22/18 1428    Subjective  Patient reported feeling good with no new complaints. Patient compliant with HEP and [facility]     Pertinent History  Pt reports doing good after last Rx and continues to work on ROM    Patient Stated Goals  Get knee better.    Currently in Pain?  Yes    Pain Score  2     Pain Orientation  Right    Pain Descriptors / Indicators  Aching;Sore    Pain Type  Surgical pain    Pain Onset  More than a month ago    Pain Frequency  Constant         [facility] PT Assessment - 03/22/18 0001      Assessment   Medical Diagnosis  Right total knee replacement.                   [facility] Adult PT Treatment/Exercise - 03/22/18 0001      Exercises   Exercises  Knee/Hip      Knee/Hip Exercises: Aerobic   Nustep  Level 4 x 15 minutes moving forward as tolerated to increase right knee flexion.      Knee/Hip Exercises: Machines for Strengthening   Cybex Knee Extension  10# x 3 minutes.    Cybex Knee Flexion  30# x 3 minutes.    Cybex Leg Press  2 plates x 3 minutes.  Knee/Hip Exercises: Supine   Heel Prop for Knee Extension  3 minutes    Heel Prop for Knee Extension Weight (lbs)  2    Straight Leg Raise with External Rotation  AROM;Right;2 sets;10 reps      Modalities   Modalities  Health visitor Stimulation Location  Right knee.    Electrical Stimulation Action  IFC    Electrical Stimulation Parameters  1-20 hz x15 mins    Electrical Stimulation Goals  Edema;Pain      Vasopneumatic   Number Minutes Vasopneumatic   15 minutes    Vasopnuematic Location   Knee    Vasopneumatic Pressure  Low               PT Short Term Goals - 02/21/18 1448      PT SHORT TERM GOAL #1   Title  Full right active knee extension in order to normalize gait.    Time  4    Period  Weeks    Status  New        PT Long Term Goals - 02/21/18 1449      PT LONG TERM GOAL #1   Title  Independent with a HEP.    Time  4    Period  Weeks    Status  New      PT LONG TERM GOAL #2   Title  Active  right knee flexion to 115 degrees+ so the patient can perform functional tasks and do so with pain not > 2-3/10.    Time  4    Period  Weeks    Status  New      PT LONG TERM GOAL #3   Title  Increase right knee strength to 5/5 to provide good stability for accomplishment of functional activities.    Time  4    Period  Weeks    Status  New      PT LONG TERM GOAL #4   Title  Decrease edema to within 2.5 cms of non-affected side to assist with pain reduction and range of motion gains.    Time  4    Period  Weeks    Status  New      PT LONG TERM GOAL #5   Title  Perform a reciprocating stair gait with one railing with pain not > 2-3/10.    Time  4    Period  Weeks    Status  New            Plan - 03/22/18 1429    Clinical Impression Statement  Patient was able to tolerate treatment well and demonstrated good form and technique with all exercises. Patient instructing on heel propping to improve right knee extension. Patient reported understanding. Normal response to modalities upon removal.     Clinical Presentation  Stable    Clinical Decision Making  Low    Rehab Potential  Good    PT Frequency  2x / week    PT Duration  4 weeks    PT Treatment/Interventions  ADLs/Self Care Home Management;Cryotherapy;Electrical Stimulation;Stair training;Functional mobility training;Gait training;Therapeutic activities;Therapeutic exercise;Patient/family education;Neuromuscular re-education;Manual techniques;Vasopneumatic Device;Passive range of motion    PT Next Visit Plan  VMS to right quadriceps, Nustep, PROM, vasopneumatic.  Progress into total knee protocol.      Consulted and Agree with Plan of Care  Patient       Patient will benefit from skilled  therapeutic intervention in order to improve the following deficits and impairments:  Abnormal gait, Decreased activity tolerance, Decreased range of motion, Decreased strength, Increased edema, Pain  Visit Diagnosis: Chronic pain of right  knee  Stiffness of right knee, not elsewhere classified  Localized edema     Problem List Patient Active Problem List   Diagnosis Date Noted  . 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 Cervical stenosis of spinal canal 11/16/2017  . 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 Foot drop, right 07/15/2017  . Gastroesophageal reflux disease with esophagitis 11/30/2016  . Benign prostatic hyperplasia with elevated prostate specific antigen (PSA) 11/30/2016    Gabriela Eves, PT, DPT 03/22/2018, 3:51 PM  [facility] [address] [place], [place], [address] Phone: [phone]   Fax:  [phone]  Name: Philip Stone MRN: [id number] Date of Birth: [birth year]

## 2018-03-22 NOTE — Telephone Encounter (Signed)
Next OV 04/20/18

## 2018-03-24 ENCOUNTER — Ambulatory Visit: Payer: PPO | Admitting: *Deleted

## 2018-03-24 DIAGNOSIS — G8929 Other chronic pain: Secondary | ICD-10-CM

## 2018-03-24 DIAGNOSIS — M25561 Pain in right knee: Principal | ICD-10-CM

## 2018-03-24 DIAGNOSIS — R600 Localized edema: Secondary | ICD-10-CM

## 2018-03-24 DIAGNOSIS — M25661 Stiffness of right knee, not elsewhere classified: Secondary | ICD-10-CM

## 2018-03-24 NOTE — Therapy (Signed)
[facility] [place], [place], [address] Phone: [phone]   Fax:  [phone]  Physical Therapy Treatment  Patient Details  Name: Philip Stone MRN: [id number] Date of Birth: 29-Jan-1951 Referring Provider (PT): Sydnee Cabal MD   Encounter Date: 03/24/2018  PT End of Session - 03/24/18 1404    Visit Number  10    Number of Visits  18    Date for PT Re-Evaluation  04/22/18    Authorization Type  FOTO AT LEAST EVERY 5TH VISIT, 10TH VISIT PROGRESS NOTE AND KX MODIFIER AFTER THE 15 VISIT.    PT Start Time  1345    PT Stop Time  1444    PT Time Calculation (min)  59 min       Past Medical History:  Diagnosis Date  . Arthritis   . BPH (benign prostatic hyperplasia)   . Charcot-Marie-Tooth disease   . Foot drop, right   . Neuropathy   . Platelets decreased ([place])   . Spinal stenosis     Past Surgical History:  Procedure Laterality Date  . CERVICAL DISC SURGERY  10 YEARS AGO   reports there are plates and screws in place   . FOOT ARTHROPLASTY    . INGUINAL HERNIA REPAIR Right 06/15/2014   Procedure: RIGHT INGUINAL HERNIORRHAPHY  WITH MESH;  Surgeon: Aviva Signs Md, MD;  Location: [facility];  Service: General;  Laterality: Right;  . INSERTION OF MESH Right 06/15/2014   Procedure: INSERTION OF MESH RIGHT INGUINAL HERNIORRHAPHY;  Surgeon: Aviva Signs Md, MD;  Location: [facility];  Service: General;  Laterality: Right;  . KNEE ARTHROSCOPY Right   . TOTAL KNEE ARTHROPLASTY Right 01/21/2018   Procedure: TOTAL KNEE ARTHROPLASTY;  Surgeon: Sydnee Cabal, MD;  Location: [facility];  Service: Orthopedics;  Laterality: Right;    There were no vitals filed for this visit.  Subjective Assessment - 03/24/18 1403    Subjective  RT knee pain 3/10 today. Went to MD yesterday and he said to cont PT    Pertinent History  Pt reports doing good after last Rx and continues to work on ROM    Patient Stated Goals  Get knee better.    Currently in  Pain?  Yes    Pain Score  3     Pain Location  Knee    Pain Orientation  Right    Pain Descriptors / Indicators  Aching;Sore    Pain Onset  More than a month ago    Pain Frequency  Intermittent                       [facility] Adult PT Treatment/Exercise - 03/24/18 0001      Exercises   Exercises  Knee/Hip      Knee/Hip Exercises: Aerobic   Nustep  Level 4 x 15 minutes moving forward as tolerated to increase right knee flexion.      Knee/Hip Exercises: Machines for Strengthening   Cybex Knee Extension  10# x 3 minutes.    Cybex Knee Flexion  30# x 3 minutes.    Cybex Leg Press  2 1/2 plates x 4 minutes.seat 6      Knee/Hip Exercises: Supine   Heel Prop for Knee Extension  5 minutes    Heel Prop for Knee Extension Weight (lbs)  4      Modalities   Modalities  Electrical Stimulation;Vasopneumatic      Theme park manager  Right knee.    Electrical Stimulation Action  IFC x 15 mins 1-10 hz    Electrical Stimulation Goals  Edema;Pain      Vasopneumatic   Number Minutes Vasopneumatic   15 minutes    Vasopnuematic Location   Knee    Vasopneumatic Pressure  Low    Vasopneumatic Temperature   36      Manual Therapy   Manual Therapy  Passive ROM    Passive ROM  Passive right knee flexion and extension to patient tolerance 8-110 PROM today               PT Short Term Goals - 02/21/18 1448      PT SHORT TERM GOAL #1   Title  Full right active knee extension in order to normalize gait.    Time  4    Period  Weeks    Status  New        PT Long Term Goals - 02/21/18 1449      PT LONG TERM GOAL #1   Title  Independent with a HEP.    Time  4    Period  Weeks    Status  New      PT LONG TERM GOAL #2   Title  Active right knee flexion to 115 degrees+ so the patient can perform functional tasks and do so with pain not > 2-3/10.    Time  4    Period  Weeks    Status  New      PT LONG TERM GOAL #3   Title   Increase right knee strength to 5/5 to provide good stability for accomplishment of functional activities.    Time  4    Period  Weeks    Status  New      PT LONG TERM GOAL #4   Title  Decrease edema to within 2.5 cms of non-affected side to assist with pain reduction and range of motion gains.    Time  4    Period  Weeks    Status  New      PT LONG TERM GOAL #5   Title  Perform a reciprocating stair gait with one railing with pain not > 2-3/10.    Time  4    Period  Weeks    Status  New            Plan - 03/24/18 1414    Clinical Impression Statement  Pt arrived today doing fairly well and reports F/U with MD yesterday and was pleased.Rx focused on strengthening and stretching for RT Knee. He was able to reach 8-110 degrees today. Normal modality response today    History and Personal Factors relevant to plan of care:  10th visit FOTO 49%. Pt arrived today after F/U with MD and reports MD was pleased and wants him to cont. with PT.  He was able to perform therex today with focus on strength and stretching  for RT knee    Rehab Potential  Good    PT Frequency  2x / week    PT Duration  4 weeks    PT Treatment/Interventions  ADLs/Self Care Home Management;Cryotherapy;Electrical Stimulation;Stair training;Functional mobility training;Gait training;Therapeutic activities;Therapeutic exercise;Patient/family education;Neuromuscular re-education;Manual techniques;Vasopneumatic Device;Passive range of motion    PT Next Visit Plan  Recert to cont       Patient will benefit from skilled therapeutic intervention in order to improve the following deficits and impairments:  Abnormal gait, Decreased  activity tolerance, Decreased range of motion, Decreased strength, Increased edema, Pain  Visit Diagnosis: Chronic pain of right knee  Stiffness of right knee, not elsewhere classified  Localized edema     Problem List Patient Active Problem List   Diagnosis Date Noted  . S/P knee  replacement 01/21/2018  . Cervical stenosis of spinal canal 11/16/2017  . 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 Foot drop, right 07/15/2017  . Gastroesophageal reflux disease with esophagitis 11/30/2016  . Benign prostatic hyperplasia with elevated prostate specific antigen (PSA) 11/30/2016    RAMSEUR, CHRIS, PTA 03/24/2018, 6:10 PM  [facility] [place], [place], [address] Phone: [phone]   Fax:  [phone]  Name: Philip Stone MRN: [id number] Date of Birth: [birth year]

## 2018-03-29 ENCOUNTER — Ambulatory Visit: Payer: PPO | Admitting: *Deleted

## 2018-03-29 DIAGNOSIS — R600 Localized edema: Secondary | ICD-10-CM

## 2018-03-29 DIAGNOSIS — M25561 Pain in right knee: Secondary | ICD-10-CM | POA: Diagnosis not present

## 2018-03-29 DIAGNOSIS — M25661 Stiffness of right knee, not elsewhere classified: Secondary | ICD-10-CM

## 2018-03-29 DIAGNOSIS — G8929 Other chronic pain: Secondary | ICD-10-CM

## 2018-03-29 NOTE — Therapy (Signed)
[facility] [place], [place], [address] Phone: [phone]   Fax:  [phone]  Physical Therapy Treatment  Patient Details  Name: Philip Stone MRN: [id number] Date of Birth: [birth year] Referring Provider (PT): Sydnee Cabal MD   Encounter Date: 03/29/2018  PT End of Session - 03/29/18 1356    Visit Number  11    Number of Visits  18    Authorization Type  FOTO AT LEAST EVERY 5TH VISIT, 10TH VISIT PROGRESS NOTE AND KX MODIFIER AFTER THE 15 VISIT.    PT Start Time  1344    PT Stop Time  1440    PT Time Calculation (min)  56 min       Past Medical History:  Diagnosis Date  . Arthritis   . BPH (benign prostatic hyperplasia)   . Charcot-Marie-Tooth disease   . Foot drop, right   . Neuropathy   . Platelets decreased ([place])   . Spinal stenosis     Past Surgical History:  Procedure Laterality Date  . CERVICAL DISC SURGERY  10 YEARS AGO   reports there are plates and screws in place   . FOOT ARTHROPLASTY    . INGUINAL HERNIA REPAIR Right 06/15/2014   Procedure: RIGHT INGUINAL HERNIORRHAPHY  WITH MESH;  Surgeon: Aviva Signs Md, MD;  Location: [facility];  Service: General;  Laterality: Right;  . INSERTION OF MESH Right 06/15/2014   Procedure: INSERTION OF MESH RIGHT INGUINAL HERNIORRHAPHY;  Surgeon: Aviva Signs Md, MD;  Location: [facility];  Service: General;  Laterality: Right;  . KNEE ARTHROSCOPY Right   . TOTAL KNEE ARTHROPLASTY Right 01/21/2018   Procedure: TOTAL KNEE ARTHROPLASTY;  Surgeon: Sydnee Cabal, MD;  [facility]: [facility];  Service: Orthopedics;  Laterality: Right;    There were no vitals filed for this visit.  Subjective Assessment - 03/29/18 1355    Subjective  RT knee pain 3/10 today. Went to MD  and he said to cont PT    Pertinent History  Pt reports doing good after last Rx and continues to work on ROM    Patient Stated Goals  Get knee better.    Currently in Pain?  Yes    Pain Score  3     Pain Location   Knee    Pain Orientation  Right    Pain Type  Surgical pain    Pain Onset  More than a month ago                       [facility] Adult PT Treatment/Exercise - 03/29/18 0001      Exercises   Exercises  Knee/Hip      Knee/Hip Exercises: Aerobic   Nustep  Level 4 x 15 minutes moving forward as tolerated to increase right knee flexion.      Knee/Hip Exercises: Machines for Strengthening   Cybex Knee Extension  10# x 3 minutes.    Cybex Knee Flexion  30# x 3 minutes.    Cybex Leg Press  2 1/2 plates x 4 minutes.seat 6      Knee/Hip Exercises: Supine   Heel Prop for Knee Extension  4 minutes    Heel Prop for Knee Extension Weight (lbs)  5      Modalities   Modalities  Electrical Stimulation;Vasopneumatic      Electrical Stimulation   Electrical Stimulation Location  Right knee.    Electrical Stimulation Goals  Edema;Pain      Vasopneumatic  Number Minutes Vasopneumatic   15 minutes    Vasopnuematic Location   Knee    Vasopneumatic Pressure  Low    Vasopneumatic Temperature   36      Manual Therapy   Manual Therapy  Passive ROM    Passive ROM  Passive right knee flexion and extension to patient tolerance 8-110 PROM today               PT Short Term Goals - 02/21/18 1448      PT SHORT TERM GOAL #1   Title  Full right active knee extension in order to normalize gait.    Time  4    Period  Weeks    Status  New        PT Long Term Goals - 03/29/18 1644      PT LONG TERM GOAL #1   Title  Independent with a HEP.    Time  4    Period  Weeks    Status  Achieved      PT LONG TERM GOAL #2   Title  Active right knee flexion to 115 degrees+ so the patient can perform functional tasks and do so with pain not > 2-3/10.    Time  4    Period  Weeks    Status  Partially Met      PT LONG TERM GOAL #3   Title  Increase right knee strength to 5/5 to provide good stability for accomplishment of functional activities.    Time  4    Period  Weeks     Status  On-going      PT LONG TERM GOAL #4   Title  Decrease edema to within 2.5 cms of non-affected side to assist with pain reduction and range of motion gains.    Time  4    Period  Weeks    Status  Partially Met      PT LONG TERM GOAL #5   Title  Perform a reciprocating stair gait with one railing with pain not > 2-3/10.    Time  4    Period  Weeks    Status  On-going            Plan - 03/29/18 1632    Clinical Impression Statement  Pt arrived doing fairly well and reports working on extension ROM at home with 2.5 # wt and ice pack x 10 mins for LLLDS. He did well with Therex today for strengthening and ROM progression. ROM 8-110 degrees today. Normal modality response today.    Clinical Presentation  Stable    Clinical Decision Making  Low    Rehab Potential  Good    PT Frequency  2x / week    PT Duration  4 weeks    PT Treatment/Interventions  ADLs/Self Care Home Management;Cryotherapy;Electrical Stimulation;Stair training;Functional mobility training;Gait training;Therapeutic activities;Therapeutic exercise;Patient/family education;Neuromuscular re-education;Manual techniques;Vasopneumatic Device;Passive range of motion    PT Next Visit Plan  Cont with strengthening and ROM progression    Consulted and Agree with Plan of Care  Patient       Patient will benefit from skilled therapeutic intervention in order to improve the following deficits and impairments:  Abnormal gait, Decreased activity tolerance, Decreased range of motion, Decreased strength, Increased edema, Pain  Visit Diagnosis: Chronic pain of right knee  Stiffness of right knee, not elsewhere classified  Localized edema     Problem List Patient Active Problem List   Diagnosis Date  Noted  . 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 Cervical stenosis of spinal canal 11/16/2017  . 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 Foot drop, right 07/15/2017  . Gastroesophageal reflux disease with esophagitis  11/30/2016  . Benign prostatic hyperplasia with elevated prostate specific antigen (PSA) 11/30/2016    Mavery Milling,CHRIS, PTA 03/29/2018, 4:45 PM  [facility] [place], [place], [address] Phone: [phone]   Fax:  [phone]  Name: Philip Stone MRN: [id number] Date of Birth: [birth year]

## 2018-03-31 ENCOUNTER — Ambulatory Visit: Payer: PPO | Admitting: Physical Therapy

## 2018-03-31 DIAGNOSIS — M25661 Stiffness of right knee, not elsewhere classified: Secondary | ICD-10-CM

## 2018-03-31 DIAGNOSIS — M25561 Pain in right knee: Secondary | ICD-10-CM | POA: Diagnosis not present

## 2018-03-31 DIAGNOSIS — R600 Localized edema: Secondary | ICD-10-CM

## 2018-03-31 DIAGNOSIS — G8929 Other chronic pain: Secondary | ICD-10-CM

## 2018-03-31 NOTE — Therapy (Signed)
[facility] [place], [place], [address] Phone: [phone]   Fax:  [phone]  Physical Therapy Treatment  Patient Details  Name: Philip Stone MRN: [id number] Date of Birth: 1950-05-31 Referring Provider (PT): Sydnee Cabal MD   Encounter Date: 03/31/2018  PT End of Session - 03/31/18 1348    Visit Number  12    Number of Visits  18    Date for PT Re-Evaluation  04/22/18    Authorization Type  FOTO AT LEAST EVERY 5TH VISIT, 10TH VISIT PROGRESS NOTE AND KX MODIFIER AFTER THE 15 VISIT.    PT Start Time  1346    PT Stop Time  1440    PT Time Calculation (min)  54 min       Past Medical History:  Diagnosis Date  . Arthritis   . BPH (benign prostatic hyperplasia)   . Charcot-Marie-Tooth disease   . Foot drop, right   . Neuropathy   . Platelets decreased ([place])   . Spinal stenosis     Past Surgical History:  Procedure Laterality Date  . CERVICAL DISC SURGERY  10 YEARS AGO   reports there are plates and screws in place   . FOOT ARTHROPLASTY    . INGUINAL HERNIA REPAIR Right 06/15/2014   Procedure: RIGHT INGUINAL HERNIORRHAPHY  WITH MESH;  Surgeon: Aviva Signs Md, MD;  Location: [facility];  Service: General;  Laterality: Right;  . INSERTION OF MESH Right 06/15/2014   Procedure: INSERTION OF MESH RIGHT INGUINAL HERNIORRHAPHY;  Surgeon: Aviva Signs Md, MD;  Location: [facility];  Service: General;  Laterality: Right;  . KNEE ARTHROSCOPY Right   . TOTAL KNEE ARTHROPLASTY Right 01/21/2018   Procedure: TOTAL KNEE ARTHROPLASTY;  Surgeon: Sydnee Cabal, MD;  Location: [facility];  Service: Orthopedics;  Laterality: Right;    There were no vitals filed for this visit.  Subjective Assessment - 03/31/18 1349    Subjective  Swelling still a problem with ambulation.    Patient Stated Goals  Get knee better.    Currently in Pain?  Yes    Pain Score  3     Pain Location  Knee    Pain Orientation  Right    Pain Descriptors /  Indicators  Aching;Sore    Pain Type  Surgical pain                       [facility] Adult PT Treatment/Exercise - 03/31/18 0001      Knee/Hip Exercises: Stretches   Passive Hamstring Stretch  Right;2 reps;60 seconds    Other Knee/Hip Stretches  extension stretch with PT assist with gait belt in sitting      Knee/Hip Exercises: Aerobic   Nustep  Level 5 x 15 minutes moving forward as tolerated to increase right knee flexion.      Knee/Hip Exercises: Machines for Strengthening   Cybex Knee Extension  10# x 3 minutes.    Cybex Knee Flexion  30# x 3 minutes.    Cybex Leg Press  2 1/2 plates x 4 minutes.seat 6      Modalities   Modalities  Electrical Stimulation;Vasopneumatic      Electrical Stimulation   Electrical Stimulation Location  Right knee.IFC 1-10 Hz x 15 min    Electrical Stimulation Goals  Edema;Pain      Vasopneumatic   Number Minutes Vasopneumatic   15 minutes    Vasopnuematic Location   Knee    Vasopneumatic Pressure  Low    Vasopneumatic Temperature   34               PT Short Term Goals - 02/21/18 1448      PT SHORT TERM GOAL #1   Title  Full right active knee extension in order to normalize gait.    Time  4    Period  Weeks    Status  New        PT Long Term Goals - 03/29/18 1644      PT LONG TERM GOAL #1   Title  Independent with a HEP.    Time  4    Period  Weeks    Status  Achieved      PT LONG TERM GOAL #2   Title  Active right knee flexion to 115 degrees+ so the patient can perform functional tasks and do so with pain not > 2-3/10.    Time  4    Period  Weeks    Status  Partially Met      PT LONG TERM GOAL #3   Title  Increase right knee strength to 5/5 to provide good stability for accomplishment of functional activities.    Time  4    Period  Weeks    Status  On-going      PT LONG TERM GOAL #4   Title  Decrease edema to within 2.5 cms of non-affected side to assist with pain reduction and range of motion gains.     Time  4    Period  Weeks    Status  Partially Met      PT LONG TERM GOAL #5   Title  Perform a reciprocating stair gait with one railing with pain not > 2-3/10.    Time  4    Period  Weeks    Status  On-going            Plan - 03/31/18 1431    Clinical Impression Statement  Patient tolerated TE well with no c/o increased pain. He could really feel prone knee extension stretch and was encouraged to do this at home. Normal response to modalities.    PT Treatment/Interventions  ADLs/Self Care Home Management;Cryotherapy;Electrical Stimulation;Stair training;Functional mobility training;Gait training;Therapeutic activities;Therapeutic exercise;Patient/family education;Neuromuscular re-education;Manual techniques;Vasopneumatic Device;Passive range of motion    PT Next Visit Plan  Cont with strengthening and ROM progression       Patient will benefit from skilled therapeutic intervention in order to improve the following deficits and impairments:  Abnormal gait, Decreased activity tolerance, Decreased range of motion, Decreased strength, Increased edema, Pain  Visit Diagnosis: Chronic pain of right knee  Stiffness of right knee, not elsewhere classified  Localized edema     Problem List Patient Active Problem List   Diagnosis Date Noted  . 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 Cervical stenosis of spinal canal 11/16/2017  . 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 Foot drop, right 07/15/2017  . Gastroesophageal reflux disease with esophagitis 11/30/2016  . Benign prostatic hyperplasia with elevated prostate specific antigen (PSA) 11/30/2016   Julie Riddles PT 03/31/2018, 2:32 PM   [facility] [address] [place], [place], [address] Phone: [phone]   Fax:  [phone]  Name: Philip Stone MRN: [id number] Date of Birth: 02/07/1951   

## 2018-04-05 ENCOUNTER — Ambulatory Visit: Payer: PPO | Admitting: Physical Therapy

## 2018-04-05 ENCOUNTER — Encounter: Payer: Self-pay | Admitting: Physical Therapy

## 2018-04-05 DIAGNOSIS — M25661 Stiffness of right knee, not elsewhere classified: Secondary | ICD-10-CM

## 2018-04-05 DIAGNOSIS — R600 Localized edema: Secondary | ICD-10-CM

## 2018-04-05 DIAGNOSIS — M25561 Pain in right knee: Principal | ICD-10-CM

## 2018-04-05 DIAGNOSIS — G8929 Other chronic pain: Secondary | ICD-10-CM

## 2018-04-05 NOTE — Therapy (Signed)
[facility] [place], [place], [address] Phone: [phone]   Fax:  [phone]  Physical Therapy Treatment  Patient Details  Name: Philip Stone MRN: [id number] Date of Birth: [birth year] Referring Provider (PT): Sydnee Cabal MD   Encounter Date: 04/05/2018  PT End of Session - 04/05/18 1348    Visit Number  13    Number of Visits  18    Date for PT Re-Evaluation  04/22/18    Authorization Type  FOTO AT LEAST EVERY 5TH VISIT, 10TH VISIT PROGRESS NOTE AND KX MODIFIER AFTER THE 15 VISIT.    PT Start Time  1344    PT Stop Time  1439    PT Time Calculation (min)  55 min    Activity Tolerance  Patient tolerated treatment well    Behavior During Therapy  WFL for tasks assessed/performed       Past Medical History:  Diagnosis Date  . Arthritis   . BPH (benign prostatic hyperplasia)   . Charcot-Marie-Tooth disease   . Foot drop, right   . Neuropathy   . Platelets decreased ([place])   . Spinal stenosis     Past Surgical History:  Procedure Laterality Date  . CERVICAL DISC SURGERY  10 YEARS AGO   reports there are plates and screws in place   . FOOT ARTHROPLASTY    . INGUINAL HERNIA REPAIR Right 06/15/2014   Procedure: RIGHT INGUINAL HERNIORRHAPHY  WITH MESH;  Surgeon: Aviva Signs Md, MD;  Location: [facility];  Service: General;  Laterality: Right;  . INSERTION OF MESH Right 06/15/2014   Procedure: INSERTION OF MESH RIGHT INGUINAL HERNIORRHAPHY;  Surgeon: Aviva Signs Md, MD;  Location: [facility];  Service: General;  Laterality: Right;  . KNEE ARTHROSCOPY Right   . TOTAL KNEE ARTHROPLASTY Right 01/21/2018   Procedure: TOTAL KNEE ARTHROPLASTY;  Surgeon: Sydnee Cabal, MD;  Location: [facility];  Service: Orthopedics;  Laterality: Right;    There were no vitals filed for this visit.  Subjective Assessment - 04/05/18 1348    Subjective  Reports that he did a lot at the gym and his knee is pretty sore today.    Pertinent History  Pt  reports doing good after last Rx and continues to work on ROM    Patient Stated Goals  Get knee better.    Currently in Pain?  Yes    Pain Score  --   No pain score provided today   Pain Location  Knee    Pain Orientation  Right    Pain Descriptors / Indicators  Sore    Pain Type  Surgical pain    Pain Onset  More than a month ago    Pain Frequency  Intermittent         [facility] PT Assessment - 04/05/18 0001      Assessment   Medical Diagnosis  Right total knee replacement.    Referring Provider (PT)  Sydnee Cabal MD    Onset Date/Surgical Date  01/21/18    Next MD Visit  "2 weeks"      Restrictions   Weight Bearing Restrictions  No      ROM / Strength   AROM / PROM / Strength  AROM      AROM   Overall AROM   Deficits    AROM Assessment Site  Knee    Right/Left Knee  Right    Right Knee Extension  10    Right Knee Flexion  112  [facility] Adult PT Treatment/Exercise - 04/05/18 0001      Knee/Hip Exercises: Stretches   Passive Hamstring Stretch  Right;3 reps;30 seconds      Knee/Hip Exercises: Aerobic   Nustep  Level 5 x 15 minutes moving forward as tolerated to increase right knee flexion.      Knee/Hip Exercises: Machines for Strengthening   Cybex Knee Extension  10# x 30 reps    Cybex Knee Flexion  30# 3x10 reps    Cybex Leg Press  2.5 pl x30 reps      Knee/Hip Exercises: Standing   Terminal Knee Extension  Strengthening;Right;15 reps;Limitations    Terminal Knee Extension Limitations  Pink XTS with 5 sec hold each rep    Wall Squat  15 reps      Modalities   Modalities  Electrical Stimulation;Vasopneumatic      Electrical Stimulation   Electrical Stimulation Location  R knee    Electrical Stimulation Action  IFC    Electrical Stimulation Parameters  80-150 hz x15 min    Electrical Stimulation Goals  Edema;Pain      Vasopneumatic   Number Minutes Vasopneumatic   15 minutes    Vasopnuematic Location   Knee    Vasopneumatic  Pressure  Low    Vasopneumatic Temperature   34               PT Short Term Goals - 02/21/18 1448      PT SHORT TERM GOAL #1   Title  Full right active knee extension in order to normalize gait.    Time  4    Period  Weeks    Status  New        PT Long Term Goals - 03/29/18 1644      PT LONG TERM GOAL #1   Title  Independent with a HEP.    Time  4    Period  Weeks    Status  Achieved      PT LONG TERM GOAL #2   Title  Active right knee flexion to 115 degrees+ so the patient can perform functional tasks and do so with pain not > 2-3/10.    Time  4    Period  Weeks    Status  Partially Met      PT LONG TERM GOAL #3   Title  Increase right knee strength to 5/5 to provide good stability for accomplishment of functional activities.    Time  4    Period  Weeks    Status  On-going      PT LONG TERM GOAL #4   Title  Decrease edema to within 2.5 cms of non-affected side to assist with pain reduction and range of motion gains.    Time  4    Period  Weeks    Status  Partially Met      PT LONG TERM GOAL #5   Title  Perform a reciprocating stair gait with one railing with pain not > 2-3/10.    Time  4    Period  Weeks    Status  On-going            Plan - 04/05/18 1428    Clinical Impression Statement  Patient progressed with more quad strengthening to improve R knee extension. Patient able to complete all exercises with no complaints of any increased discomfort in knees. AROM R knee measured as 10-112 deg. Tight R HS palpated today and patient reports HS  stretching and has started prone hang. Normal modalities response noted following removal of the modalities.    Rehab Potential  Good    PT Frequency  2x / week    PT Duration  4 weeks    PT Treatment/Interventions  ADLs/Self Care Home Management;Cryotherapy;Electrical Stimulation;Stair training;Functional mobility training;Gait training;Therapeutic activities;Therapeutic exercise;Patient/family  education;Neuromuscular re-education;Manual techniques;Vasopneumatic Device;Passive range of motion    PT Next Visit Plan  Cont with strengthening and ROM progression    Consulted and Agree with Plan of Care  Patient       Patient will benefit from skilled therapeutic intervention in order to improve the following deficits and impairments:  Abnormal gait, Decreased activity tolerance, Decreased range of motion, Decreased strength, Increased edema, Pain  Visit Diagnosis: Chronic pain of right knee  Stiffness of right knee, not elsewhere classified  Localized edema     Problem List Patient Active Problem List   Diagnosis Date Noted  . 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 Cervical stenosis of spinal canal 11/16/2017  . 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 Foot drop, right 07/15/2017  . Gastroesophageal reflux disease with esophagitis 11/30/2016  . Benign prostatic hyperplasia with elevated prostate specific antigen (PSA) 11/30/2016    Standley Brooking, PTA 04/05/2018, 3:12 PM  [facility] [address] [place], [place], [address] Phone: [phone]   Fax:  [phone]  Name: Philip Stone MRN: [id number] Date of Birth: [birth year]

## 2018-04-07 ENCOUNTER — Ambulatory Visit: Payer: PPO | Admitting: *Deleted

## 2018-04-07 DIAGNOSIS — M25561 Pain in right knee: Principal | ICD-10-CM

## 2018-04-07 DIAGNOSIS — G8929 Other chronic pain: Secondary | ICD-10-CM

## 2018-04-07 DIAGNOSIS — M25661 Stiffness of right knee, not elsewhere classified: Secondary | ICD-10-CM

## 2018-04-07 DIAGNOSIS — R600 Localized edema: Secondary | ICD-10-CM

## 2018-04-07 NOTE — Therapy (Signed)
[facility] [place], [place], [address] Phone: [phone]   Fax:  [phone]  Physical Therapy Treatment  Patient Details  Name: Philip Stone MRN: [id number] Date of Birth: February 26, 1950 Referring Provider (PT): Sydnee Cabal MD   Encounter Date: 04/07/2018  PT End of Session - 04/07/18 1802    Visit Number  14    Number of Visits  18    Date for PT Re-Evaluation  04/22/18    Authorization Type  FOTO AT LEAST EVERY 5TH VISIT, 10TH VISIT PROGRESS NOTE AND KX MODIFIER AFTER THE 15 VISIT.    PT Start Time  1345    PT Stop Time  1445    PT Time Calculation (min)  60 min       Past Medical History:  Diagnosis Date  . Arthritis   . BPH (benign prostatic hyperplasia)   . Charcot-Marie-Tooth disease   . Foot drop, right   . Neuropathy   . Platelets decreased ([place])   . Spinal stenosis     Past Surgical History:  Procedure Laterality Date  . CERVICAL DISC SURGERY  10 YEARS AGO   reports there are plates and screws in place   . FOOT ARTHROPLASTY    . INGUINAL HERNIA REPAIR Right 06/15/2014   Procedure: RIGHT INGUINAL HERNIORRHAPHY  WITH MESH;  Surgeon: Aviva Signs Md, MD;  Location: [facility];  Service: General;  Laterality: Right;  . INSERTION OF MESH Right 06/15/2014   Procedure: INSERTION OF MESH RIGHT INGUINAL HERNIORRHAPHY;  Surgeon: Aviva Signs Md, MD;  Location: [facility];  Service: General;  Laterality: Right;  . KNEE ARTHROSCOPY Right   . TOTAL KNEE ARTHROPLASTY Right 01/21/2018   Procedure: TOTAL KNEE ARTHROPLASTY;  Surgeon: Sydnee Cabal, MD;  Location: [facility];  Service: Orthopedics;  Laterality: Right;    There were no vitals filed for this visit.  Subjective Assessment - 04/07/18 1403    Subjective  RT knee 3/10 today    Pertinent History  Pt reports doing good after last Rx and continues to work on ROM    Patient Stated Goals  Get knee better.    Currently in Pain?  Yes    Pain Score  3     Pain  Orientation  Right    Pain Descriptors / Indicators  Sore                       [facility] Adult PT Treatment/Exercise - 04/07/18 0001      Knee/Hip Exercises: Aerobic   Nustep  Level 5 x 15 minutes moving forward as tolerated to increase right knee flexion.      Knee/Hip Exercises: Machines for Strengthening   Cybex Knee Extension  10# x 30 reps    Cybex Knee Flexion  30# 3x10 reps    Cybex Leg Press  2.5 pl x30 reps      Knee/Hip Exercises: Standing   Terminal Knee Extension  Strengthening;Right;Limitations;3 sets;10 reps    Terminal Knee Extension Limitations  Pink XTS with 5 sec hold each rep    Wall Squat  --      Modalities   Modalities  Electrical Stimulation;Vasopneumatic      Electrical Stimulation   Electrical Stimulation Location  Right knee.IFC 1-10 Hz x 15 min    Electrical Stimulation Goals  Edema;Pain      Vasopneumatic   Number Minutes Vasopneumatic   15 minutes    Vasopnuematic Location   Knee  Vasopneumatic Pressure  Low    Vasopneumatic Temperature   34               PT Short Term Goals - 02/21/18 1448      PT SHORT TERM GOAL #1   Title  Full right active knee extension in order to normalize gait.    Time  4    Period  Weeks    Status  New        PT Long Term Goals - 03/29/18 1644      PT LONG TERM GOAL #1   Title  Independent with a HEP.    Time  4    Period  Weeks    Status  Achieved      PT LONG TERM GOAL #2   Title  Active right knee flexion to 115 degrees+ so the patient can perform functional tasks and do so with pain not > 2-3/10.    Time  4    Period  Weeks    Status  Partially Met      PT LONG TERM GOAL #3   Title  Increase right knee strength to 5/5 to provide good stability for accomplishment of functional activities.    Time  4    Period  Weeks    Status  On-going      PT LONG TERM GOAL #4   Title  Decrease edema to within 2.5 cms of non-affected side to assist with pain reduction and range of motion  gains.    Time  4    Period  Weeks    Status  Partially Met      PT LONG TERM GOAL #5   Title  Perform a reciprocating stair gait with one railing with pain not > 2-3/10.    Time  4    Period  Weeks    Status  On-going            Plan - 04/07/18 1806    Clinical Impression Statement  Pt arrived today doing fairly well with mainly stiffness. He was able to perform all therex for RT knee with progression of ex with mainly fatigue. His ROM was about vthe same at 10-112 degrees. Normal modality response    Clinical Presentation  Stable    Clinical Decision Making  Low    Rehab Potential  Good    PT Frequency  2x / week    PT Duration  4 weeks    PT Treatment/Interventions  ADLs/Self Care Home Management;Cryotherapy;Electrical Stimulation;Stair training;Functional mobility training;Gait training;Therapeutic activities;Therapeutic exercise;Patient/family education;Neuromuscular re-education;Manual techniques;Vasopneumatic Device;Passive range of motion    PT Next Visit Plan  Cont with strengthening and ROM progression   To MD next month    Consulted and Agree with Plan of Care  Patient       Patient will benefit from skilled therapeutic intervention in order to improve the following deficits and impairments:  Abnormal gait, Decreased activity tolerance, Decreased range of motion, Decreased strength, Increased edema, Pain  Visit Diagnosis: Chronic pain of right knee  Stiffness of right knee, not elsewhere classified  Localized edema     Problem List Patient Active Problem List   Diagnosis Date Noted  . 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 Cervical stenosis of spinal canal 11/16/2017  . 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 Foot drop, right 07/15/2017  . Gastroesophageal reflux disease with esophagitis 11/30/2016  . Benign prostatic hyperplasia with elevated prostate specific antigen (PSA) 11/30/2016  Maryela Tapper,CHRIS, PTA 04/07/2018, 6:10 PM  [facility] [address] [place], [place], [address] Phone: [phone]   Fax:  [phone]  Name: Philip Stone MRN: [id number] Date of Birth: [birth year]

## 2018-04-08 ENCOUNTER — Other Ambulatory Visit: Payer: Self-pay | Admitting: Family Medicine

## 2018-04-08 NOTE — Telephone Encounter (Signed)
Last seen 10/20/17

## 2018-04-12 ENCOUNTER — Ambulatory Visit: Payer: PPO | Attending: Specialist | Admitting: Physical Therapy

## 2018-04-12 ENCOUNTER — Encounter: Payer: Self-pay | Admitting: Physical Therapy

## 2018-04-12 DIAGNOSIS — R600 Localized edema: Secondary | ICD-10-CM | POA: Diagnosis not present

## 2018-04-12 DIAGNOSIS — G8929 Other chronic pain: Secondary | ICD-10-CM

## 2018-04-12 DIAGNOSIS — M25561 Pain in right knee: Secondary | ICD-10-CM | POA: Diagnosis not present

## 2018-04-12 DIAGNOSIS — M25661 Stiffness of right knee, not elsewhere classified: Secondary | ICD-10-CM | POA: Insufficient documentation

## 2018-04-12 NOTE — Therapy (Signed)
[facility] [place], [place], [address] Phone: [phone]   Fax:  [phone]  Physical Therapy Treatment  Patient Details  Name: Philip Stone MRN: [id number] Date of Birth: Aug 20, 1950 Referring Provider (PT): Sydnee Cabal MD   Encounter Date: 04/12/2018  PT End of Session - 04/12/18 1358    Visit Number  15    Number of Visits  18    Date for PT Re-Evaluation  04/22/18    Authorization Type  FOTO AT LEAST EVERY 5TH VISIT, 10TH VISIT PROGRESS NOTE AND KX MODIFIER AFTER THE 15 VISIT.    PT Start Time  1345    PT Stop Time  1443    PT Time Calculation (min)  58 min    Activity Tolerance  Patient tolerated treatment well    Behavior During Therapy  WFL for tasks assessed/performed       Past Medical History:  Diagnosis Date  . Arthritis   . BPH (benign prostatic hyperplasia)   . Charcot-Marie-Tooth disease   . Foot drop, right   . Neuropathy   . Platelets decreased ([place])   . Spinal stenosis     Past Surgical History:  Procedure Laterality Date  . CERVICAL DISC SURGERY  10 YEARS AGO   reports there are plates and screws in place   . FOOT ARTHROPLASTY    . INGUINAL HERNIA REPAIR Right 06/15/2014   Procedure: RIGHT INGUINAL HERNIORRHAPHY  WITH MESH;  Surgeon: Aviva Signs Md, MD;  Location: [facility];  Service: General;  Laterality: Right;  . INSERTION OF MESH Right 06/15/2014   Procedure: INSERTION OF MESH RIGHT INGUINAL HERNIORRHAPHY;  Surgeon: Aviva Signs Md, MD;  Location: [facility];  Service: General;  Laterality: Right;  . KNEE ARTHROSCOPY Right   . TOTAL KNEE ARTHROPLASTY Right 01/21/2018   Procedure: TOTAL KNEE ARTHROPLASTY;  Surgeon: Sydnee Cabal, MD;  Location: [facility];  Service: Orthopedics;  Laterality: Right;    There were no vitals filed for this visit.  Subjective Assessment - 04/12/18 1357    Subjective  Patient reports seeing the MD for a follow up on 04/20/2018    Pertinent History  Pt reports doing  good after last Rx and continues to work on ROM    Patient Stated Goals  Get knee better.    Currently in Pain?  Yes    Pain Score  2          [facility] PT Assessment - 04/12/18 0001      Assessment   Medical Diagnosis  Right total knee replacement.    Referring Provider (PT)  Sydnee Cabal MD    Onset Date/Surgical Date  01/21/18    Next MD Visit  04/20/2018      AROM   Right Knee Extension  8    Right Knee Flexion  111      PROM   Overall PROM Comments  117 knee flexion                   [facility] Adult PT Treatment/Exercise - 04/12/18 0001      Knee/Hip Exercises: Aerobic   Nustep  Level 5 x 15 minutes moving forward as tolerated to increase right knee flexion.      Knee/Hip Exercises: Machines for Strengthening   Cybex Knee Extension  10# x 30 reps    Cybex Knee Flexion  30# 3x10 reps    Cybex Leg Press  2.5 pl x30 reps      Knee/Hip  Exercises: Standing   Terminal Knee Extension  Strengthening;Right;Limitations;3 sets;10 reps    Terminal Knee Extension Limitations  Pink XTS with 3 sec hold each rep      Modalities   Modalities  Electrical Stimulation;Vasopneumatic      Electrical Stimulation   Electrical Stimulation Location  Right knee    Electrical Stimulation Action  IFC    Electrical Stimulation Parameters  80-150 hz x15 mins    Electrical Stimulation Goals  Edema;Pain      Vasopneumatic   Number Minutes Vasopneumatic   15 minutes    Vasopnuematic Location   Knee    Vasopneumatic Pressure  Low    Vasopneumatic Temperature   34               PT Short Term Goals - 04/12/18 1410      PT SHORT TERM GOAL #1   Title  Full right active knee extension in order to normalize gait.    Time  4    Period  Weeks    Status  Not Met        PT Long Term Goals - 04/12/18 1410      PT LONG TERM GOAL #1   Title  Independent with a HEP.    Time  4    Period  Weeks    Status  Achieved      PT LONG TERM GOAL #2   Title  Active right knee flexion  to 115 degrees+ so the patient can perform functional tasks and do so with pain not > 2-3/10.    Time  4    Period  Weeks    Status  Partially Met      PT LONG TERM GOAL #3   Title  Increase right knee strength to 5/5 to provide good stability for accomplishment of functional activities.    Time  4    Status  On-going   4+/5     PT LONG TERM GOAL #4   Title  Decrease edema to within 2.5 cms of non-affected side to assist with pain reduction and range of motion gains.    Time  4    Period  Weeks    Status  Partially Met      PT LONG TERM GOAL #5   Title  Perform a reciprocating stair gait with one railing with pain not > 2-3/10.    Time  4    Period  Weeks    Status  On-going            Plan - 04/12/18 1451    Clinical Impression Statement  Patient was able to tolerate treatment well despite ongoing stiffness. Patient was able to perform all reps with just reports of fatigue. Patient's goals are ongoing at this time but are progressing towards them. FOTO limitation 45%, predicted limitation 43%. MD note sent for 04/20/2018 follow up visit     Stability/Clinical Decision Making  Stable/Uncomplicated    Clinical Decision Making  Low    Rehab Potential  Good    PT Frequency  2x / week    PT Duration  4 weeks    PT Treatment/Interventions  ADLs/Self Care Home Management;Cryotherapy;Electrical Stimulation;Stair training;Functional mobility training;Gait training;Therapeutic activities;Therapeutic exercise;Patient/family education;Neuromuscular re-education;Manual techniques;Vasopneumatic Device;Passive range of motion    PT Next Visit Plan  Cont with strengthening and ROM progression; MD 04/20/2018    Consulted and Agree with Plan of Care  Patient       Patient will  benefit from skilled therapeutic intervention in order to improve the following deficits and impairments:  Abnormal gait, Decreased activity tolerance, Decreased range of motion, Decreased strength, Increased edema,  Pain  Visit Diagnosis: Chronic pain of right knee  Stiffness of right knee, not elsewhere classified  Localized edema     Problem List Patient Active Problem List   Diagnosis Date Noted  . 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 Cervical stenosis of spinal canal 11/16/2017  . 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 Foot drop, right 07/15/2017  . Gastroesophageal reflux disease with esophagitis 11/30/2016  . Benign prostatic hyperplasia with elevated prostate specific antigen (PSA) 11/30/2016   Gabriela Eves, PT, DPT 04/12/2018, 2:54 PM  [facility] [address] [place], [place], [address] Phone: [phone]   Fax:  [phone]  Name: Philip Stone MRN: [id number] Date of Birth: Jun 10, 1950

## 2018-04-14 ENCOUNTER — Encounter: Payer: PPO | Admitting: Physical Therapy

## 2018-04-19 ENCOUNTER — Ambulatory Visit: Payer: PPO | Admitting: *Deleted

## 2018-04-19 DIAGNOSIS — M25561 Pain in right knee: Secondary | ICD-10-CM | POA: Diagnosis not present

## 2018-04-19 DIAGNOSIS — M25661 Stiffness of right knee, not elsewhere classified: Secondary | ICD-10-CM

## 2018-04-19 DIAGNOSIS — R600 Localized edema: Secondary | ICD-10-CM

## 2018-04-19 DIAGNOSIS — G8929 Other chronic pain: Secondary | ICD-10-CM

## 2018-04-19 NOTE — Therapy (Signed)
Philip Stone, Philip Stone, [id number] Phone: [phone]   Fax:  [phone]  Physical Therapy Treatment  Patient Details  Name: Philip Stone MRN: [id number] Date of Birth: July 22, 1950 Referring Provider (PT): Sydnee Cabal MD   Encounter Date: 04/19/2018  PT End of Session - 04/19/18 1401    Visit Number  16    Number of Visits  18    Date for PT Re-Evaluation  04/22/18    Authorization Type  FOTO AT LEAST EVERY 5TH VISIT, 10TH VISIT PROGRESS NOTE AND KX MODIFIER AFTER THE 15 VISIT.    PT Start Time  1345    PT Stop Time  1443    PT Time Calculation (min)  58 min       Past Medical History:  Diagnosis Date  . Arthritis   . BPH (benign prostatic hyperplasia)   . Charcot-Marie-Tooth disease   . Foot drop, right   . Neuropathy   . Platelets decreased ([place])   . Spinal stenosis     Past Surgical History:  Procedure Laterality Date  . CERVICAL DISC SURGERY  10 YEARS AGO   reports there are plates and screws in place   . FOOT ARTHROPLASTY    . INGUINAL HERNIA REPAIR Right 06/15/2014   Procedure: RIGHT INGUINAL HERNIORRHAPHY  WITH MESH;  Surgeon: Aviva Signs Md, MD;  Location: [facility];  Service: General;  Laterality: Right;  . INSERTION OF MESH Right 06/15/2014   Procedure: INSERTION OF MESH RIGHT INGUINAL HERNIORRHAPHY;  Surgeon: Aviva Signs Md, MD;  Location: [facility];  Service: General;  Laterality: Right;  . KNEE ARTHROSCOPY Right   . TOTAL KNEE ARTHROPLASTY Right 01/21/2018   Procedure: TOTAL KNEE ARTHROPLASTY;  Surgeon: Sydnee Cabal, MD;  Location: [facility];  Service: Orthopedics;  Laterality: Right;    There were no vitals filed for this visit.                    [place] Adult PT Treatment/Exercise - 04/19/18 0001      Knee/Hip Exercises: Aerobic   Nustep  Level 5 x 15 minutes moving forward as tolerated to increase right knee flexion.      Knee/Hip Exercises: Machines for Strengthening    Cybex Knee Extension  10# x 30 reps    Cybex Knee Flexion  30# 3x10 reps    Cybex Leg Press  2.5 pl x30 reps      Knee/Hip Exercises: Standing   Terminal Knee Extension  Strengthening;Right;Limitations;3 sets;10 reps    Terminal Knee Extension Limitations  Pink XTS with 3 sec hold each rep  x20      Modalities   Modalities  Electrical Stimulation;Vasopneumatic      Electrical Stimulation   Electrical Stimulation Location  Right knee    Electrical Stimulation Action  IFC    Electrical Stimulation Parameters  80-150hz    Electrical Stimulation Goals  Edema;Pain      Vasopneumatic   Number Minutes Vasopneumatic   15 minutes    Vasopnuematic Location   Knee    Vasopneumatic Pressure  Low    Vasopneumatic Temperature   34               PT Short Term Goals - 04/12/18 1410      PT SHORT TERM GOAL #1   Title  Full right active knee extension in order to normalize gait.    Time  4    Period  Weeks  Status  Not Met        PT Long Term Goals - 04/19/18 1409      PT LONG TERM GOAL #1   Title  Independent with a HEP.    Time  4    Period  Weeks    Status  Achieved      PT LONG TERM GOAL #2   Title  Active right knee flexion to 115 degrees+ so the patient can perform functional tasks and do so with pain not > 2-3/10.    Time  4    Period  Weeks    Status  Partially Met   NM 110 degrees AROM     PT LONG TERM GOAL #3   Title  Increase right knee strength to 5/5 to provide good stability for accomplishment of functional activities.    Time  4    Period  Weeks    Status  Partially Met   4/5     PT LONG TERM GOAL #4   Title  Decrease edema to within 2.5 cms of non-affected side to assist with pain reduction and range of motion gains.    Time  4    Period  Weeks    Status  Not Met   4 cms >     PT LONG TERM GOAL #5   Title  Perform a reciprocating stair gait with one railing with pain not > 2-3/10.    Time  4    Period  Weeks    Status  Achieved             Plan - 04/19/18 1412    Clinical Impression Statement  Pt arrived today doing fairly well and reports f/u with MD tomorrow with possible DC. He did well with therex and was able to meet LTG for stair negotiation. 43% limitation FOTO Normal modality responses today.    Stability/Clinical Decision Making  Stable/Uncomplicated    Rehab Potential  Good    PT Frequency  2x / week    PT Duration  4 weeks    PT Treatment/Interventions  ADLs/Self Care Home Management;Cryotherapy;Electrical Stimulation;Stair training;Functional mobility training;Gait training;Therapeutic activities;Therapeutic exercise;Patient/family education;Neuromuscular re-education;Manual techniques;Vasopneumatic Device;Passive range of motion    PT Next Visit Plan   MD note with possible DC to HEP/Gym       Patient will benefit from skilled therapeutic intervention in order to improve the following deficits and impairments:  Abnormal gait, Decreased activity tolerance, Decreased range of motion, Decreased strength, Increased edema, Pain  Visit Diagnosis: Chronic pain of right knee  Localized edema  Stiffness of right knee, not elsewhere classified     Problem List Patient Active Problem List   Diagnosis Date Noted  . 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 Cervical stenosis of spinal canal 11/16/2017  . 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 Foot drop, right 07/15/2017  . Gastroesophageal reflux disease with esophagitis 11/30/2016  . Benign prostatic hyperplasia with elevated prostate specific antigen (PSA) 11/30/2016    RAMSEUR,Philip, PTA 04/19/2018, 5:39 PM  [facility], Philip Stone, [address] Phone: [phone]   Fax:  [phone]  Name: Philip Stone MRN: [id number] Date of Birth: 1950/10/10  PHYSICAL THERAPY DISCHARGE SUMMARY  Visits from Start of Care: 16.  Current functional level related to goals / functional  outcomes: See above.   Remaining deficits: See goal section.   Education / Equipment: HEP.  Plan: Patient agrees to discharge.  Patient goals were partially  met. Patient is being discharged due to meeting the stated rehab goals.  ?????         Mali Applegate MPT

## 2018-04-20 ENCOUNTER — Encounter: Payer: PPO | Admitting: Family Medicine

## 2018-04-20 DIAGNOSIS — Z96651 Presence of right artificial knee joint: Secondary | ICD-10-CM | POA: Diagnosis not present

## 2018-04-20 DIAGNOSIS — Z471 Aftercare following joint replacement surgery: Secondary | ICD-10-CM | POA: Diagnosis not present

## 2018-04-28 ENCOUNTER — Encounter: Payer: PPO | Admitting: Family Medicine

## 2018-05-05 ENCOUNTER — Telehealth: Payer: PPO

## 2018-05-09 ENCOUNTER — Telehealth: Payer: PPO | Admitting: *Deleted

## 2018-06-22 ENCOUNTER — Other Ambulatory Visit: Payer: Self-pay | Admitting: Family Medicine

## 2018-06-23 NOTE — Telephone Encounter (Signed)
Stacks. NTBS RF for 30 days given

## 2018-07-02 ENCOUNTER — Other Ambulatory Visit: Payer: Self-pay | Admitting: Family Medicine

## 2018-07-05 NOTE — Telephone Encounter (Signed)
lmtcb to schedule f/u visit with Dr Livia Snellen.

## 2018-07-05 NOTE — Telephone Encounter (Signed)
Stacks. NTBS. Last OV for Dx 04/2017

## 2018-07-06 ENCOUNTER — Other Ambulatory Visit: Payer: Self-pay

## 2018-07-07 ENCOUNTER — Encounter (INDEPENDENT_AMBULATORY_CARE_PROVIDER_SITE_OTHER): Payer: Self-pay

## 2018-07-07 NOTE — Telephone Encounter (Signed)
Appointment with provider scheduled for 07-08-18.

## 2018-07-08 ENCOUNTER — Ambulatory Visit (INDEPENDENT_AMBULATORY_CARE_PROVIDER_SITE_OTHER): Payer: PPO | Admitting: Family Medicine

## 2018-07-08 ENCOUNTER — Encounter: Payer: Self-pay | Admitting: Family Medicine

## 2018-07-08 ENCOUNTER — Other Ambulatory Visit: Payer: Self-pay

## 2018-07-08 VITALS — BP 147/78 | HR 69 | Temp 97.8°F | Ht 70.0 in | Wt 194.0 lb

## 2018-07-08 DIAGNOSIS — K2100 Gastro-esophageal reflux disease with esophagitis, without bleeding: Secondary | ICD-10-CM

## 2018-07-08 DIAGNOSIS — B356 Tinea cruris: Secondary | ICD-10-CM | POA: Diagnosis not present

## 2018-07-08 DIAGNOSIS — R972 Elevated prostate specific antigen [PSA]: Secondary | ICD-10-CM | POA: Diagnosis not present

## 2018-07-08 DIAGNOSIS — R030 Elevated blood-pressure reading, without diagnosis of hypertension: Secondary | ICD-10-CM | POA: Diagnosis not present

## 2018-07-08 DIAGNOSIS — N400 Enlarged prostate without lower urinary tract symptoms: Secondary | ICD-10-CM | POA: Diagnosis not present

## 2018-07-08 MED ORDER — PANTOPRAZOLE SODIUM 40 MG PO TBEC: 40.0000 mg | DELAYED_RELEASE_TABLET | Freq: Every day | ORAL | 0 refills | Status: DC

## 2018-07-08 MED ORDER — TAMSULOSIN HCL 0.4 MG PO CAPS: ORAL_CAPSULE | ORAL | 0 refills | Status: DC

## 2018-07-08 MED ORDER — ECONAZOLE NITRATE 1 % EX CREA: TOPICAL_CREAM | Freq: Two times a day (BID) | CUTANEOUS | 5 refills | Status: DC

## 2018-07-08 NOTE — Progress Notes (Signed)
Subjective:  Patient ID: Philip Stone, male    DOB: 08-Jun-1950  Age: 68 y.o. MRN: [id number]  CC: Medication Refill   HPI Philip Stone presents for patient in for follow-up of GERD. Currently asymptomatic taking  PPI daily. There is no chest pain or heartburn. No hematemesis and no melena. No dysphagia or choking. Onset is remote. Progression is stable. Complicating factors, none.   Patient also has concerned about odor and excessive sweating in the perineal region.  He denies rash and itching.  It is been there for quite some time he would like treatment for that.  Patient states that he is having to get up about once a night to go to the bathroom.  He does have some morning frequency.  He recently started taking his tamsulosin 2 pills at bedtime instead of twice daily to see if that would help.  He may have 3-4 episodes in the morning but after that he is not having frequency through the day.  Depression screen [facility] 2/9 07/08/2018 10/20/2017 05/17/2017  Decreased Interest 0 0 0  Down, Depressed, Hopeless 0 0 0  PHQ - 2 Score 0 0 0  Altered sleeping 0 - -  Tired, decreased energy 0 - -  Change in appetite 0 - -  Feeling bad or failure about yourself  0 - -  Trouble concentrating 0 - -  Moving slowly or fidgety/restless 0 - -  Suicidal thoughts 0 - -  PHQ-9 Score 0 - -    History Philip Stone has a past medical history of Arthritis, BPH (benign prostatic hyperplasia), Charcot-Marie-Tooth disease, Foot drop, right, Neuropathy, Platelets decreased ([place]), and Spinal stenosis.   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   His family history includes Other in his father; Suicidality in his mother.He reports that he quit smoking about 12 years ago. His smoking use included cigarettes. He  started smoking about 29 years ago. He has a 18.00 pack-year smoking history. He has never used smokeless tobacco. He reports that he does not drink alcohol or use drugs.    ROS Review of Systems  Constitutional: Negative.   HENT: Negative.   Eyes: Negative for visual disturbance.  Respiratory: Negative for cough and shortness of breath.   Cardiovascular: Negative for chest pain and leg swelling.  Gastrointestinal: Negative for abdominal pain, diarrhea, nausea and vomiting.  Genitourinary: Negative for difficulty urinating.  Musculoskeletal: Negative for arthralgias and myalgias.  Skin: Negative for rash.  Neurological: Negative for headaches.  Psychiatric/Behavioral: Negative for sleep disturbance.    Objective:  BP (!) 147/78   Pulse 69   Temp 97.8 F (36.6 C) (Oral)   Ht 5' 10" (1.778 m)   Wt 194 lb (88 kg)   BMI 27.84 kg/m   BP Readings from Last 3 Encounters:  07/08/18 (!) 147/78  01/23/18 (!) 145/67  01/19/18 (!) 161/86    Wt Readings from Last 3 Encounters:  07/08/18 194 lb (88 kg)  01/21/18 190 lb (86.2 kg)  01/19/18 190 lb (86.2 kg)     Physical Exam Vitals signs reviewed.  Constitutional:      Appearance: He is well-developed.  HENT:     Head: Normocephalic and atraumatic.     Right Ear: Tympanic membrane and external ear normal. No decreased hearing noted.     Left Ear: Tympanic membrane  and external ear normal. No decreased hearing noted.     Mouth/Throat:     Pharynx: No oropharyngeal exudate or posterior oropharyngeal erythema.  Eyes:     Pupils: Pupils are equal, round, and reactive to light.  Neck:     Musculoskeletal: Normal range of motion and neck supple.  Cardiovascular:     Rate and Rhythm: Normal rate and regular rhythm.     Heart sounds: No murmur.  Pulmonary:     Effort: No respiratory distress.     Breath sounds: Normal breath sounds.  Abdominal:     General: Bowel sounds are normal.     Palpations: Abdomen is soft. There is no  mass.     Tenderness: There is no abdominal tenderness.       Assessment & Plan:   Philip Stone was seen today for medication refill.  Diagnoses and all orders for this visit:  Elevated blood pressure reading -     CMP14+EGFR  Benign prostatic hyperplasia with elevated prostate specific antigen (PSA)  Gastroesophageal reflux disease with esophagitis  Tinea cruris  Other orders -     tamsulosin (FLOMAX) 0.4 MG CAPS capsule; TAKE TWO CAPSULES BY MOUTH DAILY AT BEDTIME -     pantoprazole (PROTONIX) 40 MG tablet; Take 1 tablet (40 mg total) by mouth daily. (Needs to be seen before next refill) -     econazole nitrate 1 % cream; Apply topically 2 (two) times daily. To affected areas       I have discontinued Kenichi Cassada multivitamin with minerals, rivaroxaban, cyclobenzaprine, oxyCODONE-acetaminophen, cephALEXin, and meloxicam. I am also having him start on econazole nitrate. Additionally, I am having him maintain his tamsulosin and pantoprazole.  Allergies as of 07/08/2018      Reactions   Asa [aspirin] Anaphylaxis   Prednisone    Makes him lightheaded      Medication List       Accurate as of Jul 08, 2018  5:49 PM. If you have any questions, ask your nurse or doctor.        STOP taking these medications   cephALEXin 500 MG capsule Commonly known as:  KEFLEX Stopped by:  Claretta Fraise, MD   cyclobenzaprine 10 MG tablet Commonly known as:  FLEXERIL Stopped by:  Claretta Fraise, MD   meloxicam 15 MG tablet Commonly known as:  MOBIC Stopped by:  Claretta Fraise, MD   multivitamin with minerals Tabs tablet Stopped by:  Claretta Fraise, MD   oxyCODONE-acetaminophen 5-325 MG tablet Commonly known as:  PERCOCET/ROXICET Stopped by:  Claretta Fraise, MD   rivaroxaban 10 MG Tabs tablet Commonly known as:  Xarelto Stopped by:  Claretta Fraise, MD     TAKE these medications   econazole nitrate 1 % cream Apply topically 2 (two) times daily. To affected areas Started by:   Claretta Fraise, MD   pantoprazole 40 MG tablet Commonly known as:  PROTONIX Take 1 tablet (40 mg total) by mouth daily. (Needs to be seen before next refill)   tamsulosin 0.4 MG Caps capsule Commonly known as:  FLOMAX TAKE TWO CAPSULES BY MOUTH DAILY AT BEDTIME        Follow-up: Return in about 6 months (around 01/08/2019) for Wellness.  Claretta Fraise, M.D.

## 2018-07-08 NOTE — Patient Instructions (Signed)
Return in 6 months for CPe

## 2018-07-09 LAB — CMP14+EGFR
ALT: 18 IU/L (ref 0–44)
AST: 23 IU/L (ref 0–40)
Albumin/Globulin Ratio: 1.6 (ref 1.2–2.2)
Albumin: 4.3 g/dL (ref 3.8–4.8)
Alkaline Phosphatase: 104 IU/L (ref 39–117)
BUN/Creatinine Ratio: 14 (ref 10–24)
BUN: 15 mg/dL (ref 8–27)
Bilirubin Total: 0.5 mg/dL (ref 0.0–1.2)
CO2: 25 mmol/L (ref 20–29)
Calcium: 9.4 mg/dL (ref 8.6–10.2)
Chloride: 104 mmol/L (ref 96–106)
Creatinine, Ser: 1.09 mg/dL (ref 0.76–1.27)
GFR calc Af Amer: 81 mL/min/{1.73_m2} (ref >59)
GFR calc non Af Amer: 70 mL/min/{1.73_m2} (ref >59)
Globulin, Total: 2.7 g/dL (ref 1.5–4.5)
Glucose: 109 mg/dL — ABNORMAL HIGH (ref 65–99)
Potassium: 4.6 mmol/L (ref 3.5–5.2)
Sodium: 144 mmol/L (ref 134–144)
Total Protein: 7 g/dL (ref 6.0–8.5)

## 2018-07-11 NOTE — Progress Notes (Signed)
Hello Rad,  Your lab result is normal.Some minor variations that are not significant are commonly marked abnormal, but do not represent any medical problem for you.  Best regards, Kamsiyochukwu Spickler, M.D.

## 2018-09-13 DIAGNOSIS — Z1211 Encounter for screening for malignant neoplasm of colon: Secondary | ICD-10-CM | POA: Diagnosis not present

## 2018-10-02 ENCOUNTER — Other Ambulatory Visit: Payer: Self-pay | Admitting: Family Medicine

## 2018-12-11 ENCOUNTER — Other Ambulatory Visit: Payer: Self-pay | Admitting: Family Medicine

## 2019-01-09 ENCOUNTER — Ambulatory Visit (INDEPENDENT_AMBULATORY_CARE_PROVIDER_SITE_OTHER): Payer: PPO | Admitting: Family Medicine

## 2019-01-09 ENCOUNTER — Encounter: Payer: Self-pay | Admitting: Family Medicine

## 2019-01-09 ENCOUNTER — Other Ambulatory Visit: Payer: Self-pay

## 2019-01-09 VITALS — BP 156/81 | HR 58 | Temp 97.7°F | Ht 70.0 in | Wt 194.6 lb

## 2019-01-09 DIAGNOSIS — J312 Chronic pharyngitis: Secondary | ICD-10-CM

## 2019-01-09 DIAGNOSIS — K2100 Gastro-esophageal reflux disease with esophagitis, without bleeding: Secondary | ICD-10-CM | POA: Diagnosis not present

## 2019-01-09 DIAGNOSIS — N400 Enlarged prostate without lower urinary tract symptoms: Secondary | ICD-10-CM | POA: Diagnosis not present

## 2019-01-09 DIAGNOSIS — B356 Tinea cruris: Secondary | ICD-10-CM | POA: Diagnosis not present

## 2019-01-09 DIAGNOSIS — R972 Elevated prostate specific antigen [PSA]: Secondary | ICD-10-CM

## 2019-01-09 DIAGNOSIS — R42 Dizziness and giddiness: Secondary | ICD-10-CM

## 2019-01-09 MED ORDER — CLOTRIMAZOLE-BETAMETHASONE 1-0.05 % EX CREA: 1.0000 "application " | TOPICAL_CREAM | Freq: Two times a day (BID) | CUTANEOUS | 1 refills | Status: DC

## 2019-01-09 MED ORDER — SILODOSIN 4 MG PO CAPS: 4.0000 mg | ORAL_CAPSULE | Freq: Every day | ORAL | 2 refills | Status: DC

## 2019-01-09 NOTE — Progress Notes (Signed)
Subjective:  Patient ID: Philip Stone, male    DOB: 06-Nov-1950  Age: 68 y.o. MRN: [id number]  CC: Medical Management of Chronic Issues (Patient complains about getting dizzy at times. Also stats the back of his throat has been sore for months.)   HPI Philip Stone presents for light headed episoeds occasionally for the last 2 months. ON two occasions last month it made him feel like Philip Stone might pass out. These lasted 5-10 minutes. Philip Stone denies LOC. No vertigo.  No previous similar problem. H states the flomax helps. Has Nocturia X 1 and frequency in AMs. No Dysuria. His rash went away, but has come back.   Blood pressure noted. No lifestyle changes noted. Only medical change is as above, dizziness, which is not currently present.   Depression screen [facility] 2/9 07/08/2018 10/20/2017 05/17/2017  Decreased Interest 0 0 0  Down, Depressed, Hopeless 0 0 0  PHQ - 2 Score 0 0 0  Altered sleeping 0 - -  Tired, decreased energy 0 - -  Change in appetite 0 - -  Feeling bad or failure about yourself  0 - -  Trouble concentrating 0 - -  Moving slowly or fidgety/restless 0 - -  Suicidal thoughts 0 - -  PHQ-9 Score 0 - -    History Philip Stone has a past medical history of Arthritis, BPH (benign prostatic hyperplasia), Charcot-Marie-Tooth disease, Foot drop, right, Neuropathy, Platelets decreased ([place]), and Spinal stenosis.   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   His family history includes Other in his father; Suicidality in his mother.Philip Stone reports that Philip Stone quit smoking about 12 years ago. His smoking use included cigarettes. Philip Stone started smoking about 29 years ago. Philip Stone has a 18.00 pack-year smoking history. Philip Stone has never used smokeless tobacco. Philip Stone reports that Philip Stone does not drink alcohol or use  drugs.    ROS Review of Systems  Constitutional: Negative.   HENT: Negative.   Eyes: Negative for visual disturbance.  Respiratory: Negative for cough and shortness of breath.   Cardiovascular: Negative for chest pain and leg swelling.  Gastrointestinal: Negative for abdominal pain, diarrhea, nausea and vomiting.  Genitourinary: Negative for difficulty urinating.  Musculoskeletal: Negative for arthralgias and myalgias.  Skin: Negative for rash.  Neurological: Positive for dizziness. Negative for headaches.  Psychiatric/Behavioral: Negative for sleep disturbance.    Objective:  BP (!) 156/81   Pulse (!) 58   Temp 97.7 F (36.5 C) (Temporal)   Ht 5\' 10"  (1.778 m)   Wt 194 lb 9.6 oz (88.3 kg)   SpO2 97%   BMI 27.92 kg/m   BP Readings from Last 3 Encounters:  01/09/19 (!) 156/81  07/08/18 (!) 147/78  01/23/18 (!) 145/67    Wt Readings from Last 3 Encounters:  01/09/19 194 lb 9.6 oz (88.3 kg)  07/08/18 194 lb (88 kg)  01/21/18 190 lb (86.2 kg)     Physical Exam Constitutional:      General: Philip Stone is not in acute distress.    Appearance: Philip Stone is well-developed.  HENT:     Head: Normocephalic and atraumatic.     Right Ear: External ear normal.     Left Ear: External ear normal.     Nose: Nose normal.  Eyes:     Conjunctiva/sclera: Conjunctivae normal.     Pupils: Pupils are equal,  round, and reactive to light.  Neck:     Musculoskeletal: Normal range of motion and neck supple.  Cardiovascular:     Rate and Rhythm: Normal rate and regular rhythm.     Heart sounds: Normal heart sounds. No murmur.  Pulmonary:     Effort: Pulmonary effort is normal. No respiratory distress.     Breath sounds: Normal breath sounds. No wheezing or rales.  Abdominal:     Palpations: Abdomen is soft.     Tenderness: There is no abdominal tenderness.  Musculoskeletal: Normal range of motion.  Skin:    General: Skin is warm and dry.  Neurological:     Mental Status: Philip Stone is alert and  oriented to person, place, and time.     Deep Tendon Reflexes: Reflexes are normal and symmetric.  Psychiatric:        Behavior: Behavior normal.        Thought Content: Thought content normal.        Judgment: Judgment normal.       Assessment & Plan:   Philip Stone was seen today for medical management of chronic issues.  Diagnoses and all orders for this visit:  Gastroesophageal reflux disease with esophagitis without hemorrhage -     CBC -     CMP  Benign prostatic hyperplasia with elevated prostate specific antigen (PSA) -     CBC -     CMP  Dizziness -     CBC -     CMP  Chronic sore throat -     CBC -     CMP -     ENT  Tinea cruris -     CBC -     CMP  Other orders -     silodosin (RAPAFLO) 4 MG CAPS capsule; Take 1 capsule (4 mg total) by mouth daily with breakfast. -     clotrimazole-betamethasone (LOTRISONE) cream; Apply 1 application topically 2 (two) times daily. To affected areas until rash clears       I have discontinued Philip Stone tamsulosin. I am also having him start on silodosin and clotrimazole-betamethasone. Additionally, I am having him maintain his econazole nitrate and pantoprazole.  Allergies as of 01/09/2019      Reactions   Asa [aspirin] Anaphylaxis   Prednisone    Makes him lightheaded      Medication List       Accurate as of January 09, 2019  5:25 PM. If you have any questions, ask your nurse or doctor.        STOP taking these medications   tamsulosin 0.4 MG Caps capsule Commonly known as: FLOMAX Stopped by: Claretta Fraise, MD     TAKE these medications   clotrimazole-betamethasone cream Commonly known as: Lotrisone Apply 1 application topically 2 (two) times daily. To affected areas until rash clears Started by: Claretta Fraise, MD   econazole nitrate 1 % cream Apply topically 2 (two) times daily. To affected areas   pantoprazole 40 MG tablet Commonly known as: PROTONIX TAKE 1 TABLET (40 MG TOTAL) BY MOUTH  DAILY. (NEEDS TO BE SEEN BEFORE NEXT REFILL)   silodosin 4 MG Caps capsule Commonly known as: RAPAFLO Take 1 capsule (4 mg total) by mouth daily with breakfast. Started by: Claretta Fraise, MD        Follow-up: Return in about 6 weeks (around 02/20/2019).  Claretta Fraise, M.D.

## 2019-01-09 NOTE — Patient Instructions (Signed)

## 2019-01-10 ENCOUNTER — Telehealth: Payer: Self-pay | Admitting: Family Medicine

## 2019-01-10 LAB — CMP14+EGFR
ALT: 21 IU/L (ref 0–44)
AST: 28 IU/L (ref 0–40)
Albumin/Globulin Ratio: 1.6 (ref 1.2–2.2)
Albumin: 4.4 g/dL (ref 3.8–4.8)
Alkaline Phosphatase: 113 IU/L (ref 39–117)
BUN/Creatinine Ratio: 12 (ref 10–24)
BUN: 13 mg/dL (ref 8–27)
Bilirubin Total: 0.4 mg/dL (ref 0.0–1.2)
CO2: 20 mmol/L (ref 20–29)
Calcium: 9.3 mg/dL (ref 8.6–10.2)
Chloride: 104 mmol/L (ref 96–106)
Creatinine, Ser: 1.05 mg/dL (ref 0.76–1.27)
GFR calc Af Amer: 85 mL/min/{1.73_m2} (ref >59)
GFR calc non Af Amer: 74 mL/min/{1.73_m2} (ref >59)
Globulin, Total: 2.7 g/dL (ref 1.5–4.5)
Glucose: 102 mg/dL — ABNORMAL HIGH (ref 65–99)
Potassium: 4.8 mmol/L (ref 3.5–5.2)
Sodium: 141 mmol/L (ref 134–144)
Total Protein: 7.1 g/dL (ref 6.0–8.5)

## 2019-01-10 LAB — CBC WITH DIFFERENTIAL/PLATELET
Basophils Absolute: 0 10*3/uL (ref 0.0–0.2)
Basos: 0 %
EOS (ABSOLUTE): 0.1 10*3/uL (ref 0.0–0.4)
Eos: 2 %
Hematocrit: 42.7 % (ref 37.5–51.0)
Hemoglobin: 14.2 g/dL (ref 13.0–17.7)
Lymphocytes Absolute: 1.4 10*3/uL (ref 0.7–3.1)
Lymphs: 24 %
MCH: 29.1 pg (ref 26.6–33.0)
MCHC: 33.3 g/dL (ref 31.5–35.7)
MCV: 88 fL (ref 79–97)
Monocytes Absolute: 0.7 10*3/uL (ref 0.1–0.9)
Monocytes: 13 %
Neutrophils Absolute: 3.5 10*3/uL (ref 1.4–7.0)
Neutrophils: 61 %
Platelets: 112 10*3/uL — ABNORMAL LOW (ref 150–450)
RBC: 4.88 x10E6/uL (ref 4.14–5.80)
RDW: 13 % (ref 11.6–15.4)
WBC: 5.7 10*3/uL (ref 3.4–10.8)

## 2019-01-10 NOTE — Progress Notes (Signed)
Hello Rahsaan, ° °Your lab result is normal and/or stable.Some minor variations that are not significant are commonly marked abnormal, but do not represent any medical problem for you. ° °Best regards, °Kazim Corrales, M.D.

## 2019-01-10 NOTE — Telephone Encounter (Signed)
Line busy

## 2019-01-10 NOTE — Telephone Encounter (Signed)
Spoke with pt - we cleared up the cream questions, BUT  Please address the cost issue for Silodosin. He says this is a tier 4 and his current Flomax is a Tier 1 ( takes 0.8)

## 2019-01-10 NOTE — Telephone Encounter (Signed)
Please contact the patient . The new medication is intended to help with the dizziness. This is likely a side effct of the tamsulosin. If he can not afford the Rapaflo, He should go off of the tamsulosin for two weeks, minimum to verify that the dizziness goes away.

## 2019-01-11 NOTE — Telephone Encounter (Signed)
Pt aware to not start the Rapaflo d/t cost, he will stay of the Tamsulosin for 2 weeks, then call us back with followup

## 2019-02-01 ENCOUNTER — Other Ambulatory Visit: Payer: Self-pay | Admitting: Family Medicine

## 2019-02-01 ENCOUNTER — Telehealth: Payer: Self-pay | Admitting: Family Medicine

## 2019-02-01 MED ORDER — ECONAZOLE NITRATE 1 % EX CREA: TOPICAL_CREAM | Freq: Two times a day (BID) | CUTANEOUS | 5 refills | Status: DC

## 2019-02-01 MED ORDER — TAMSULOSIN HCL 0.4 MG PO CAPS: ORAL_CAPSULE | ORAL | 3 refills | Status: DC

## 2019-02-01 NOTE — Telephone Encounter (Signed)
I sent in the requested prescription 

## 2019-02-01 NOTE — Telephone Encounter (Signed)
Patient aware and verbalized understanding. °

## 2019-02-14 DIAGNOSIS — K219 Gastro-esophageal reflux disease without esophagitis: Secondary | ICD-10-CM | POA: Diagnosis not present

## 2019-02-14 DIAGNOSIS — R070 Pain in throat: Secondary | ICD-10-CM | POA: Diagnosis not present

## 2019-02-21 ENCOUNTER — Ambulatory Visit: Payer: PPO | Admitting: Family Medicine

## 2019-03-05 ENCOUNTER — Other Ambulatory Visit: Payer: Self-pay | Admitting: Family Medicine

## 2019-03-28 ENCOUNTER — Telehealth: Payer: Self-pay | Admitting: Family Medicine

## 2019-03-28 DIAGNOSIS — H6122 Impacted cerumen, left ear: Secondary | ICD-10-CM | POA: Diagnosis not present

## 2019-03-28 DIAGNOSIS — R070 Pain in throat: Secondary | ICD-10-CM | POA: Diagnosis not present

## 2019-03-28 DIAGNOSIS — K219 Gastro-esophageal reflux disease without esophagitis: Secondary | ICD-10-CM | POA: Diagnosis not present

## 2019-03-28 DIAGNOSIS — H9313 Tinnitus, bilateral: Secondary | ICD-10-CM | POA: Diagnosis not present

## 2019-03-28 DIAGNOSIS — H903 Sensorineural hearing loss, bilateral: Secondary | ICD-10-CM | POA: Diagnosis not present

## 2019-03-28 NOTE — Chronic Care Management (AMB) (Signed)
  Chronic Care Management   Note  03/28/2019 Name: Philip Stone MRN: [id number] DOB: 1950/12/16  [name] is a 70 y.o. year old male who is a primary care patient of Stacks, Cletus Gash, MD. I reached out to [name] by phone today in response to a referral sent by [name].     [name] information about Chronic Care Management services today including:  1. CCM service includes personalized support from designated clinical staff supervised by his physician, including individualized plan of care and coordination with other care providers 2. 24/7 contact phone numbers for assistance for urgent and routine care needs. 3. Service will only be billed when office clinical staff spend 20 minutes or more in a month to coordinate care. 4. Only one practitioner may furnish and bill the service in a calendar month. 5. The patient may stop CCM services at any time (effective at the end of the month) by phone call to the office staff. 6. The patient will be responsible for cost sharing (co-pay) of up to 20% of the service fee (after annual deductible is met).  Patient agreed to services and verbal consent obtained.   Follow up plan: Telephone appointment with care management team member scheduled for:04/25/2019  Noreene Larsson, [place], [place], [place] [address] Direct Dial: [phone] [email or web address]: [email or web address]

## 2019-04-25 ENCOUNTER — Ambulatory Visit: Payer: PPO | Admitting: *Deleted

## 2019-04-25 DIAGNOSIS — H9193 Unspecified hearing loss, bilateral: Secondary | ICD-10-CM

## 2019-04-25 DIAGNOSIS — G600 Hereditary motor and sensory neuropathy: Secondary | ICD-10-CM

## 2019-04-25 NOTE — Chronic Care Management (AMB) (Signed)
  Care Management   Initial Visit Note  04/25/2019 Name: Philip Stone MRN: [id number] DOB: [birth year]  Referred by: Claretta Fraise, MD Reason for referral : Chronic Care Management (RN Initial Visit)   Philip Stone is a 69 y.o. year old male who is a primary care patient of Stacks, Cletus Gash, MD who was referred by his health plan to the CCM team for assistance with chronic disease management and care coordination needs.   I spoke with Philip Stone by telephone today and did a thorough chart review. He feels that he is managing his medical conditions well on his own. He does not have two or more CCM qualifying chronic medical conditions, and therefore is ineligible for CCM services. I did place a referral for the Care Guide to provide assistance with hearing aid acquisition and I provided Philip Stone with my direct contact number in case he needs assistance with any nursing care management issues.   SDOH (Social Determinants of Health) assessments performed: Yes See Care Plan activities for detailed interventions related to SDOH)     Medications: Outpatient Encounter Medications as of 04/25/2019  Medication Sig  . econazole nitrate 1 % cream Apply topically 2 (two) times daily. To affected areas  . famotidine (PEPCID) 20 MG tablet Take 20 mg by mouth at bedtime.  . pantoprazole (PROTONIX) 40 MG tablet TAKE 1 TABLET (40 MG TOTAL) BY MOUTH DAILY. (NEEDS TO BE SEEN BEFORE NEXT REFILL)  . tamsulosin (FLOMAX) 0.4 MG CAPS capsule TAKE TWO CAPSULES BY MOUTH DAILY AT BEDTIME   No facility-administered encounter medications on file as of 04/25/2019.    RN Care Plan   . Obtain Hearing Aid       CARE PLAN ENTRY (see longtitudinal plan of care for additional care plan information)  Current Barriers:  . Chronic Disease Management support and education needs related to hearing loss . Knowledge deficits related to how to obtain cost effective hearing aids  Nurse Case Manager Clinical Goal(s):  Marland Kitchen Over the  next 30 days, patient will work with Care Guide to address needs related to obtaining cost effective hearing aids  Interventions:  . Discussed HPI and recent hearing test o Has one hearing aid and was recommended to purchase one for the second ear o Has not bought one due to cost . Referral to Care Guide to research cost effective options  Patient Self Care Activities:  . Performs ADL's independently . Performs IADL's independently  Initial goal documentation         Plan:  CCM team will reach out again over the next 45 days to follow-up on Care Guide referral Patient provided with CCM contact information and encouraged to reach out as needed CCM team will remain available to provide CCM services if the patient becomes eligible  Chong Sicilian, BSN, RN-BC [place] [facility] [place] Management Direct Dial: [phone]

## 2019-04-25 NOTE — Patient Instructions (Signed)
  Plan:  CCM team will reach out again over the next 45 days to follow-up on Care Guide referral Patient provided with CCM contact information and encouraged to reach out as needed CCM team will remain available to provide CCM services if the patient becomes eligible  Chong Sicilian, BSN, RN-BC [place] [facility] [place] [facility] Direct Dial: [phone]

## 2019-06-01 ENCOUNTER — Other Ambulatory Visit: Payer: Self-pay | Admitting: Family Medicine

## 2019-06-24 ENCOUNTER — Other Ambulatory Visit: Payer: Self-pay | Admitting: Family Medicine

## 2019-06-26 NOTE — Telephone Encounter (Signed)
Stacks. NTBS 30 days given 06/01/19

## 2019-06-26 NOTE — Telephone Encounter (Signed)
Pt scheduled with Dr Livia Snellen for follow up on 07/17/19 at 3:25. Pt states he has enough medications till then and doesn't need a refill now.

## 2019-06-27 DIAGNOSIS — K219 Gastro-esophageal reflux disease without esophagitis: Secondary | ICD-10-CM | POA: Diagnosis not present

## 2019-06-27 DIAGNOSIS — R070 Pain in throat: Secondary | ICD-10-CM | POA: Diagnosis not present

## 2019-07-17 ENCOUNTER — Ambulatory Visit (INDEPENDENT_AMBULATORY_CARE_PROVIDER_SITE_OTHER): Payer: PPO | Admitting: Family Medicine

## 2019-07-17 ENCOUNTER — Encounter: Payer: Self-pay | Admitting: Family Medicine

## 2019-07-17 ENCOUNTER — Other Ambulatory Visit: Payer: Self-pay

## 2019-07-17 VITALS — BP 132/79 | HR 61 | Temp 97.7°F | Resp 20 | Ht 70.0 in | Wt 194.5 lb

## 2019-07-17 DIAGNOSIS — Z1159 Encounter for screening for other viral diseases: Secondary | ICD-10-CM

## 2019-07-17 DIAGNOSIS — N400 Enlarged prostate without lower urinary tract symptoms: Secondary | ICD-10-CM

## 2019-07-17 DIAGNOSIS — Z1211 Encounter for screening for malignant neoplasm of colon: Secondary | ICD-10-CM

## 2019-07-17 DIAGNOSIS — D485 Neoplasm of uncertain behavior of skin: Secondary | ICD-10-CM

## 2019-07-17 DIAGNOSIS — R972 Elevated prostate specific antigen [PSA]: Secondary | ICD-10-CM

## 2019-07-17 DIAGNOSIS — K2100 Gastro-esophageal reflux disease with esophagitis, without bleeding: Secondary | ICD-10-CM | POA: Diagnosis not present

## 2019-07-17 NOTE — Progress Notes (Signed)
Subjective:  Patient ID: Philip Stone, male    DOB: [birth year]  Age: 69 y.o. MRN: [id number]  CC: Medication Refill   HPI Philip Stone presents for Patient in for follow-up of GERD. Currently asymptomatic taking  PPI daily. There is no chest pain or heartburn. No hematemesis and no melena. No dysphagia or choking. Onset is remote. Progression is stable. Complicating factors, none.  Lesion at left upper back. Catches of things. Gets irritated. Would like it removed  Patient also is under treatment for BPH.  He is followed by urology taking his tamsulosin without adequate relief and considering TURP this summer.    Depression screen [facility] 2/9 07/17/2019 04/25/2019 07/08/2018  Decreased Interest 0 0 0  Down, Depressed, Hopeless 0 0 0  PHQ - 2 Score 0 0 0  Altered sleeping - - 0  Tired, decreased energy - - 0  Change in appetite - - 0  Feeling bad or failure about yourself  - - 0  Trouble concentrating - - 0  Moving slowly or fidgety/restless - - 0  Suicidal thoughts - - 0  PHQ-9 Score - - 0    History Philip Stone has a past medical history of Arthritis, BPH (benign prostatic hyperplasia), Charcot-Marie-Tooth disease, Foot drop, right, Neuropathy, Platelets decreased ([place]), and Spinal stenosis.   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   His family history includes Other in his father; Suicidality in his mother.He reports that he quit smoking about 13 years ago. His smoking use included cigarettes. He started smoking about 30 years ago. He has a 18.00 pack-year smoking history. He has never used smokeless tobacco. He reports that he does not drink alcohol or use drugs.    ROS Review of Systems  Constitutional: Negative for fever.  Respiratory: Negative for shortness of  breath.   Cardiovascular: Negative for chest pain.  Musculoskeletal: Negative for arthralgias.  Skin: Negative for rash.    Objective:  BP 132/79   Pulse 61   Temp 97.7 F (36.5 C) (Temporal)   Resp 20   Ht '5\' 10"'  (1.778 m)   Wt 194 lb 8 oz (88.2 kg)   SpO2 98%   BMI 27.91 kg/m   BP Readings from Last 3 Encounters:  07/17/19 132/79  01/09/19 (!) 156/81  07/08/18 (!) 147/78    Wt Readings from Last 3 Encounters:  07/17/19 194 lb 8 oz (88.2 kg)  01/09/19 194 lb 9.6 oz (88.3 kg)  07/08/18 194 lb (88 kg)     Physical Exam Vitals reviewed.  Constitutional:      Appearance: He is well-developed.  HENT:     Head: Normocephalic and atraumatic.     Right Ear: Tympanic membrane and external ear normal. No decreased hearing noted.     Left Ear: Tympanic membrane and external ear normal. No decreased hearing noted.     Mouth/Throat:     Pharynx: No oropharyngeal exudate or posterior oropharyngeal erythema.  Eyes:     Pupils: Pupils are equal, round, and reactive to light.  Cardiovascular:     Rate and Rhythm: Normal rate and regular rhythm.     Heart sounds: No murmur.  Pulmonary:     Effort: No respiratory distress.     Breath sounds: Normal breath sounds.  Abdominal:     General: Bowel sounds are normal.  Palpations: Abdomen is soft. There is no mass.     Tenderness: There is no abdominal tenderness.  Musculoskeletal:     Cervical back: Normal range of motion and neck supple.  Skin:    Findings: Lesion (There is a 1 cm fleshy pink pedunculated mole at the upper lateral aspect of the scapula on the left) present.       Assessment & Plan:   Philip Stone was seen today for medication refill.  Diagnoses and all orders for this visit:  Neoplasm of uncertain behavior of skin  Gastroesophageal reflux disease with esophagitis without hemorrhage -     CBC with Differential/Platelet -     CMP14+EGFR -     Lipid panel -     Hepatitis C antibody  Benign prostatic  hyperplasia with elevated prostate specific antigen (PSA) -     CBC with Differential/Platelet -     CMP14+EGFR -     Lipid panel -     Hepatitis C antibody  Need for hepatitis C screening test -     Hepatitis C antibody  Screen for colon cancer -     Ambulatory referral to Gastroenterology       I have discontinued Philip Stone's famotidine. I am also having him maintain his econazole nitrate, tamsulosin, and pantoprazole.  Allergies as of 07/17/2019      Reactions   Asa [aspirin] Anaphylaxis   Prednisone    Makes him lightheaded      Medication List       Accurate as of July 17, 2019 10:31 PM. If you have any questions, ask your nurse or doctor.        STOP taking these medications   famotidine 20 MG tablet Commonly known as: PEPCID Stopped by: Claretta Fraise, MD     TAKE these medications   econazole nitrate 1 % cream Apply topically 2 (two) times daily. To affected areas   pantoprazole 40 MG tablet Commonly known as: PROTONIX TAKE 1 TABLET (40 MG TOTAL) BY MOUTH DAILY. (NEEDS TO BE SEEN BEFORE NEXT REFILL)   tamsulosin 0.4 MG Caps capsule Commonly known as: FLOMAX TAKE TWO CAPSULES BY MOUTH DAILY AT BEDTIME      He was advised to schedule a procedure to have the mole removed.  He is also been referred for colonoscopy.  Follow-up: Return in about 6 months (around 01/16/2020).  Claretta Fraise, M.D.

## 2019-07-18 LAB — CBC WITH DIFFERENTIAL/PLATELET
Basophils Absolute: 0 10*3/uL (ref 0.0–0.2)
Basos: 1 %
EOS (ABSOLUTE): 0.1 10*3/uL (ref 0.0–0.4)
Eos: 2 %
Hematocrit: 40.4 % (ref 37.5–51.0)
Hemoglobin: 13.5 g/dL (ref 13.0–17.7)
Immature Grans (Abs): 0 10*3/uL (ref 0.0–0.1)
Immature Granulocytes: 0 %
Lymphocytes Absolute: 1.3 10*3/uL (ref 0.7–3.1)
Lymphs: 23 %
MCH: 29.4 pg (ref 26.6–33.0)
MCHC: 33.4 g/dL (ref 31.5–35.7)
MCV: 88 fL (ref 79–97)
Monocytes Absolute: 0.7 10*3/uL (ref 0.1–0.9)
Monocytes: 12 %
Neutrophils Absolute: 3.7 10*3/uL (ref 1.4–7.0)
Neutrophils: 62 %
Platelets: 117 10*3/uL — ABNORMAL LOW (ref 150–450)
RBC: 4.59 x10E6/uL (ref 4.14–5.80)
RDW: 13.5 % (ref 11.6–15.4)
WBC: 5.9 10*3/uL (ref 3.4–10.8)

## 2019-07-18 LAB — CMP14+EGFR
ALT: 17 IU/L (ref 0–44)
AST: 23 IU/L (ref 0–40)
Albumin/Globulin Ratio: 1.7 (ref 1.2–2.2)
Albumin: 4.1 g/dL (ref 3.8–4.8)
Alkaline Phosphatase: 99 IU/L (ref 48–121)
BUN/Creatinine Ratio: 14 (ref 10–24)
BUN: 14 mg/dL (ref 8–27)
Bilirubin Total: 0.4 mg/dL (ref 0.0–1.2)
CO2: 23 mmol/L (ref 20–29)
Calcium: 9 mg/dL (ref 8.6–10.2)
Chloride: 105 mmol/L (ref 96–106)
Creatinine, Ser: 1 mg/dL (ref 0.76–1.27)
GFR calc Af Amer: 90 mL/min/{1.73_m2} (ref >59)
GFR calc non Af Amer: 78 mL/min/{1.73_m2} (ref >59)
Globulin, Total: 2.4 g/dL (ref 1.5–4.5)
Glucose: 94 mg/dL (ref 65–99)
Potassium: 4.3 mmol/L (ref 3.5–5.2)
Sodium: 139 mmol/L (ref 134–144)
Total Protein: 6.5 g/dL (ref 6.0–8.5)

## 2019-07-18 LAB — HEPATITIS C ANTIBODY: Hep C Virus Ab: >11 s/co ratio — ABNORMAL HIGH (ref 0.0–0.9)

## 2019-07-18 LAB — LIPID PANEL
Chol/HDL Ratio: 4.3 ratio (ref 0.0–5.0)
Cholesterol, Total: 181 mg/dL (ref 100–199)
HDL: 42 mg/dL (ref >39)
LDL Chol Calc (NIH): 107 mg/dL — ABNORMAL HIGH (ref 0–99)
Triglycerides: 186 mg/dL — ABNORMAL HIGH (ref 0–149)
VLDL Cholesterol Cal: 32 mg/dL (ref 5–40)

## 2019-07-20 ENCOUNTER — Encounter: Payer: Self-pay | Admitting: Internal Medicine

## 2019-07-20 LAB — HEPATITIS C VRS RNA DETECT BY PCR-QUAL: HCV RNA NAA Qualitative: NEGATIVE

## 2019-07-20 LAB — SPECIMEN STATUS REPORT

## 2019-07-28 NOTE — Progress Notes (Signed)
Hello Philip Stone, ° °Your lab result is normal and/or stable.Some minor variations that are not significant are commonly marked abnormal, but do not represent any medical problem for you. ° °Best regards, °Vannary Greening, M.D.

## 2019-08-03 ENCOUNTER — Ambulatory Visit: Payer: PPO | Admitting: Family Medicine

## 2019-08-09 ENCOUNTER — Ambulatory Visit (INDEPENDENT_AMBULATORY_CARE_PROVIDER_SITE_OTHER): Payer: PPO | Admitting: Family Medicine

## 2019-08-09 ENCOUNTER — Other Ambulatory Visit: Payer: Self-pay

## 2019-08-09 ENCOUNTER — Other Ambulatory Visit (HOSPITAL_COMMUNITY)
Admission: RE | Admit: 2019-08-09 | Discharge: 2019-08-09 | Disposition: A | Discharge status: Home / Self Care | Payer: PPO | Source: Ambulatory Visit | Attending: Family Medicine | Admitting: Family Medicine

## 2019-08-09 ENCOUNTER — Encounter: Payer: Self-pay | Admitting: Family Medicine

## 2019-08-09 VITALS — BP 133/79 | HR 86 | Temp 98.2°F | Ht 70.0 in | Wt 190.8 lb

## 2019-08-09 DIAGNOSIS — D229 Melanocytic nevi, unspecified: Secondary | ICD-10-CM

## 2019-08-09 DIAGNOSIS — D235 Other benign neoplasm of skin of trunk: Secondary | ICD-10-CM | POA: Diagnosis not present

## 2019-08-11 ENCOUNTER — Encounter: Payer: Self-pay | Admitting: Family Medicine

## 2019-08-11 NOTE — Progress Notes (Signed)
Patient seen today for removal of a large mole from the shoulder blade region on the left side of his back.  This sticks out and rubs on his clothing and other objects and becomes inflamed and painful frequently.  He is in today to have that removed.  Objective: There is a fibromatous gray used lesion with mild inflammation at the edges.  It is pedunculated.  It measures 8 mm and extends from the skin by 1 cm.  Procedure: The patient was prepped and draped in sterile fashion.  Local with 2% lidocaine with epi at the base of the lesion.  This was subsequently shaved and the specimen collected for pathology purposes.  The wound was cauterized for hemostasis using silver nitrate.  Simple dressing was applied.  Plan: Wound care was reviewed with the patient.  After 48 hours he can clean it with warm soapy water apply Neosporin  and a simple Band-Aid dressing  Enlarged skin mole - Plan: Surgical pathology  Wound care reviewed.  Follow-up as needed should signs and symptoms of infection occur.  Claretta Fraise, MD

## 2019-08-14 ENCOUNTER — Other Ambulatory Visit: Payer: Self-pay | Admitting: Family Medicine

## 2019-08-15 LAB — SURGICAL PATHOLOGY

## 2019-09-27 ENCOUNTER — Ambulatory Visit: Payer: PPO

## 2019-11-06 ENCOUNTER — Other Ambulatory Visit: Payer: Self-pay | Admitting: Family Medicine

## 2019-11-06 ENCOUNTER — Ambulatory Visit (INDEPENDENT_AMBULATORY_CARE_PROVIDER_SITE_OTHER): Payer: Self-pay | Admitting: *Deleted

## 2019-11-06 ENCOUNTER — Other Ambulatory Visit: Payer: Self-pay

## 2019-11-06 ENCOUNTER — Telehealth: Payer: Self-pay | Admitting: Family Medicine

## 2019-11-06 VITALS — Ht 70.0 in | Wt 191.4 lb

## 2019-11-06 DIAGNOSIS — Z1211 Encounter for screening for malignant neoplasm of colon: Secondary | ICD-10-CM

## 2019-11-06 MED ORDER — PANTOPRAZOLE SODIUM 40 MG PO TBEC: 40.0000 mg | DELAYED_RELEASE_TABLET | Freq: Every day | ORAL | 1 refills | Status: DC

## 2019-11-06 NOTE — Progress Notes (Signed)
Gastroenterology Pre-Procedure Review  Request Date: 11/06/2019 Requesting Physician: Dr. Livia Snellen @ [facility], Last TCS done 10 years or more in [place], could not remember who did procedure or where exactly it was done, non cancerous polyps per pt  PATIENT REVIEW QUESTIONS: The patient responded to the following health history questions as indicated:    1. Diabetes Melitis: no 2. Joint replacements in the past 12 months: no 3. Major health problems in the past 3 months: no 4. Has an artificial valve or MVP: no 5. Has a defibrillator: no 6. Has been advised in past to take antibiotics in advance of a procedure like teeth cleaning: no 7. Family history of colon cancer: no  8. Alcohol Use: no 9. Illicit drug Use: no 10. History of sleep apnea: no 11. History of coronary artery or other vascular stents placed within the last 12 months: no 12. History of any prior anesthesia complications: no 13. Body mass index is 27.46 kg/m.    MEDICATIONS & ALLERGIES:    Patient reports the following regarding taking any blood thinners:   Plavix? no Aspirin? no Coumadin? no Brilinta? no Xarelto? no Eliquis? no Pradaxa? no Savaysa? no Effient? no  Patient confirms/reports the following medications:  Current Outpatient Medications  Medication Sig Dispense Refill  . pantoprazole (PROTONIX) 40 MG tablet Take 1 tablet (40 mg total) by mouth daily. 90 tablet 1  . tamsulosin (FLOMAX) 0.4 MG CAPS capsule TAKE TWO CAPSULES BY MOUTH DAILY AT BEDTIME 180 capsule 0   No current facility-administered medications for this visit.    Patient confirms/reports the following allergies:  Allergies  Allergen Reactions  . Asa [Aspirin] Anaphylaxis  . Prednisone     Makes him lightheaded    No orders of the defined types were placed in this encounter.   AUTHORIZATION INFORMATION Primary Insurance: [employer],  ID #: [id number] Pre-Cert / Josem Kaufmann required: No, not required  SCHEDULE  INFORMATION: Procedure has been scheduled as follows:  Date: 11/27/2019, Time: 1:45 Location: [facility] with Dr. Abbey Chatters  This Gastroenterology Pre-Precedure Review Form is being routed to the following provider(s): Roseanne Kaufman, NP

## 2019-11-06 NOTE — Progress Notes (Signed)
Pt wanted [place] to know that his PSA level is at 7.  He said it has been like that for 2 years now.

## 2019-11-06 NOTE — Patient Instructions (Addendum)
[place]   Please notify us immediately if you are diabetic, take iron supplements, or if you are on coumadin or any blood thinners.   Patient Name: Philip Stone Date of procedure: 11/27/2019 Time to register at [facility] Stay:  12:15 pm Provider: Dr. Abbey Chatters   Purchase: MIRALAX 238 gram bottle, 1 FLEET ENEMA, 1 box of DULCOLAX (All over the counter medications)    11/25/2019- 2 Days prior to procedure: START CLEAR LIQUID DIET AFTER YOUR LUNCH MEAL--NO SOLID FOODS!   11/26/2019- 1 Day prior to procedure:   CLEAR LIQUIDS ALL DAY--NO SOLID FOODS!   Diabetic medication adjustments for today:    At 10:00 AM, take 2 DULCOLAX 62m tablets   At 12:00 PM, Mix 5 teaspoons of Miralax in any 4-6 ounces of CLEAR LIQUIDS (Gatorade) every hour for 5 hours until passing clear, watery stools. Be sure to drink 4 ounces of clear liquid 30 minutes after each dose of Miralax.   At 3:00 PM, take 2 Dulcolax 559mtablets   If stools are not clear & watery by 6:00 PM, take 5 teaspoons of Miralax every 30 minutes until stools are clear (no color)   You must have a complete prep to ensure the most effective cleaning.   CONTINUE CLEAR LIQUIDS ONLY UNTIL 10:45 am the morning of procedure 11/27/2019. Make a conscious effort to drink as much as you can before, during & after the preparation.    Do not take any medications except for your heart, blood pressure & breathing medications. You may take them with a sip of clear liquids.    11/27/2019- Day of Procedure  Give yourself one Fleet enema about 1 hour prior to leaving for the hospital.   Diabetic medication adjustments for today:   You may take TYLENOL products. Please continue your regular medications unless we have instructed you otherwise.    Please note, on the day of your procedure you MUST be accompanied by an adult who is willing to assume responsibility for you at time of discharge. If you do not have such  person with you, your procedure will have to be rescheduled.                                                             Please leave ALL jewelry at home prior to coming to the hospital for your procedure.   *It is your responsibility to check with your insurance company for the benefits of coverage you have for this procedure. Unfortunately, not all insurance companies have benefits to cover all or part of these types of procedures. It is your responsibility to check your benefits, however we will be glad to assist you with any codes your insurance company may need.   Please note that most insurance companies will not cover a screening colonoscopy for people under the age of 5067For example, with some insurance companies you may have benefits for a screening colonoscopy, but if polyps are found the diagnosis will change and then you may have a deductible that will need to be met. Please make sure you check your benefits for screening colonoscopy as well as a diagnostic colonoscopy.    CLEAR LIQUIDS: (NO RED)  Jello Apple Juice White Grape Juice Water  Banana popsicles Kool-Aid Coffee(No  cream or milk)  Tea (No cream or milk) Soft drinks Broth (fat free beef/chicken/vegetable)   Clear liquids allow you to see your fingers on the other side of the glass. Be sure they are NOT RED in color, cloudy, but CLEAR.   Do Not Eat:  Dairy products of any kind Cranberry juice  Tomato or V8 Juice Orange Juice  Grapefruit Juice Red Grape Juice  Solid foods like cereal, oatmeal, yogurt, fruits, vegetables, creamed soups, eggs, bread, etc   HELPFUL HINTS TO MAKE DRINKING EASIER:  -Make sure prep is extremely COLD. Refrigerate the night before. You may also put in freezer.  -You may try mixing Crystal Light or Country Time Lemonade if you prefer. MIx in small amounts. Add more if necessary.  -Trying drinking through a straw.  -Rinse mouth with  water or mouthwash between glasses to remove aftertaste.  -Try sipping on a cold beverage/ice popsicles between glasses of prep.  -Place a piece of sugar-free hard candy in mouth between glasses.  -If you become nauseated, try consuming smaller amounts or stretch out the time between glasses. Stop for 30 minutes to an hour & slowly start back drinking.   Call our office with any questions or concerns at [phone].   Thank You

## 2019-11-06 NOTE — Telephone Encounter (Signed)
°  Prescription Request  11/06/2019  What is the name of the medication or equipment? pantoprazole (PROTONIX) 40 MG tablet    Have you contacted your pharmacy to request a refill? (if applicable) yes pharmacy told pt that he can not refill until 01/04/20 but he has about 10 left  Which pharmacy would you like this sent to? [facility]    Patient notified that their request is being sent to the clinical staff for review and that they should receive a response within 2 business days.

## 2019-11-06 NOTE — Telephone Encounter (Signed)
Refill sent for pt   [facility]    pt aware can pick up

## 2019-11-08 DIAGNOSIS — N5201 Erectile dysfunction due to arterial insufficiency: Secondary | ICD-10-CM | POA: Diagnosis not present

## 2019-11-08 DIAGNOSIS — R3912 Poor urinary stream: Secondary | ICD-10-CM | POA: Diagnosis not present

## 2019-11-08 DIAGNOSIS — R972 Elevated prostate specific antigen [PSA]: Secondary | ICD-10-CM | POA: Diagnosis not present

## 2019-11-08 DIAGNOSIS — N401 Enlarged prostate with lower urinary tract symptoms: Secondary | ICD-10-CM | POA: Diagnosis not present

## 2019-11-14 ENCOUNTER — Emergency Department (HOSPITAL_COMMUNITY): Payer: PPO

## 2019-11-14 ENCOUNTER — Other Ambulatory Visit: Payer: Self-pay

## 2019-11-14 ENCOUNTER — Emergency Department (HOSPITAL_COMMUNITY)
Admission: EM | Admit: 2019-11-14 | Discharge: 2019-11-14 | Disposition: A | Discharge status: Home / Self Care | Payer: PPO | Source: Home / Self Care | Attending: Emergency Medicine | Admitting: Emergency Medicine

## 2019-11-14 ENCOUNTER — Encounter (HOSPITAL_COMMUNITY): Payer: Self-pay

## 2019-11-14 ENCOUNTER — Telehealth: Payer: Self-pay | Admitting: *Deleted

## 2019-11-14 DIAGNOSIS — Z79899 Other long term (current) drug therapy: Secondary | ICD-10-CM | POA: Diagnosis not present

## 2019-11-14 DIAGNOSIS — G8191 Hemiplegia, unspecified affecting right dominant side: Secondary | ICD-10-CM | POA: Diagnosis not present

## 2019-11-14 DIAGNOSIS — S80212A Abrasion, left knee, initial encounter: Secondary | ICD-10-CM | POA: Diagnosis not present

## 2019-11-14 DIAGNOSIS — R4701 Aphasia: Secondary | ICD-10-CM | POA: Diagnosis not present

## 2019-11-14 DIAGNOSIS — H53461 Homonymous bilateral field defects, right side: Secondary | ICD-10-CM | POA: Diagnosis not present

## 2019-11-14 DIAGNOSIS — E785 Hyperlipidemia, unspecified: Secondary | ICD-10-CM | POA: Diagnosis not present

## 2019-11-14 DIAGNOSIS — K089 Disorder of teeth and supporting structures, unspecified: Secondary | ICD-10-CM | POA: Diagnosis not present

## 2019-11-14 DIAGNOSIS — K5901 Slow transit constipation: Secondary | ICD-10-CM | POA: Diagnosis not present

## 2019-11-14 DIAGNOSIS — Z20822 Contact with and (suspected) exposure to covid-19: Secondary | ICD-10-CM | POA: Diagnosis not present

## 2019-11-14 DIAGNOSIS — Z87891 Personal history of nicotine dependence: Secondary | ICD-10-CM | POA: Insufficient documentation

## 2019-11-14 DIAGNOSIS — K219 Gastro-esophageal reflux disease without esophagitis: Secondary | ICD-10-CM | POA: Diagnosis not present

## 2019-11-14 DIAGNOSIS — R482 Apraxia: Secondary | ICD-10-CM | POA: Diagnosis not present

## 2019-11-14 DIAGNOSIS — I6622 Occlusion and stenosis of left posterior cerebral artery: Secondary | ICD-10-CM | POA: Diagnosis not present

## 2019-11-14 DIAGNOSIS — I6389 Other cerebral infarction: Secondary | ICD-10-CM | POA: Diagnosis not present

## 2019-11-14 DIAGNOSIS — N400 Enlarged prostate without lower urinary tract symptoms: Secondary | ICD-10-CM | POA: Diagnosis not present

## 2019-11-14 DIAGNOSIS — G600 Hereditary motor and sensory neuropathy: Secondary | ICD-10-CM | POA: Diagnosis not present

## 2019-11-14 DIAGNOSIS — N401 Enlarged prostate with lower urinary tract symptoms: Secondary | ICD-10-CM | POA: Diagnosis not present

## 2019-11-14 DIAGNOSIS — Z888 Allergy status to other drugs, medicaments and biological substances status: Secondary | ICD-10-CM | POA: Diagnosis not present

## 2019-11-14 DIAGNOSIS — Z886 Allergy status to analgesic agent status: Secondary | ICD-10-CM | POA: Diagnosis not present

## 2019-11-14 DIAGNOSIS — Z818 Family history of other mental and behavioral disorders: Secondary | ICD-10-CM | POA: Diagnosis not present

## 2019-11-14 DIAGNOSIS — R338 Other retention of urine: Secondary | ICD-10-CM | POA: Diagnosis not present

## 2019-11-14 DIAGNOSIS — I69393 Ataxia following cerebral infarction: Secondary | ICD-10-CM | POA: Diagnosis not present

## 2019-11-14 DIAGNOSIS — H5340 Unspecified visual field defects: Secondary | ICD-10-CM | POA: Diagnosis not present

## 2019-11-14 DIAGNOSIS — I6522 Occlusion and stenosis of left carotid artery: Secondary | ICD-10-CM | POA: Diagnosis not present

## 2019-11-14 DIAGNOSIS — R200 Anesthesia of skin: Secondary | ICD-10-CM

## 2019-11-14 DIAGNOSIS — M21371 Foot drop, right foot: Secondary | ICD-10-CM | POA: Diagnosis not present

## 2019-11-14 DIAGNOSIS — H538 Other visual disturbances: Secondary | ICD-10-CM | POA: Insufficient documentation

## 2019-11-14 DIAGNOSIS — I63532 Cerebral infarction due to unspecified occlusion or stenosis of left posterior cerebral artery: Secondary | ICD-10-CM | POA: Diagnosis not present

## 2019-11-14 DIAGNOSIS — M1990 Unspecified osteoarthritis, unspecified site: Secondary | ICD-10-CM | POA: Diagnosis not present

## 2019-11-14 DIAGNOSIS — I639 Cerebral infarction, unspecified: Secondary | ICD-10-CM | POA: Diagnosis not present

## 2019-11-14 DIAGNOSIS — M4802 Spinal stenosis, cervical region: Secondary | ICD-10-CM | POA: Diagnosis not present

## 2019-11-14 DIAGNOSIS — G63 Polyneuropathy in diseases classified elsewhere: Secondary | ICD-10-CM | POA: Diagnosis not present

## 2019-11-14 DIAGNOSIS — I6529 Occlusion and stenosis of unspecified carotid artery: Secondary | ICD-10-CM | POA: Diagnosis not present

## 2019-11-14 DIAGNOSIS — Z96651 Presence of right artificial knee joint: Secondary | ICD-10-CM | POA: Insufficient documentation

## 2019-11-14 DIAGNOSIS — I69320 Aphasia following cerebral infarction: Secondary | ICD-10-CM | POA: Diagnosis not present

## 2019-11-14 DIAGNOSIS — R42 Dizziness and giddiness: Secondary | ICD-10-CM | POA: Diagnosis not present

## 2019-11-14 DIAGNOSIS — D696 Thrombocytopenia, unspecified: Secondary | ICD-10-CM | POA: Diagnosis not present

## 2019-11-14 DIAGNOSIS — I69351 Hemiplegia and hemiparesis following cerebral infarction affecting right dominant side: Secondary | ICD-10-CM | POA: Diagnosis not present

## 2019-11-14 DIAGNOSIS — R488 Other symbolic dysfunctions: Secondary | ICD-10-CM | POA: Diagnosis not present

## 2019-11-14 DIAGNOSIS — E876 Hypokalemia: Secondary | ICD-10-CM | POA: Diagnosis not present

## 2019-11-14 DIAGNOSIS — R001 Bradycardia, unspecified: Secondary | ICD-10-CM | POA: Diagnosis not present

## 2019-11-14 DIAGNOSIS — I69390 Apraxia following cerebral infarction: Secondary | ICD-10-CM | POA: Diagnosis not present

## 2019-11-14 DIAGNOSIS — R030 Elevated blood-pressure reading, without diagnosis of hypertension: Secondary | ICD-10-CM | POA: Diagnosis not present

## 2019-11-14 LAB — CBC WITH DIFFERENTIAL/PLATELET
Abs Immature Granulocytes: 0.02 10*3/uL (ref 0.00–0.07)
Basophils Absolute: 0 10*3/uL (ref 0.0–0.1)
Basophils Relative: 0 %
Eosinophils Absolute: 0.1 10*3/uL (ref 0.0–0.5)
Eosinophils Relative: 3 %
HCT: 42.6 % (ref 39.0–52.0)
Hemoglobin: 14.8 g/dL (ref 13.0–17.0)
Immature Granulocytes: 0 %
Lymphocytes Relative: 28 %
Lymphs Abs: 1.4 10*3/uL (ref 0.7–4.0)
MCH: 29.8 pg (ref 26.0–34.0)
MCHC: 34.7 g/dL (ref 30.0–36.0)
MCV: 85.9 fL (ref 80.0–100.0)
Monocytes Absolute: 0.6 10*3/uL (ref 0.1–1.0)
Monocytes Relative: 11 %
Neutro Abs: 2.9 10*3/uL (ref 1.7–7.7)
Neutrophils Relative %: 58 %
Platelets: 121 10*3/uL — ABNORMAL LOW (ref 150–400)
RBC: 4.96 MIL/uL (ref 4.22–5.81)
RDW: 13.4 % (ref 11.5–15.5)
WBC: 4.9 10*3/uL (ref 4.0–10.5)
nRBC: 0 % (ref 0.0–0.2)

## 2019-11-14 LAB — COMPREHENSIVE METABOLIC PANEL
ALT: 24 U/L (ref 0–44)
AST: 24 U/L (ref 15–41)
Albumin: 4.2 g/dL (ref 3.5–5.0)
Alkaline Phosphatase: 77 U/L (ref 38–126)
Anion gap: 7 (ref 5–15)
BUN: 13 mg/dL (ref 8–23)
CO2: 26 mmol/L (ref 22–32)
Calcium: 9.2 mg/dL (ref 8.9–10.3)
Chloride: 104 mmol/L (ref 98–111)
Creatinine, Ser: 0.88 mg/dL (ref 0.61–1.24)
GFR calc non Af Amer: >60 mL/min (ref >60)
Glucose, Bld: 93 mg/dL (ref 70–99)
Potassium: 3.9 mmol/L (ref 3.5–5.1)
Sodium: 137 mmol/L (ref 135–145)
Total Bilirubin: 0.9 mg/dL (ref 0.3–1.2)
Total Protein: 7.1 g/dL (ref 6.5–8.1)

## 2019-11-14 LAB — RAPID HIV SCREEN (HIV 1/2 AB+AG)
HIV 1/2 Antibodies: NONREACTIVE
HIV-1 P24 Antigen - HIV24: NONREACTIVE

## 2019-11-14 NOTE — ED Triage Notes (Signed)
Pt presents to ED with complaints of dizziness, lightheadedness, right sided facial numbess, right sided blurry vision x 3 days. Pt states it comes and goes x last 3 days, but woke up this morning and was a little worse.

## 2019-11-14 NOTE — Discharge Instructions (Addendum)
Philip Stone, it is a pleasure getting to know you today.  You came the ED for right face numbness and blurry vision of right eye.  All your lab works came back unremarkable.  MRI of your brain is negative for stroke, bleeding, or mass.  MRA of the brain reveals a narrowing of the posterior cerebral artery.  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  Please follow-up with your neurologist at [facility] for further testing.  Also follow-up with an eye doctor.  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  Please come back to the ED if you experience facial drooping, slurred speech, weakness or worsened visual disturbances.

## 2019-11-14 NOTE — Telephone Encounter (Signed)
Patient called office this morning complaining of unilateral facial numbness, blurred vision, and ringing in his ears. Advised ER for eval. Patient agreeable.

## 2019-11-14 NOTE — ED Provider Notes (Signed)
[facility] EMERGENCY DEPARTMENT Provider Note   CSN: [id number] Arrival date & time: 11/14/19  0174     History Chief Complaint  Patient presents with  . Dizziness    Philip Stone is a 69 y.o. male with past medical history of Charcot-Marie-Tooth disease, right foot drop, high arched feet, neuropathy, cervical stenosis status post decompression, who is here for chief complaint of right sided facial numbness with blurry vision right eye.  Patient states that symptoms started about 3 days ago.  He complains of feeling numb on the right side of his face that comes and goes, nothing makes it better or worse.  He also complains of intermittent visual blurriness of the right eye.  He denies facial drooping, new onset of weakness, problem eating or breathing.  He also complains of mild chest pain that relieved with burping.  He reports a chronic numbness of the left arm that has been going on for more than a month.  Patient states that he was diagnosed with Charcot-Marie-Tooth disease at the same time of his cervical decompression surgery about 8 years ago.    HPI     Past Medical History:  Diagnosis Date  . Arthritis   . BPH (benign prostatic hyperplasia)   . Charcot-Marie-Tooth disease   . Foot drop, right   . Neuropathy   . Platelets decreased ([place])   . Spinal stenosis     Patient Active Problem List   Diagnosis Date Noted  . Right facial numbness 11/14/2019  . Blurry vision, right eye 11/14/2019  . 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 Cervical stenosis of spinal canal 11/16/2017  . 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 Foot drop, right 07/15/2017  . Gastroesophageal reflux disease with esophagitis 11/30/2016  . Benign prostatic hyperplasia with elevated prostate specific antigen (PSA) 11/30/2016    Past Surgical History:  Procedure Laterality Date  . CERVICAL DISC SURGERY  10 YEARS AGO   reports there are plates and screws in place   . FOOT ARTHROPLASTY    .  INGUINAL HERNIA REPAIR Right 06/15/2014   Procedure: RIGHT INGUINAL HERNIORRHAPHY  WITH MESH;  Surgeon: Aviva Signs Md, MD;  Location: [facility];  Service: General;  Laterality: Right;  . INSERTION OF MESH Right 06/15/2014   Procedure: INSERTION OF MESH RIGHT INGUINAL HERNIORRHAPHY;  Surgeon: Aviva Signs Md, MD;  Location: [facility];  Service: General;  Laterality: Right;  . KNEE ARTHROSCOPY Right   . TOTAL KNEE ARTHROPLASTY Right 01/21/2018   Procedure: TOTAL KNEE ARTHROPLASTY;  Surgeon: Sydnee Cabal, MD;  Location: [facility];  Service: Orthopedics;  Laterality: Right;       Family History  Problem Relation Age of Onset  . Suicidality Mother   . Other Father        "old age"    Social History   Tobacco Use  . Smoking status: Former Smoker    Packs/day: 0.50    Years: 36.00    Pack years: 18.00    Types: Cigarettes    Start date: 04/10/1989    Quit date: 2008    Years since quitting: 13.7  . Smokeless tobacco: Never Used  Vaping Use  . Vaping Use: Never used  Substance Use Topics  . Alcohol use: No    Alcohol/week: 0.0 standard drinks    Comment: none since 2005  . Drug use: No    Comment: none since 2000    Home Medications Prior to Admission medications   Medication Sig Start Date End Date  Taking? Authorizing Provider  pantoprazole (PROTONIX) 40 MG tablet Take 1 tablet (40 mg total) by mouth daily. 11/06/19  Yes Stacks, Cletus Gash, MD  tamsulosin (FLOMAX) 0.4 MG CAPS capsule TAKE TWO CAPSULES BY MOUTH DAILY AT BEDTIME Patient taking differently: Take 0.8 mg by mouth at bedtime.  11/06/19  Yes Claretta Fraise, MD    Allergies    Asa [aspirin] and Prednisone  Review of Systems   Review of Systems  HENT: Negative for trouble swallowing.   Eyes: Positive for visual disturbance.  Respiratory: Negative for shortness of breath.   Cardiovascular: Positive for chest pain.  Musculoskeletal: Positive for gait problem.  Neurological: Positive for numbness. Negative for facial  asymmetry, speech difficulty and weakness.    Physical Exam Updated Vital Signs BP (!) 169/85   Pulse 75   Temp 97.9 F (36.6 C)   Resp (!) 23   Ht 5\' 10"  (1.778 m)   Wt 81.6 kg   SpO2 98%   BMI 25.83 kg/m   Physical Exam Constitutional:      General: He is not in acute distress. HENT:     Head: Normocephalic.  Eyes:     General:        Right eye: No discharge.        Left eye: No discharge.  Cardiovascular:     Rate and Rhythm: Normal rate and regular rhythm.     Heart sounds: No murmur heard.   Pulmonary:     Effort: No respiratory distress.     Breath sounds: Normal breath sounds. No wheezing.  Abdominal:     General: Bowel sounds are normal.     Palpations: Abdomen is soft.  Skin:    General: Skin is warm.     Coloration: Skin is not jaundiced.  Neurological:     Mental Status: He is alert.     Comments: Decreased sensation of right sides of his face.  Other cranial nerves are normal. Normal strength of upper and lower extremities. No visual field defects at this time  Psychiatric:        Mood and Affect: Mood normal.     ED Results / Procedures / Treatments   Labs (all labs ordered are listed, but only abnormal results are displayed) Labs Reviewed  CBC WITH DIFFERENTIAL/PLATELET - Abnormal; Notable for the following components:      Result Value   Platelets 121 (*)    All other components within normal limits  COMPREHENSIVE METABOLIC PANEL  RAPID HIV SCREEN (HIV 1/2 AB+AG)  RPR    EKG EKG Interpretation  Date/Time:  Tuesday November 14 2019 09:57:17 EDT Ventricular Rate:  68 PR Interval:    QRS Duration: 97 QT Interval:  401 QTC Calculation: 427 R Axis:   -43 Text Interpretation: Sinus rhythm Left axis deviation Abnormal ECG Confirmed by Carmin Muskrat ([id number]) on 11/14/2019 10:02:41 AM   Radiology MR ANGIO HEAD WO CONTRAST  Result Date: 11/14/2019 CLINICAL DATA:  Right facial numbness, blurry vision EXAM: MRI HEAD WITHOUT CONTRAST MRA  HEAD WITHOUT CONTRAST TECHNIQUE: Multiplanar, multiecho pulse sequences of the brain and surrounding structures were obtained without intravenous contrast. Angiographic images of the head were obtained using MRA technique without contrast. COMPARISON:  None. FINDINGS: MRI HEAD Brain: There is no acute infarction or intracranial hemorrhage. There is no intracranial mass, mass effect, or edema. There is no hydrocephalus or extra-axial fluid collection. Ventricles and sulci are within normal limits in size and configuration. Patchy foci of T2 hyperintensity in the  supratentorial white matter are nonspecific but may reflect mild to moderate chronic microvascular ischemic changes in a patient of this age. 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 Vascular: Major vessel flow voids at the skull base are preserved. Skull and upper cervical spine: Normal marrow signal is preserved. Sinuses/Orbits: Paranasal sinuses are aerated. Orbits are unremarkable. Other: Sella is unremarkable.  Mastoid air cells are clear. MRA HEAD Degraded by artifact. Intracranial internal carotid arteries are patent. Middle and anterior cerebral arteries are patent. Visual intracranial vertebral arteries are patent. The basilar artery is patent. The posterior cerebral arteries are patent. There is fetal origin of the left PCA. 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 There is no aneurysm. IMPRESSION: No acute infarction, hemorrhage, or mass. Moderate chronic microvascular ischemic changes. 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 Electronically Signed   By: Macy Mis M.D.   On: 11/14/2019 13:38   MR BRAIN WO CONTRAST  Result Date: 11/14/2019 CLINICAL DATA:  Right facial numbness, blurry vision EXAM: MRI HEAD WITHOUT CONTRAST MRA HEAD WITHOUT CONTRAST TECHNIQUE: Multiplanar, multiecho pulse sequences of the brain and surrounding structures were obtained without  intravenous contrast. Angiographic images of the head were obtained using MRA technique without contrast. COMPARISON:  None. FINDINGS: MRI HEAD Brain: There is no acute infarction or intracranial hemorrhage. There is no intracranial mass, mass effect, or edema. There is no hydrocephalus or extra-axial fluid collection. Ventricles and sulci are within normal limits in size and configuration. Patchy foci of T2 hyperintensity in the supratentorial white matter are nonspecific but may reflect mild to moderate chronic microvascular ischemic changes in a patient of this age. 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 Vascular: Major vessel flow voids at the skull base are preserved. Skull and upper cervical spine: Normal marrow signal is preserved. Sinuses/Orbits: Paranasal sinuses are aerated. Orbits are unremarkable. Other: Sella is unremarkable.  Mastoid air cells are clear. MRA HEAD Degraded by artifact. Intracranial internal carotid arteries are patent. Middle and anterior cerebral arteries are patent. Visual intracranial vertebral arteries are patent. The basilar artery is patent. The posterior cerebral arteries are patent. There is fetal origin of the left PCA. 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 There is no aneurysm. IMPRESSION: No acute infarction, hemorrhage, or mass. Moderate chronic microvascular ischemic changes. 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 Electronically Signed   By: Macy Mis M.D.   On: 11/14/2019 13:38    Procedures Procedures (including critical care time)  Medications Ordered in ED Medications - No data to display  ED Course  I have reviewed the triage vital signs and the nursing notes.  Pertinent labs & imaging results that were available during my care of the patient were reviewed by me and considered in my medical decision making (see chart for details).  Patient seen and examined.  Physical  exam reveals decreased sensation of the right side of his face.  Other cranial nerves are normal.  No facial drooping or speech abnormality noted.  Strength normal of upper and lower extremity.  No visual field defects tested.  Differentials include new symptoms of Charcot-Marie-Tooth disease or CVA or brain mass or neuralgia.  Will obtain CBC, CMP, HIV, RPR.  Also brain MRI and MRA to rule out any brain masses or acute ischemia.  BP (!) 169/85   Pulse 75   Temp 97.9 F (36.6 C)  Resp (!) 23   Ht 5\' 10"  (1.778 m)   Wt 81.6 kg   SpO2 98%   BMI 25.83 kg/m   Labs were came back unremarkable.  RPR still pending.  MRI is negative for acute infarct, hemorrhage or mass.  MRA only reveals stenosis of PCA.  His symptoms are likely caused by that Charcot-Marie-Tooth disease.  Plan: Referral to [facility] and patient will see Dr. Krista Blue, his previous neurologist.  Also advised patient to get an eye exam this year.  Patient is instructed to come back in the ED for worsening symptoms, facial drooping, slurred speech, or weakness.  Patient verbalizes understanding   MDM Rules/Calculators/A&P                          Patient presents to the ED for right side numbness, lightheadedness and right-sided blurry visions for 3 days.  Neuro exam was normal except for decreased sensation of the right side of his face.  Labs were came back normal.  MRI is negative for acute infarct, hemorrhage or masses.  MRA shows stenosis of PCA.  His symptoms can be caused by Charcot-Marie-Tooth disease.  Referral to [facility] for further evaluation of his neuropathy.  Also advised patient to get an eye exam to make sure his prescription is up-to-date.  Patient instructed to come back for worsening symptoms, facial drooping, slurred speech, or weakness.  Final Clinical Impression(s) / ED Diagnoses Final diagnoses:  Right facial numbness  Blurry vision, right eye    Rx / DC Orders ED  Discharge Orders         Ordered    Ambulatory referral to Neurology       Comments: An appointment is requested in approximately 1 week   11/14/19 1420           Gaylan Gerold, DO 11/14/19 1428    Carmin Muskrat, MD 11/14/19 1556

## 2019-11-15 ENCOUNTER — Emergency Department (HOSPITAL_COMMUNITY): Payer: PPO

## 2019-11-15 ENCOUNTER — Other Ambulatory Visit: Payer: Self-pay

## 2019-11-15 ENCOUNTER — Encounter (HOSPITAL_COMMUNITY): Payer: Self-pay | Admitting: Emergency Medicine

## 2019-11-15 ENCOUNTER — Inpatient Hospital Stay (HOSPITAL_COMMUNITY)
Admission: EM | Admit: 2019-11-15 | Discharge: 2019-11-17 | DRG: 065 | Payer: PPO | Attending: Internal Medicine | Admitting: Internal Medicine

## 2019-11-15 DIAGNOSIS — Z96651 Presence of right artificial knee joint: Secondary | ICD-10-CM | POA: Diagnosis present

## 2019-11-15 DIAGNOSIS — R29810 Facial weakness: Secondary | ICD-10-CM | POA: Diagnosis present

## 2019-11-15 DIAGNOSIS — I63532 Cerebral infarction due to unspecified occlusion or stenosis of left posterior cerebral artery: Principal | ICD-10-CM | POA: Diagnosis present

## 2019-11-15 DIAGNOSIS — E876 Hypokalemia: Secondary | ICD-10-CM

## 2019-11-15 DIAGNOSIS — I6529 Occlusion and stenosis of unspecified carotid artery: Secondary | ICD-10-CM | POA: Diagnosis present

## 2019-11-15 DIAGNOSIS — I639 Cerebral infarction, unspecified: Secondary | ICD-10-CM | POA: Diagnosis present

## 2019-11-15 DIAGNOSIS — Z818 Family history of other mental and behavioral disorders: Secondary | ICD-10-CM

## 2019-11-15 DIAGNOSIS — Z87891 Personal history of nicotine dependence: Secondary | ICD-10-CM

## 2019-11-15 DIAGNOSIS — G600 Hereditary motor and sensory neuropathy: Secondary | ICD-10-CM | POA: Diagnosis not present

## 2019-11-15 DIAGNOSIS — Z888 Allergy status to other drugs, medicaments and biological substances status: Secondary | ICD-10-CM

## 2019-11-15 DIAGNOSIS — R200 Anesthesia of skin: Secondary | ICD-10-CM | POA: Diagnosis not present

## 2019-11-15 DIAGNOSIS — Z79899 Other long term (current) drug therapy: Secondary | ICD-10-CM

## 2019-11-15 DIAGNOSIS — M4802 Spinal stenosis, cervical region: Secondary | ICD-10-CM | POA: Diagnosis present

## 2019-11-15 DIAGNOSIS — M62542 Muscle wasting and atrophy, not elsewhere classified, left hand: Secondary | ICD-10-CM | POA: Diagnosis present

## 2019-11-15 DIAGNOSIS — G8191 Hemiplegia, unspecified affecting right dominant side: Secondary | ICD-10-CM | POA: Diagnosis present

## 2019-11-15 DIAGNOSIS — M1990 Unspecified osteoarthritis, unspecified site: Secondary | ICD-10-CM | POA: Diagnosis present

## 2019-11-15 DIAGNOSIS — Z886 Allergy status to analgesic agent status: Secondary | ICD-10-CM

## 2019-11-15 DIAGNOSIS — H5340 Unspecified visual field defects: Secondary | ICD-10-CM | POA: Diagnosis present

## 2019-11-15 DIAGNOSIS — R4701 Aphasia: Secondary | ICD-10-CM | POA: Diagnosis present

## 2019-11-15 DIAGNOSIS — N400 Enlarged prostate without lower urinary tract symptoms: Secondary | ICD-10-CM

## 2019-11-15 DIAGNOSIS — D696 Thrombocytopenia, unspecified: Secondary | ICD-10-CM

## 2019-11-15 DIAGNOSIS — Z20822 Contact with and (suspected) exposure to covid-19: Secondary | ICD-10-CM | POA: Diagnosis present

## 2019-11-15 DIAGNOSIS — M21371 Foot drop, right foot: Secondary | ICD-10-CM | POA: Diagnosis present

## 2019-11-15 DIAGNOSIS — R300 Dysuria: Secondary | ICD-10-CM | POA: Diagnosis present

## 2019-11-15 DIAGNOSIS — H538 Other visual disturbances: Secondary | ICD-10-CM | POA: Diagnosis present

## 2019-11-15 DIAGNOSIS — R471 Dysarthria and anarthria: Secondary | ICD-10-CM | POA: Diagnosis present

## 2019-11-15 DIAGNOSIS — G63 Polyneuropathy in diseases classified elsewhere: Secondary | ICD-10-CM

## 2019-11-15 DIAGNOSIS — S80212A Abrasion, left knee, initial encounter: Secondary | ICD-10-CM | POA: Diagnosis present

## 2019-11-15 DIAGNOSIS — M21372 Foot drop, left foot: Secondary | ICD-10-CM | POA: Diagnosis present

## 2019-11-15 DIAGNOSIS — K089 Disorder of teeth and supporting structures, unspecified: Secondary | ICD-10-CM | POA: Diagnosis present

## 2019-11-15 DIAGNOSIS — M62541 Muscle wasting and atrophy, not elsewhere classified, right hand: Secondary | ICD-10-CM | POA: Diagnosis present

## 2019-11-15 DIAGNOSIS — R030 Elevated blood-pressure reading, without diagnosis of hypertension: Secondary | ICD-10-CM

## 2019-11-15 DIAGNOSIS — E785 Hyperlipidemia, unspecified: Secondary | ICD-10-CM | POA: Diagnosis present

## 2019-11-15 DIAGNOSIS — K219 Gastro-esophageal reflux disease without esophagitis: Secondary | ICD-10-CM | POA: Diagnosis present

## 2019-11-15 LAB — RESPIRATORY PANEL BY RT PCR (FLU A&B, COVID)
Influenza A by PCR: NEGATIVE
Influenza B by PCR: NEGATIVE
SARS Coronavirus 2 by RT PCR: NEGATIVE

## 2019-11-15 LAB — CBC
HCT: 44 % (ref 39.0–52.0)
Hemoglobin: 15.2 g/dL (ref 13.0–17.0)
MCH: 29.7 pg (ref 26.0–34.0)
MCHC: 34.5 g/dL (ref 30.0–36.0)
MCV: 85.9 fL (ref 80.0–100.0)
Platelets: 126 10*3/uL — ABNORMAL LOW (ref 150–400)
RBC: 5.12 MIL/uL (ref 4.22–5.81)
RDW: 13.2 % (ref 11.5–15.5)
WBC: 6.3 10*3/uL (ref 4.0–10.5)
nRBC: 0 % (ref 0.0–0.2)

## 2019-11-15 LAB — BASIC METABOLIC PANEL
Anion gap: 11 (ref 5–15)
BUN: 13 mg/dL (ref 8–23)
CO2: 23 mmol/L (ref 22–32)
Calcium: 9.2 mg/dL (ref 8.9–10.3)
Chloride: 103 mmol/L (ref 98–111)
Creatinine, Ser: 0.93 mg/dL (ref 0.61–1.24)
GFR calc non Af Amer: >60 mL/min (ref >60)
Glucose, Bld: 102 mg/dL — ABNORMAL HIGH (ref 70–99)
Potassium: 3.4 mmol/L — ABNORMAL LOW (ref 3.5–5.1)
Sodium: 137 mmol/L (ref 135–145)

## 2019-11-15 LAB — RPR: RPR Ser Ql: NONREACTIVE

## 2019-11-15 MED ORDER — GADOBUTROL 1 MMOL/ML IV SOLN
7.0000 mL | Freq: Once | INTRAVENOUS | Status: AC | PRN
  Administered 2019-11-15: 7 mL via INTRAVENOUS

## 2019-11-15 NOTE — Consult Note (Addendum)
[facility] TeleNeurology Consult Services  Stat Consult  Date of Service:   11/15/2019 21:23:49  Impression:     .  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 He already has an established stroke as well as T2 changes on MRI, and that, as well as the fact that his symptoms have been going on for the past few days and at least over the past 24 hours, would not likely be a good candidate for endovascular intervention. However, would recommend admission for stroke work-up and evaluation. Also spoke with [facility] on call as well on the evening of 10/6, and they agreed no EVT due to ischemic changes seen on MRI.   Our recommendations are outlined below.  Nursing Recommendations: Telemetry, IV Fluids, avoid dextrose containing fluids, Maintain euglycemia Neuro checks q4 hrs x 24 hrs and then per shift Head of bed 30 degrees  Consultations: Recommend Speech therapy if failed dysphagia screen Physical therapy/Occupational therapy  Additional Recommendations: MRI brain without IV contrast MRA head/neck TSH, A1c, lipid profile Transthoracic echocardiogram Continuous telemetry Physical, occupational, and speech therapies q4h neuro checks/NIHSS NPO until bedside swallow Neurology follow-up   Imaging: As ofMRI brain without contrast left PCA distribution infarct on diffusion as well as T2 changes. MR angiogram of the brain showed interval occlusion of the left  PCA.  Metrics: [facility] Notification Time: 11/15/2019 21:21:59 Stamp Time: 11/15/2019 21:23:49 Callback Response Time: 11/15/2019 21:24:59   ----------------------------------------------------------------------------------------------------  Chief Complaint: Stroke  History of Present Illness: Patient is a 69 year old Male.  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 Symptoms started about 3 days ago, and at first were intermittent. He was seen in this emergency department yesterday and had an MRI brain without contrast which showed no acute intracranial findings. However, he represented to the emergency department today due to persistent right-sided numbness as well as a right visual field cut but also made itself apparent yesterday as well. He had a repeat MRI brain this evening which showed an acute infarct in the left PCA territory of diffusion with some T2 signal changes on flair, as well as an interval left PCA occlusion on MR angiogram. He denies any history of strokes or TIAs. He is not on antiplatelets or anticoagulants normally.   Anticoagulant use:  No  Antiplatelet use: No    Examination: BP(155/90), Pulse(95), Blood Glucose(102) 1A: Level of Consciousness - Alert; keenly responsive + 0 1B: Ask Month and Age - Both Questions Right + 0 1C: Blink Eyes & Squeeze Hands - Performs Both Tasks + 0 2: Test Horizontal Extraocular Movements - Normal + 0 3: Test Visual Fields - Complete Hemianopia + 2 4: Test Facial Palsy (Use Grimace if Obtunded) - Normal symmetry + 0 5A: Test Left Arm Motor Drift - No Drift for 10 Seconds + 0 5B: Test Right Arm Motor Drift - Drift, but doesn't hit bed + 1 6A: Test Left Leg Motor Drift - No Drift for 5 Seconds +  0 6B: Test Right Leg Motor Drift - Drift, hits bed + 2 7: Test Limb Ataxia (FNF/Heel-Shin) - No Ataxia + 0 8: Test Sensation -  Mild-Moderate Loss: Can Sense Being Touched + 1 9: Test Language/Aphasia - Normal; No aphasia + 0 10: Test Dysarthria - Normal + 0 11: Test Extinction/Inattention - No abnormality + 0  NIHSS Score: 6   Patient/Family was informed the Neurology Consult would occur via TeleHealth consult by way of interactive audio and video telecommunications and consented to receiving care in this manner.  Patient is being evaluated for possible acute neurologic impairment and high probability of imminent or life-threatening deterioration. I spent total of 35 minutes providing care to this patient, including time for face to face visit via telemedicine, review of medical records, imaging studies and discussion of findings with providers, the patient and/or family.   Dr Knox Royalty   [facility] [phone]  Case [id number]

## 2019-11-15 NOTE — ED Notes (Signed)
Pt awaiting teleneuro

## 2019-11-15 NOTE — ED Notes (Signed)
telecart wheeled to room

## 2019-11-15 NOTE — Progress Notes (Signed)
ASA 2. Appropriate.  ?

## 2019-11-15 NOTE — H&P (Signed)
History and Physical  Whitt Auletta HYW:[id number] DOB: Jan 03, 1951 DOA: 11/15/2019  Referring physician: Aundria Rud, MD PCP: Claretta Fraise, MD  Patient coming from: Home  Chief Complaint: Right-sided tingling numbness  HPI: Philip Stone is a 69 y.o. male with medical history significant for  Charcot-Marie-Tooth disease, right foot drop, high arched feet, neuropathy, cervical stenosis status post decompression who presents to the emergency department due to right-sided facial and visual symptoms over the last 3 days.  He was seen in the emergency department yesterday and MRI brain done at that time showed no stroke, neuro exam done was normal except for decreased sensation on the right side of his face, his symptoms were suspected to be due to history of coronary tooth disease, so patient was discharged from the ED and was referred to [facility] for further evaluation of his neuropathy.  Patient returned to the ED today with complaint of worsening right arm right leg numbness (new from yesterday), he continues to have right sided visual field defect, but speech was fine.  ED Course: In the emergency department, initial BP was 178/108, but other vital signs were within normal range.  Work-up in the ED showed thrombocytopenia and mild hypokalemia.  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  MRA head without contrast showed interval occlusion of the left PCA since previous exam. MRA neck with and without contrast showed mild short segment stenosis at the origin of the left ICA.  Teleneurologist was consulted and further stroke work-up was recommended.  Hospitalist was asked to admit patient for further evaluation and management.  Review of Systems: Constitutional: Negative for chills and fever.  HENT:  Negative for ear pain and sore throat.   Eyes: Negative for pain and positive for visual disturbance.  Respiratory: Negative for cough, chest tightness and shortness of breath.   Cardiovascular: Negative for chest pain and palpitations.  Gastrointestinal: Negative for abdominal pain and vomiting.  Endocrine: Negative for polyphagia and polyuria.  Genitourinary: Negative for decreased urine volume, dysuria, enuresis, hematuria, vaginal discharge and vaginal pain.  Musculoskeletal: Negative for arthralgias and back pain.  Skin: Negative for color change and rash.  Allergic/Immunologic: Negative for immunocompromised state.  Neurological: Positive for right-sided mild tingling and numbness.  Negative for tremors, syncope, speech difficulty Hematological: Does not bruise/bleed easily.  All other systems reviewed and are negative  Past Medical History:  Diagnosis Date  . Arthritis   . BPH (benign prostatic hyperplasia)   . Charcot-Marie-Tooth disease   . Foot drop, right   . Neuropathy   . Platelets decreased ([place])   . Spinal stenosis    Past Surgical History:  Procedure Laterality Date  . CERVICAL DISC SURGERY  10 YEARS AGO   reports there are plates and screws in place   . FOOT ARTHROPLASTY    . INGUINAL HERNIA REPAIR Right 06/15/2014   Procedure: RIGHT INGUINAL HERNIORRHAPHY  WITH MESH;  Surgeon: Aviva Signs Md, MD;  Location: [facility];  Service: General;  Laterality: Right;  . INSERTION OF MESH Right 06/15/2014   Procedure: INSERTION OF MESH RIGHT INGUINAL HERNIORRHAPHY;  Surgeon: Aviva Signs Md, MD;  Location: [facility];  Service: General;  Laterality: Right;  . KNEE ARTHROSCOPY Right   . TOTAL KNEE ARTHROPLASTY Right 01/21/2018   Procedure: TOTAL KNEE ARTHROPLASTY;  Surgeon: Sydnee Cabal, MD;  Location: [facility];  Service: Orthopedics;  Laterality: Right;    Social History:  reports that he quit smoking about 13 years ago. His smoking use included cigarettes. He started smoking about  30 years ago. He has a 18.00 pack-year smoking history. He has never used smokeless tobacco. He reports that he does not drink alcohol and does not use drugs.   Allergies  Allergen Reactions  . Asa [Aspirin] Anaphylaxis  . Prednisone     Makes him lightheaded    Family History  Problem Relation Age of Onset  . Suicidality Mother   . Other Father        "old age"    Prior to Admission medications   Medication Sig Start Date End Date Taking? Authorizing Provider  pantoprazole (PROTONIX) 40 MG tablet Take 1 tablet (40 mg total) by mouth daily. 11/06/19  Yes Stacks, Cletus Gash, MD  tamsulosin (FLOMAX) 0.4 MG CAPS capsule TAKE TWO CAPSULES BY MOUTH DAILY AT BEDTIME Patient taking differently: Take 0.8 mg by mouth at bedtime.  11/06/19  Yes Claretta Fraise, MD    Physical Exam: BP (!) 152/92   Pulse 87   Temp 98.4 F (36.9 C) (Oral)   Resp 20   Ht 5\' 10"  (1.778 m)   Wt 81.6 kg   SpO2 98%   BMI 25.82 kg/m   . General: 69 y.o. year-old male well developed well nourished in no acute distress.  Alert and oriented x3. Marland Kitchen HEENT: NCAT, EOMI . Neck: Supple, trachea midline . Cardiovascular: Regular rate and rhythm with no rubs or gallops.  No thyromegaly or JVD noted.  No lower extremity edema. 2/4 pulses in all 4 extremities. Marland Kitchen Respiratory: Clear to auscultation with no wheezes or rales. Good inspiratory effort. . Abdomen: Soft nontender nondistended with normal bowel sounds x4 quadrants. . Muskuloskeletal: No cyanosis, clubbing or edema noted bilaterally . Neuro: NIHSS 5 (visual field -complete hemianopia +2; right arm motor drift, but does not hit bed +1; right leg motor drift, but does not hit bed, mild to moderate loss sensation-can sense being touched-+1). . Skin: No ulcerative lesions noted or rashes . Psychiatry: Judgement and insight appear normal. Mood is appropriate for condition and setting          Labs on Admission:  Basic Metabolic Panel: Recent Labs  Lab 11/14/19 1008  11/15/19 1741  NA 137 137  K 3.9 3.4*  CL 104 103  CO2 26 23  GLUCOSE 93 102*  BUN 13 13  CREATININE 0.88 0.93  CALCIUM 9.2 9.2   Liver Function Tests: Recent Labs  Lab 11/14/19 1008  AST 24  ALT 24  ALKPHOS 77  BILITOT 0.9  PROT 7.1  ALBUMIN 4.2   No results for input(s): LIPASE, AMYLASE in the last 168 hours. No results for input(s): AMMONIA in the last 168 hours. CBC: Recent Labs  Lab 11/14/19 1008 11/15/19 1741  WBC 4.9 6.3  NEUTROABS 2.9  --   HGB 14.8 15.2  HCT 42.6 44.0  MCV 85.9 85.9  PLT 121* 126*   Cardiac Enzymes: No results for input(s): CKTOTAL, CKMB, CKMBINDEX, TROPONINI in the last 168 hours.  BNP (last 3 results) No results for input(s): BNP in the last 8760 hours.  ProBNP (last 3 results) No results for input(s): PROBNP in the last 8760 hours.  CBG: No results for input(s): GLUCAP in the last 168 hours.  Radiological Exams on Admission: MR ANGIO HEAD WO CONTRAST  Result Date: 11/15/2019 CLINICAL DATA:  Initial evaluation for acute neuro deficit, stroke suspected. EXAM:  MRI HEAD WITHOUT CONTRAST MRA HEAD WITHOUT CONTRAST MRA NECK WITHOUT AND WITH CONTRAST TECHNIQUE: Multiplanar, multiecho pulse sequences of the brain and surrounding structures were obtained without intravenous contrast. Angiographic images of the Circle of Willis were obtained using MRA technique without intravenous contrast. Angiographic images of the neck were obtained using MRA technique without and with intravenous contrast. Carotid stenosis measurements (when applicable) are obtained utilizing NASCET criteria, using the distal internal carotid diameter as the denominator. CONTRAST:  83mL GADAVIST GADOBUTROL 1 MMOL/ML IV SOLN COMPARISON:  Recent MRI/MRA from 11/14/2019. FINDINGS: MRI HEAD FINDINGS Brain: There has been interval development of patchy restricted diffusion involving the parasagittal left temporal occipital region, compatible with an acute left PCA territory infarct,  overall moderate in size. Involvement is greatest at the mesial left temporal lobe/left hippocampal formation. Mild patchy involvement of the left thalamus noted as well. Additional punctate cortical infarct noted superiorly at the paramedian left parietal lobe (series 5, image thirty-five). No associated hemorrhage or mass effect. No other evidence for acute or interval infarction. Gray-white matter differentiation otherwise maintained. No susceptibility artifact to suggest acute or chronic intracranial hemorrhage. Underlying atrophy with moderate chronic microvascular ischemic disease again noted. No mass lesion, mass effect, or midline shift. No hydrocephalus or extra-axial fluid collection. Pituitary gland and suprasellar region normal. Midline structures intact. Vascular: Major intracranial vascular flow voids are maintained. Skull and upper cervical spine: Craniocervical junction within normal limits. Postsurgical changes partially visualize within the upper cervical spine. Bone marrow signal intensity normal. No scalp soft tissue abnormality. Sinuses/Orbits: Subtle flattening with T2 hypointense thickening noted at the posterolateral aspects of the globes bilaterally, right slightly greater than left (series 10, image 8), of uncertain significance. Globes and orbital soft tissues otherwise unremarkable. Scattered mucosal thickening noted within the frontoethmoidal sinuses. Paranasal sinuses are otherwise clear. No significant mastoid effusion. Inner ear structures grossly normal. Other: None. MRA HEAD FINDINGS ANTERIOR CIRCULATION: Distal cervical segments of the internal carotid arteries are widely patent with antegrade flow. Mild atheromatous irregularity throughout the carotid siphons without hemodynamically significant stenosis or other abnormality. A1 segments, anterior communicating artery complex, and anterior cerebral arteries widely patent and within normal limits. No M1 stenosis or occlusion. Distal  MCA branches well perfused and symmetric. POSTERIOR CIRCULATION: Vertebral arteries widely patent to the vertebrobasilar junction. Left vertebral artery strongly dominant, with a diffusely hypoplastic right vertebral artery. Both picas patent. Basilar widely patent to its distal aspect. Superior cerebral arteries patent bilaterally. Right PCA supplied via the basilar remains widely patent. Interval occlusion of the left PCA since previous exam. Some attenuated flow is seen within the left posterior communicating artery. No intracranial aneurysm. MRA NECK FINDINGS AORTIC ARCH: Visualized aortic arch of normal caliber. Bovine branching pattern with common origin of the innominate and left common carotid arteries noted. No hemodynamically significant stenosis seen about the origin of the great vessels. RIGHT CAROTID SYSTEM: Right CCA widely patent from its origin to the bifurcation without stenosis. No significant atheromatous narrowing about the right bifurcation. Right ICA widely patent to the skull base without stenosis or other abnormality. LEFT CAROTID SYSTEM: Left CCA widely patent from its origin to the bifurcation without stenosis. No more than mild short-segment 20% stenosis at the origin of the left ICA. Left ICA otherwise widely patent distally without stenosis or other vascular abnormality. VERTEBRAL ARTERIES: Both vertebral arteries arise from subclavian arteries. No proximal subclavian artery stenosis. Left vertebral artery strongly dominant, with a diffusely hypoplastic right vertebral artery. Both vertebral arteries widely patent  within the neck without stenosis or occlusion. IMPRESSION: MRI HEAD IMPRESSION: 1. 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 No associated mass effect. 2. Underlying atrophy with moderate chronic microvascular ischemic disease. 3. Subtle  flattening with T2 hypointense thickening at the posterolateral aspects of the globes bilaterally, of uncertain significance. Correlation with history and funduscopic exam suggested. MRA HEAD IMPRESSION: 1. Interval occlusion of the left PCA since previous exam. 2. Otherwise stable intracranial MRA. No other large vessel occlusion. No other hemodynamically significant or correctable stenosis. MRA NECK IMPRESSION: 1. No more than mild short-segment stenosis at the origin of the left ICA. Otherwise wide patency of both carotid artery systems within the neck. 2. Both vertebral arteries widely patent within the neck. Left vertebral artery strongly dominant, with a diffusely hypoplastic right vertebral artery. Electronically Signed   By: Jeannine Boga M.D.   On: 11/15/2019 20:13   MR ANGIO HEAD WO CONTRAST  Result Date: 11/14/2019 CLINICAL DATA:  Right facial numbness, blurry vision EXAM: MRI HEAD WITHOUT CONTRAST MRA HEAD WITHOUT CONTRAST TECHNIQUE: Multiplanar, multiecho pulse sequences of the brain and surrounding structures were obtained without intravenous contrast. Angiographic images of the head were obtained using MRA technique without contrast. COMPARISON:  None. FINDINGS: MRI HEAD Brain: There is no acute infarction or intracranial hemorrhage. There is no intracranial mass, mass effect, or edema. There is no hydrocephalus or extra-axial fluid collection. Ventricles and sulci are within normal limits in size and configuration. Patchy foci of T2 hyperintensity in the supratentorial white matter are nonspecific but may reflect mild to moderate chronic microvascular ischemic changes in a patient of this age. 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 Vascular: Major vessel flow voids at the skull base are preserved. Skull and upper cervical spine: Normal marrow signal is preserved. Sinuses/Orbits: Paranasal sinuses are aerated. Orbits are unremarkable. Other:  Sella is unremarkable.  Mastoid air cells are clear. MRA HEAD Degraded by artifact. Intracranial internal carotid arteries are patent. Middle and anterior cerebral arteries are patent. Visual intracranial vertebral arteries are patent. The basilar artery is patent. The posterior cerebral arteries are patent. There is fetal origin of the left PCA. 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 There is no aneurysm. IMPRESSION: No acute infarction, hemorrhage, or mass. Moderate chronic microvascular ischemic changes. 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 Electronically Signed   By: Macy Mis M.D.   On: 11/14/2019 13:38   MR ANGIO NECK W WO CONTRAST  Result Date: 11/15/2019 CLINICAL DATA:  Initial evaluation for acute neuro deficit, stroke suspected. EXAM: MRI HEAD WITHOUT CONTRAST MRA HEAD WITHOUT CONTRAST MRA NECK WITHOUT AND WITH CONTRAST TECHNIQUE: Multiplanar, multiecho pulse sequences of the brain and surrounding structures were obtained without intravenous contrast. Angiographic images of the Circle of Willis were obtained using MRA technique without intravenous contrast. Angiographic images of the neck were obtained using MRA technique without and with intravenous contrast. Carotid stenosis measurements (when applicable) are obtained utilizing NASCET criteria, using the distal internal carotid diameter as the denominator. CONTRAST:  40mL GADAVIST GADOBUTROL 1 MMOL/ML IV SOLN COMPARISON:  Recent MRI/MRA from 11/14/2019. FINDINGS: MRI HEAD FINDINGS Brain: There has been interval development of patchy restricted diffusion involving the parasagittal left temporal occipital region, compatible with an acute left PCA territory infarct, overall moderate in size. Involvement is greatest at the mesial left temporal lobe/left  hippocampal formation. Mild patchy involvement of the left thalamus noted as well. Additional punctate cortical infarct noted superiorly at the paramedian left  parietal lobe (series 5, image thirty-five). No associated hemorrhage or mass effect. No other evidence for acute or interval infarction. Gray-white matter differentiation otherwise maintained. No susceptibility artifact to suggest acute or chronic intracranial hemorrhage. Underlying atrophy with moderate chronic microvascular ischemic disease again noted. No mass lesion, mass effect, or midline shift. No hydrocephalus or extra-axial fluid collection. Pituitary gland and suprasellar region normal. Midline structures intact. Vascular: Major intracranial vascular flow voids are maintained. Skull and upper cervical spine: Craniocervical junction within normal limits. Postsurgical changes partially visualize within the upper cervical spine. Bone marrow signal intensity normal. No scalp soft tissue abnormality. Sinuses/Orbits: Subtle flattening with T2 hypointense thickening noted at the posterolateral aspects of the globes bilaterally, right slightly greater than left (series 10, image 8), of uncertain significance. Globes and orbital soft tissues otherwise unremarkable. Scattered mucosal thickening noted within the frontoethmoidal sinuses. Paranasal sinuses are otherwise clear. No significant mastoid effusion. Inner ear structures grossly normal. Other: None. MRA HEAD FINDINGS ANTERIOR CIRCULATION: Distal cervical segments of the internal carotid arteries are widely patent with antegrade flow. Mild atheromatous irregularity throughout the carotid siphons without hemodynamically significant stenosis or other abnormality. A1 segments, anterior communicating artery complex, and anterior cerebral arteries widely patent and within normal limits. No M1 stenosis or occlusion. Distal MCA branches well perfused and symmetric. POSTERIOR CIRCULATION: Vertebral arteries widely patent to the vertebrobasilar junction. Left vertebral artery strongly dominant, with a diffusely hypoplastic right vertebral artery. Both picas patent.  Basilar widely patent to its distal aspect. Superior cerebral arteries patent bilaterally. Right PCA supplied via the basilar remains widely patent. Interval occlusion of the left PCA since previous exam. Some attenuated flow is seen within the left posterior communicating artery. No intracranial aneurysm. MRA NECK FINDINGS AORTIC ARCH: Visualized aortic arch of normal caliber. Bovine branching pattern with common origin of the innominate and left common carotid arteries noted. No hemodynamically significant stenosis seen about the origin of the great vessels. RIGHT CAROTID SYSTEM: Right CCA widely patent from its origin to the bifurcation without stenosis. No significant atheromatous narrowing about the right bifurcation. Right ICA widely patent to the skull base without stenosis or other abnormality. LEFT CAROTID SYSTEM: Left CCA widely patent from its origin to the bifurcation without stenosis. No more than mild short-segment 20% stenosis at the origin of the left ICA. Left ICA otherwise widely patent distally without stenosis or other vascular abnormality. VERTEBRAL ARTERIES: Both vertebral arteries arise from subclavian arteries. No proximal subclavian artery stenosis. Left vertebral artery strongly dominant, with a diffusely hypoplastic right vertebral artery. Both vertebral arteries widely patent within the neck without stenosis or occlusion. IMPRESSION: MRI HEAD IMPRESSION: 1. 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 No associated mass effect. 2. Underlying atrophy with moderate chronic microvascular ischemic disease. 3. Subtle flattening with T2 hypointense thickening at the posterolateral aspects of the globes bilaterally, of uncertain significance. Correlation with history and funduscopic exam suggested. MRA HEAD IMPRESSION: 1. Interval occlusion of the left PCA  since previous exam. 2. Otherwise stable intracranial MRA. No other large vessel occlusion. No other hemodynamically significant or correctable stenosis. MRA NECK IMPRESSION: 1. No more than mild short-segment stenosis at the origin of the left ICA. Otherwise wide patency of both carotid artery systems within the neck. 2. Both vertebral  arteries widely patent within the neck. Left vertebral artery strongly dominant, with a diffusely hypoplastic right vertebral artery. Electronically Signed   By: Jeannine Boga M.D.   On: 11/15/2019 20:13   MR BRAIN WO CONTRAST  Result Date: 11/15/2019 CLINICAL DATA:  Initial evaluation for acute neuro deficit, stroke suspected. EXAM: MRI HEAD WITHOUT CONTRAST MRA HEAD WITHOUT CONTRAST MRA NECK WITHOUT AND WITH CONTRAST TECHNIQUE: Multiplanar, multiecho pulse sequences of the brain and surrounding structures were obtained without intravenous contrast. Angiographic images of the Circle of Willis were obtained using MRA technique without intravenous contrast. Angiographic images of the neck were obtained using MRA technique without and with intravenous contrast. Carotid stenosis measurements (when applicable) are obtained utilizing NASCET criteria, using the distal internal carotid diameter as the denominator. CONTRAST:  43mL GADAVIST GADOBUTROL 1 MMOL/ML IV SOLN COMPARISON:  Recent MRI/MRA from 11/14/2019. FINDINGS: MRI HEAD FINDINGS Brain: There has been interval development of patchy restricted diffusion involving the parasagittal left temporal occipital region, compatible with an acute left PCA territory infarct, overall moderate in size. Involvement is greatest at the mesial left temporal lobe/left hippocampal formation. Mild patchy involvement of the left thalamus noted as well. Additional punctate cortical infarct noted superiorly at the paramedian left parietal lobe (series 5, image thirty-five). No associated hemorrhage or mass effect. No other evidence for acute or  interval infarction. Gray-white matter differentiation otherwise maintained. No susceptibility artifact to suggest acute or chronic intracranial hemorrhage. Underlying atrophy with moderate chronic microvascular ischemic disease again noted. No mass lesion, mass effect, or midline shift. No hydrocephalus or extra-axial fluid collection. Pituitary gland and suprasellar region normal. Midline structures intact. Vascular: Major intracranial vascular flow voids are maintained. Skull and upper cervical spine: Craniocervical junction within normal limits. Postsurgical changes partially visualize within the upper cervical spine. Bone marrow signal intensity normal. No scalp soft tissue abnormality. Sinuses/Orbits: Subtle flattening with T2 hypointense thickening noted at the posterolateral aspects of the globes bilaterally, right slightly greater than left (series 10, image 8), of uncertain significance. Globes and orbital soft tissues otherwise unremarkable. Scattered mucosal thickening noted within the frontoethmoidal sinuses. Paranasal sinuses are otherwise clear. No significant mastoid effusion. Inner ear structures grossly normal. Other: None. MRA HEAD FINDINGS ANTERIOR CIRCULATION: Distal cervical segments of the internal carotid arteries are widely patent with antegrade flow. Mild atheromatous irregularity throughout the carotid siphons without hemodynamically significant stenosis or other abnormality. A1 segments, anterior communicating artery complex, and anterior cerebral arteries widely patent and within normal limits. No M1 stenosis or occlusion. Distal MCA branches well perfused and symmetric. POSTERIOR CIRCULATION: Vertebral arteries widely patent to the vertebrobasilar junction. Left vertebral artery strongly dominant, with a diffusely hypoplastic right vertebral artery. Both picas patent. Basilar widely patent to its distal aspect. Superior cerebral arteries patent bilaterally. Right PCA supplied via the  basilar remains widely patent. Interval occlusion of the left PCA since previous exam. Some attenuated flow is seen within the left posterior communicating artery. No intracranial aneurysm. MRA NECK FINDINGS AORTIC ARCH: Visualized aortic arch of normal caliber. Bovine branching pattern with common origin of the innominate and left common carotid arteries noted. No hemodynamically significant stenosis seen about the origin of the great vessels. RIGHT CAROTID SYSTEM: Right CCA widely patent from its origin to the bifurcation without stenosis. No significant atheromatous narrowing about the right bifurcation. Right ICA widely patent to the skull base without stenosis or other abnormality. LEFT CAROTID SYSTEM: Left CCA widely patent from its origin to the bifurcation without stenosis. No more than  mild short-segment 20% stenosis at the origin of the left ICA. Left ICA otherwise widely patent distally without stenosis or other vascular abnormality. VERTEBRAL ARTERIES: Both vertebral arteries arise from subclavian arteries. No proximal subclavian artery stenosis. Left vertebral artery strongly dominant, with a diffusely hypoplastic right vertebral artery. Both vertebral arteries widely patent within the neck without stenosis or occlusion. IMPRESSION: MRI HEAD IMPRESSION: 1. 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 No associated mass effect. 2. Underlying atrophy with moderate chronic microvascular ischemic disease. 3. Subtle flattening with T2 hypointense thickening at the posterolateral aspects of the globes bilaterally, of uncertain significance. Correlation with history and funduscopic exam suggested. MRA HEAD IMPRESSION: 1. Interval occlusion of the left PCA since previous exam. 2. Otherwise stable intracranial MRA. No other large vessel occlusion. No other hemodynamically  significant or correctable stenosis. MRA NECK IMPRESSION: 1. No more than mild short-segment stenosis at the origin of the left ICA. Otherwise wide patency of both carotid artery systems within the neck. 2. Both vertebral arteries widely patent within the neck. Left vertebral artery strongly dominant, with a diffusely hypoplastic right vertebral artery. Electronically Signed   By: Jeannine Boga M.D.   On: 11/15/2019 20:13   MR BRAIN WO CONTRAST  Result Date: 11/14/2019 CLINICAL DATA:  Right facial numbness, blurry vision EXAM: MRI HEAD WITHOUT CONTRAST MRA HEAD WITHOUT CONTRAST TECHNIQUE: Multiplanar, multiecho pulse sequences of the brain and surrounding structures were obtained without intravenous contrast. Angiographic images of the head were obtained using MRA technique without contrast. COMPARISON:  None. FINDINGS: MRI HEAD Brain: There is no acute infarction or intracranial hemorrhage. There is no intracranial mass, mass effect, or edema. There is no hydrocephalus or extra-axial fluid collection. Ventricles and sulci are within normal limits in size and configuration. Patchy foci of T2 hyperintensity in the supratentorial white matter are nonspecific but may reflect mild to moderate chronic microvascular ischemic changes in a patient of this age. 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 Vascular: Major vessel flow voids at the skull base are preserved. Skull and upper cervical spine: Normal marrow signal is preserved. Sinuses/Orbits: Paranasal sinuses are aerated. Orbits are unremarkable. Other: Sella is unremarkable.  Mastoid air cells are clear. MRA HEAD Degraded by artifact. Intracranial internal carotid arteries are patent. Middle and anterior cerebral arteries are patent. Visual intracranial vertebral arteries are patent. The basilar artery is patent. The posterior cerebral arteries are patent. There is fetal origin of the left PCA. High-grade  stenosis of the proximal left P2 PCA. There is no aneurysm. IMPRESSION: No acute infarction, hemorrhage, or mass. Moderate chronic microvascular ischemic changes. 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 Electronically Signed   By: Macy Mis M.D.   On: 11/14/2019 13:38    EKG: I independently viewed the EKG done and my findings are as followed: EKG was not done in the ED  Assessment/Plan Present on Admission: . Acute CVA (cerebrovascular accident) ([place]) . CMT (Charcot-Marie-Tooth disease) . Right facial numbness  Principal Problem:   Acute CVA (cerebrovascular accident) ([place]) Active Problems:   CMT (Charcot-Marie-Tooth disease)   Right facial numbness   Thrombocytopenia (HCC)   Hypokalemia  Acute cerebrovascular accident  MRI head without contrast showed interval development of moderate sized acute ischemic nonhemorrhagic left PCA territory infarct involving the parasagittal left temporal occipital region as well as the left thalamus.  MRA head without contrast showed interval occlusion of the left PCA since previous exam. Patient will be admitted to telemetry unit  Echocardiogram in the morning Continue fall precautions and neuro checks Lipid panel, TSH and hemoglobin A1c will be checked Continue PT/ST/OT eval and treat Bedside swallow eval by nursing prior to diet Tele neurology was consulted and recommended further stroke work-up.  Neurology will be consulted and we shall await further recommendations  Thrombocytopenia (chronic) Stable, continue to monitor platelet level with morning labs  Hypokalemia K+ 3.4, this will be replenished  Charcot-Marie-Tooth disease Stable, continue PT/OT eval and treat   DVT prophylaxis: SCDs (no indication for chemoprophylaxis at this time due to acute ischemic stroke)  Code Status: Full code  Family Communication: None at bedside  Disposition Plan:  Patient is from:                         home Anticipated DC to:                   SNF or family members home Anticipated DC date:               1 day Anticipated DC barriers:          Patient unstable for discharge at this time due to acute ischemic stroke that requires further stroke work-up and neurology consult in the morning.   Consults called: Teleneurology (ED team), neurology  Admission status: Observation    Bernadette Hoit MD [facility]  If 7PM-7AM, please contact night-coverage [email or web address] Password [email or web address]  11/16/2019, 12:57 AM

## 2019-11-15 NOTE — ED Triage Notes (Signed)
Pt states he can not see out of his right visual field. States his right arm and right leg are numb. Pt was seen in ED yesterday for same symptoms. Pt states it is worse today.

## 2019-11-15 NOTE — ED Provider Notes (Addendum)
[facility] EMERGENCY DEPARTMENT Provider Note   CSN: [id number] Arrival date & time: 11/15/19  1427     History Chief Complaint  Patient presents with  . Numbness    Philip Stone is a 69 y.o. male.  Patient seen yesterday in the emergency department with a complaint of dizziness lightheadedness right-sided facial numbness right-sided blurry vision for 3 days.  States it comes and goes over the last 3 days but it was worse yesterday.  Patient had MR and MRA done without any acute findings.  Patient was discharged with follow-up with [facility] neurology.  Patient returns today stating that things are worse in particular that his right arm and right leg are now numb.  He did not have that yesterday.  The visual field defect is still there.  Patient speech is fine.  He is oriented.  Past medical history is significant for CMT.  Prior right foot drop.        Past Medical History:  Diagnosis Date  . Arthritis   . BPH (benign prostatic hyperplasia)   . Charcot-Marie-Tooth disease   . Foot drop, right   . Neuropathy   . Platelets decreased ([place])   . Spinal stenosis     Patient Active Problem List   Diagnosis Date Noted  . Right facial numbness 11/14/2019  . Blurry vision, right eye 11/14/2019  . 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 Cervical stenosis of spinal canal 11/16/2017  . 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 Foot drop, right 07/15/2017  . Gastroesophageal reflux disease with esophagitis 11/30/2016  . Benign prostatic hyperplasia with elevated prostate specific antigen (PSA) 11/30/2016    Past Surgical History:  Procedure Laterality Date  . CERVICAL DISC SURGERY  10 YEARS AGO   reports there are plates and screws in place   . FOOT ARTHROPLASTY    . INGUINAL HERNIA REPAIR Right 06/15/2014   Procedure: RIGHT INGUINAL HERNIORRHAPHY  WITH MESH;  Surgeon: Aviva Signs Md, MD;  Location: [facility];  Service: General;  Laterality: Right;  . INSERTION OF MESH Right  06/15/2014   Procedure: INSERTION OF MESH RIGHT INGUINAL HERNIORRHAPHY;  Surgeon: Aviva Signs Md, MD;  Location: [facility];  Service: General;  Laterality: Right;  . KNEE ARTHROSCOPY Right   . TOTAL KNEE ARTHROPLASTY Right 01/21/2018   Procedure: TOTAL KNEE ARTHROPLASTY;  Surgeon: Sydnee Cabal, MD;  Location: [facility];  Service: Orthopedics;  Laterality: Right;       Family History  Problem Relation Age of Onset  . Suicidality Mother   . Other Father        "old age"    Social History   Tobacco Use  . Smoking status: Former Smoker    Packs/day: 0.50    Years: 36.00    Pack years: 18.00    Types: Cigarettes    Start date: 04/10/1989    Quit date: 2008    Years since quitting: 13.7  . Smokeless tobacco: Never Used  Vaping Use  . Vaping Use: Never used  Substance Use Topics  . Alcohol use: No    Alcohol/week: 0.0 standard drinks    Comment: none since 2005  . Drug use: No    Comment: none since 2000    Home Medications Prior to Admission medications   Medication Sig Start Date End Date Taking? Authorizing Provider  pantoprazole (PROTONIX) 40 MG tablet Take 1 tablet (40 mg total) by mouth daily. 11/06/19  Yes Stacks, Cletus Gash, MD  tamsulosin (FLOMAX) 0.4 MG CAPS capsule TAKE  TWO CAPSULES BY MOUTH DAILY AT BEDTIME Patient taking differently: Take 0.8 mg by mouth at bedtime.  11/06/19  Yes Claretta Fraise, MD    Allergies    Asa [aspirin] and Prednisone  Review of Systems   Review of Systems  Constitutional: Negative for chills and fever.  HENT: Negative for congestion, rhinorrhea and sore throat.   Eyes: Positive for visual disturbance.  Respiratory: Negative for cough and shortness of breath.   Cardiovascular: Negative for chest pain and leg swelling.  Gastrointestinal: Negative for abdominal pain, diarrhea, nausea and vomiting.  Genitourinary: Negative for dysuria.  Musculoskeletal: Negative for back pain and neck pain.  Skin: Negative for rash.  Neurological: Positive  for dizziness, light-headedness and numbness. Negative for speech difficulty, weakness and headaches.  Hematological: Does not bruise/bleed easily.  Psychiatric/Behavioral: Negative for confusion.    Physical Exam Updated Vital Signs BP (!) 178/108 (BP Location: Right Arm)   Pulse 87   Temp 98.4 F (36.9 C) (Oral)   Resp 20   Ht 1.778 m (5\' 10" )   Wt 81.6 kg   SpO2 100%   BMI 25.82 kg/m   Physical Exam Vitals and nursing note reviewed.  Constitutional:      General: He is not in acute distress.    Appearance: Normal appearance. He is well-developed. He is not ill-appearing or toxic-appearing.  HENT:     Head: Normocephalic and atraumatic.  Eyes:     Conjunctiva/sclera: Conjunctivae normal.  Cardiovascular:     Rate and Rhythm: Normal rate and regular rhythm.     Heart sounds: No murmur heard.   Pulmonary:     Effort: Pulmonary effort is normal. No respiratory distress.     Breath sounds: Normal breath sounds.  Abdominal:     Palpations: Abdomen is soft.     Tenderness: There is no abdominal tenderness.  Musculoskeletal:        General: No swelling. Normal range of motion.     Cervical back: Neck supple.  Skin:    General: Skin is warm and dry.  Neurological:     Mental Status: He is alert and oriented to person, place, and time.     Cranial Nerves: Cranial nerve deficit present.     Sensory: Sensory deficit present.     Motor: No weakness.     Comments: Patient with a right-sided visual field defect.  Numbness to the right side of the body.  No significant weakness.     ED Results / Procedures / Treatments   Labs (all labs ordered are listed, but only abnormal results are displayed) Labs Reviewed - No data to display  EKG None  Radiology MR ANGIO HEAD WO CONTRAST  Result Date: 11/14/2019 CLINICAL DATA:  Right facial numbness, blurry vision EXAM: MRI HEAD WITHOUT CONTRAST MRA HEAD WITHOUT CONTRAST TECHNIQUE: Multiplanar, multiecho pulse sequences of the  brain and surrounding structures were obtained without intravenous contrast. Angiographic images of the head were obtained using MRA technique without contrast. COMPARISON:  None. FINDINGS: MRI HEAD Brain: There is no acute infarction or intracranial hemorrhage. There is no intracranial mass, mass effect, or edema. There is no hydrocephalus or extra-axial fluid collection. Ventricles and sulci are within normal limits in size and configuration. Patchy foci of T2 hyperintensity in the supratentorial white matter are nonspecific but may reflect mild to moderate chronic microvascular ischemic changes in a patient of this age. 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 Vascular: Major vessel flow  voids at the skull base are preserved. Skull and upper cervical spine: Normal marrow signal is preserved. Sinuses/Orbits: Paranasal sinuses are aerated. Orbits are unremarkable. Other: Sella is unremarkable.  Mastoid air cells are clear. MRA HEAD Degraded by artifact. Intracranial internal carotid arteries are patent. Middle and anterior cerebral arteries are patent. Visual intracranial vertebral arteries are patent. The basilar artery is patent. The posterior cerebral arteries are patent. There is fetal origin of the left PCA. 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 There is no aneurysm. IMPRESSION: No acute infarction, hemorrhage, or mass. Moderate chronic microvascular ischemic changes. 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 Electronically Signed   By: Macy Mis M.D.   On: 11/14/2019 13:38   MR BRAIN WO CONTRAST  Result Date: 11/14/2019 CLINICAL DATA:  Right facial numbness, blurry vision EXAM: MRI HEAD WITHOUT CONTRAST MRA HEAD WITHOUT CONTRAST TECHNIQUE: Multiplanar, multiecho pulse sequences of the brain and surrounding structures were obtained without intravenous contrast. Angiographic images of the head were obtained using MRA  technique without contrast. COMPARISON:  None. FINDINGS: MRI HEAD Brain: There is no acute infarction or intracranial hemorrhage. There is no intracranial mass, mass effect, or edema. There is no hydrocephalus or extra-axial fluid collection. Ventricles and sulci are within normal limits in size and configuration. Patchy foci of T2 hyperintensity in the supratentorial white matter are nonspecific but may reflect mild to moderate chronic microvascular ischemic changes in a patient of this age. 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 Vascular: Major vessel flow voids at the skull base are preserved. Skull and upper cervical spine: Normal marrow signal is preserved. Sinuses/Orbits: Paranasal sinuses are aerated. Orbits are unremarkable. Other: Sella is unremarkable.  Mastoid air cells are clear. MRA HEAD Degraded by artifact. Intracranial internal carotid arteries are patent. Middle and anterior cerebral arteries are patent. Visual intracranial vertebral arteries are patent. The basilar artery is patent. The posterior cerebral arteries are patent. There is fetal origin of the left PCA. 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 There is no aneurysm. IMPRESSION: No acute infarction, hemorrhage, or mass. Moderate chronic microvascular ischemic changes. 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 Electronically Signed   By: Macy Mis M.D.   On: 11/14/2019 13:38    Procedures Procedures (including critical care time)  CRITICAL CARE Performed by: Fredia Sorrow Total critical care time: 35 minutes Critical care time was exclusive of separately billable procedures and treating other patients. Critical care was necessary to treat or prevent imminent or life-threatening deterioration. Critical care was time spent personally by me on the following activities: development of treatment plan with patient and/or surrogate as well as nursing,  discussions with consultants, evaluation of patient's response to treatment, examination of patient, obtaining history from patient or surrogate, ordering and performing treatments and interventions, ordering and review of laboratory studies, ordering and review of radiographic studies, pulse oximetry and re-evaluation of patient's condition.   Medications Ordered in ED Medications - No data to display  ED Course  I have reviewed the triage vital signs and the nursing notes.  Pertinent labs & imaging results that were available during my care of the patient were reviewed by me and considered in my medical decision making (see chart for details).    MDM Rules/Calculators/A&P  Patient seen yesterday had MR and  MRA with the only finding being high-grade stenosis of the proximal left P2 PCA.  Nothing of any acute infarction or hemorrhage or mass.  Past medical history is significant for CMT.  We will have teleneurology evaluate him since he is got the new numbness to the right side.  May all be related to for what he was seen yesterday.  But since he stating things are worse we will have teleneurology advised on any additional work-up at this point.  Patient nontoxic no acute distress.  Patient's labs yesterday without any significant abnormalities.    Final Clinical Impression(s) / ED Diagnoses Final diagnoses:  CMT (Charcot-Marie-Tooth disease)    Rx / DC Orders ED Discharge Orders    None       Fredia Sorrow, MD 11/15/19 1709    Fredia Sorrow, MD 11/15/19 1711  Had conversation with daytime teleneurologist.  They were getting get back for face-to-face.  I think there was a breakdown.  She she did order the MR and MRA head and neck studies.  We are seeing some acute findings.  Were now onto the overnight teleneurologist which is a different group.  So have reconsulted for them to take a look at these results.  There does seem to be some acute  findings.  Patient's symptoms have been ongoing for about 3 days.  Studies that were done yesterday showed no acute findings.  Awaiting teleneurology consultation.      Fredia Sorrow, MD 11/15/19 2126    Fredia Sorrow, MD 11/15/19 2135  Teleneurology confirms the new stroke.  Patient last normal for the new symptoms of today at 10:00 last night when he went to bed.  So this probably completed itself sometime during the night.  Not a candidate for any intervention.  They recommended aspirin.  They were okay with his blood pressure being a little on the high side.  With patient has a history of of aspirin allergy which is recorded as anaphylaxis.  Patient does not seem to be clear about whether he can have aspirin or not but will hold off.  Consult into the hospitalist for admission.  MRI already showing signs of somewhat of a completion of the stroke.    Fredia Sorrow, MD 11/15/19 2208

## 2019-11-15 NOTE — ED Notes (Signed)
Family in room  

## 2019-11-16 ENCOUNTER — Institutional Professional Consult (permissible substitution): Payer: PPO | Admitting: Neurology

## 2019-11-16 ENCOUNTER — Observation Stay (HOSPITAL_COMMUNITY): Payer: PPO

## 2019-11-16 DIAGNOSIS — Z96651 Presence of right artificial knee joint: Secondary | ICD-10-CM | POA: Diagnosis present

## 2019-11-16 DIAGNOSIS — I69351 Hemiplegia and hemiparesis following cerebral infarction affecting right dominant side: Secondary | ICD-10-CM | POA: Diagnosis not present

## 2019-11-16 DIAGNOSIS — Z79899 Other long term (current) drug therapy: Secondary | ICD-10-CM | POA: Diagnosis not present

## 2019-11-16 DIAGNOSIS — I6529 Occlusion and stenosis of unspecified carotid artery: Secondary | ICD-10-CM | POA: Diagnosis present

## 2019-11-16 DIAGNOSIS — G600 Hereditary motor and sensory neuropathy: Secondary | ICD-10-CM | POA: Diagnosis present

## 2019-11-16 DIAGNOSIS — H538 Other visual disturbances: Secondary | ICD-10-CM | POA: Diagnosis present

## 2019-11-16 DIAGNOSIS — K219 Gastro-esophageal reflux disease without esophagitis: Secondary | ICD-10-CM | POA: Diagnosis present

## 2019-11-16 DIAGNOSIS — R482 Apraxia: Secondary | ICD-10-CM | POA: Diagnosis not present

## 2019-11-16 DIAGNOSIS — I63532 Cerebral infarction due to unspecified occlusion or stenosis of left posterior cerebral artery: Secondary | ICD-10-CM | POA: Diagnosis present

## 2019-11-16 DIAGNOSIS — S80212A Abrasion, left knee, initial encounter: Secondary | ICD-10-CM | POA: Diagnosis present

## 2019-11-16 DIAGNOSIS — Z20822 Contact with and (suspected) exposure to covid-19: Secondary | ICD-10-CM | POA: Diagnosis present

## 2019-11-16 DIAGNOSIS — Z87891 Personal history of nicotine dependence: Secondary | ICD-10-CM | POA: Diagnosis not present

## 2019-11-16 DIAGNOSIS — I6389 Other cerebral infarction: Secondary | ICD-10-CM | POA: Diagnosis not present

## 2019-11-16 DIAGNOSIS — E785 Hyperlipidemia, unspecified: Secondary | ICD-10-CM | POA: Diagnosis present

## 2019-11-16 DIAGNOSIS — R030 Elevated blood-pressure reading, without diagnosis of hypertension: Secondary | ICD-10-CM | POA: Diagnosis not present

## 2019-11-16 DIAGNOSIS — R001 Bradycardia, unspecified: Secondary | ICD-10-CM | POA: Diagnosis not present

## 2019-11-16 DIAGNOSIS — M1990 Unspecified osteoarthritis, unspecified site: Secondary | ICD-10-CM | POA: Diagnosis present

## 2019-11-16 DIAGNOSIS — M4802 Spinal stenosis, cervical region: Secondary | ICD-10-CM | POA: Diagnosis present

## 2019-11-16 DIAGNOSIS — N401 Enlarged prostate with lower urinary tract symptoms: Secondary | ICD-10-CM | POA: Diagnosis not present

## 2019-11-16 DIAGNOSIS — R42 Dizziness and giddiness: Secondary | ICD-10-CM | POA: Diagnosis present

## 2019-11-16 DIAGNOSIS — I639 Cerebral infarction, unspecified: Secondary | ICD-10-CM | POA: Diagnosis not present

## 2019-11-16 DIAGNOSIS — D696 Thrombocytopenia, unspecified: Secondary | ICD-10-CM | POA: Diagnosis present

## 2019-11-16 DIAGNOSIS — G63 Polyneuropathy in diseases classified elsewhere: Secondary | ICD-10-CM | POA: Diagnosis not present

## 2019-11-16 DIAGNOSIS — M21371 Foot drop, right foot: Secondary | ICD-10-CM | POA: Diagnosis present

## 2019-11-16 DIAGNOSIS — Z818 Family history of other mental and behavioral disorders: Secondary | ICD-10-CM | POA: Diagnosis not present

## 2019-11-16 DIAGNOSIS — Z886 Allergy status to analgesic agent status: Secondary | ICD-10-CM | POA: Diagnosis not present

## 2019-11-16 DIAGNOSIS — R4701 Aphasia: Secondary | ICD-10-CM | POA: Diagnosis present

## 2019-11-16 DIAGNOSIS — G8191 Hemiplegia, unspecified affecting right dominant side: Secondary | ICD-10-CM | POA: Diagnosis present

## 2019-11-16 DIAGNOSIS — H5340 Unspecified visual field defects: Secondary | ICD-10-CM | POA: Diagnosis present

## 2019-11-16 DIAGNOSIS — K089 Disorder of teeth and supporting structures, unspecified: Secondary | ICD-10-CM | POA: Diagnosis present

## 2019-11-16 DIAGNOSIS — N400 Enlarged prostate without lower urinary tract symptoms: Secondary | ICD-10-CM | POA: Diagnosis present

## 2019-11-16 DIAGNOSIS — R338 Other retention of urine: Secondary | ICD-10-CM | POA: Diagnosis not present

## 2019-11-16 DIAGNOSIS — E876 Hypokalemia: Secondary | ICD-10-CM | POA: Diagnosis not present

## 2019-11-16 DIAGNOSIS — K5901 Slow transit constipation: Secondary | ICD-10-CM | POA: Diagnosis not present

## 2019-11-16 DIAGNOSIS — Z888 Allergy status to other drugs, medicaments and biological substances status: Secondary | ICD-10-CM | POA: Diagnosis not present

## 2019-11-16 LAB — CBC
HCT: 44.1 % (ref 39.0–52.0)
Hemoglobin: 15.4 g/dL (ref 13.0–17.0)
MCH: 29.8 pg (ref 26.0–34.0)
MCHC: 34.9 g/dL (ref 30.0–36.0)
MCV: 85.5 fL (ref 80.0–100.0)
Platelets: 139 10*3/uL — ABNORMAL LOW (ref 150–400)
RBC: 5.16 MIL/uL (ref 4.22–5.81)
RDW: 13.2 % (ref 11.5–15.5)
WBC: 7.5 10*3/uL (ref 4.0–10.5)
nRBC: 0 % (ref 0.0–0.2)

## 2019-11-16 LAB — LIPID PANEL
Cholesterol: 186 mg/dL (ref 0–200)
HDL: 52 mg/dL (ref >40)
LDL Cholesterol: 125 mg/dL — ABNORMAL HIGH (ref 0–99)
Total CHOL/HDL Ratio: 3.6 RATIO
Triglycerides: 44 mg/dL (ref <150)
VLDL: 9 mg/dL (ref 0–40)

## 2019-11-16 LAB — ECHOCARDIOGRAM COMPLETE
AR max vel: 3.53 cm2
AV Area VTI: 3.93 cm2
AV Area mean vel: 3.33 cm2
AV Mean grad: 3.2 mmHg
AV Peak grad: 5.4 mmHg
Ao pk vel: 1.16 m/s
Area-P 1/2: 2.46 cm2
Height: 70 in
S' Lateral: 2.15 cm
Weight: 2879.74 oz

## 2019-11-16 LAB — COMPREHENSIVE METABOLIC PANEL
ALT: 22 U/L (ref 0–44)
AST: 24 U/L (ref 15–41)
Albumin: 4.1 g/dL (ref 3.5–5.0)
Alkaline Phosphatase: 76 U/L (ref 38–126)
Anion gap: 9 (ref 5–15)
BUN: 14 mg/dL (ref 8–23)
CO2: 24 mmol/L (ref 22–32)
Calcium: 9.2 mg/dL (ref 8.9–10.3)
Chloride: 106 mmol/L (ref 98–111)
Creatinine, Ser: 0.92 mg/dL (ref 0.61–1.24)
GFR calc non Af Amer: >60 mL/min (ref >60)
Glucose, Bld: 102 mg/dL — ABNORMAL HIGH (ref 70–99)
Potassium: 4.2 mmol/L (ref 3.5–5.1)
Sodium: 139 mmol/L (ref 135–145)
Total Bilirubin: 1.2 mg/dL (ref 0.3–1.2)
Total Protein: 7.2 g/dL (ref 6.5–8.1)

## 2019-11-16 LAB — TSH: TSH: 1.635 u[IU]/mL (ref 0.350–4.500)

## 2019-11-16 LAB — VITAMIN B12: Vitamin B-12: 305 pg/mL (ref 180–914)

## 2019-11-16 LAB — PROTIME-INR
INR: 1.2 (ref 0.8–1.2)
Prothrombin Time: 14.7 seconds (ref 11.4–15.2)

## 2019-11-16 LAB — APTT: aPTT: 32 seconds (ref 24–36)

## 2019-11-16 LAB — MAGNESIUM: Magnesium: 2 mg/dL (ref 1.7–2.4)

## 2019-11-16 LAB — PHOSPHORUS: Phosphorus: 3.4 mg/dL (ref 2.5–4.6)

## 2019-11-16 LAB — FOLATE: Folate: 32.4 ng/mL (ref >5.9)

## 2019-11-16 LAB — HEMOGLOBIN A1C
Hgb A1c MFr Bld: 5.6 % (ref 4.8–5.6)
Mean Plasma Glucose: 114.02 mg/dL

## 2019-11-16 MED ORDER — POTASSIUM CHLORIDE 10 MEQ/100ML IV SOLN: 10.0000 meq | INTRAVENOUS | Status: DC

## 2019-11-16 MED ORDER — HYDRALAZINE HCL 20 MG/ML IJ SOLN: 10.0000 mg | Freq: Four times a day (QID) | INTRAMUSCULAR | Status: DC | PRN

## 2019-11-16 MED ORDER — ENOXAPARIN SODIUM 40 MG/0.4ML ~~LOC~~ SOLN
40.0000 mg | SUBCUTANEOUS | Status: DC
  Administered 2019-11-16 – 2019-11-17 (×2): 40 mg via SUBCUTANEOUS
  Filled 2019-11-16 (×2): qty 0.4

## 2019-11-16 MED ORDER — ATORVASTATIN CALCIUM 40 MG PO TABS
40.0000 mg | ORAL_TABLET | Freq: Every day | ORAL | Status: DC
  Administered 2019-11-16 – 2019-11-17 (×2): 40 mg via ORAL
  Filled 2019-11-16 (×2): qty 1

## 2019-11-16 MED ORDER — POTASSIUM CHLORIDE CRYS ER 20 MEQ PO TBCR
40.0000 meq | EXTENDED_RELEASE_TABLET | Freq: Once | ORAL | Status: AC
  Administered 2019-11-16: 40 meq via ORAL
  Filled 2019-11-16: qty 2

## 2019-11-16 MED ORDER — CLOPIDOGREL BISULFATE 75 MG PO TABS
75.0000 mg | ORAL_TABLET | Freq: Every day | ORAL | Status: DC
  Administered 2019-11-16 – 2019-11-17 (×2): 75 mg via ORAL
  Filled 2019-11-16 (×2): qty 1

## 2019-11-16 MED ORDER — PANTOPRAZOLE SODIUM 40 MG PO TBEC
40.0000 mg | DELAYED_RELEASE_TABLET | Freq: Every day | ORAL | Status: DC
  Administered 2019-11-16 – 2019-11-17 (×2): 40 mg via ORAL
  Filled 2019-11-16 (×2): qty 1

## 2019-11-16 NOTE — Plan of Care (Signed)
  Problem: Acute Rehab PT Goals(only PT should resolve) Goal: Pt Will Go Supine/Side To Sit Outcome: Progressing Flowsheets (Taken 11/16/2019 1100) Pt will go Supine/Side to Sit: with modified independence Goal: Patient Will Transfer Sit To/From Stand Outcome: Progressing Flowsheets (Taken 11/16/2019 1100) Patient will transfer sit to/from stand:  with minimal assist  with min guard assist Goal: Pt Will Transfer Bed To Chair/Chair To Bed Outcome: Progressing Flowsheets (Taken 11/16/2019 1100) Pt will Transfer Bed to Chair/Chair to Bed:  min guard assist  with min assist Goal: Pt Will Ambulate Outcome: Progressing Flowsheets (Taken 11/16/2019 1100) Pt will Ambulate:  50 feet  with min guard assist  with minimal assist  with rolling walker   11:01 AM, 11/16/19 Lonell Grandchild, MPT Physical Therapist with [facility] [phone] office [phone] mobile phone

## 2019-11-16 NOTE — TOC Initial Note (Signed)
Transition of Care [facility]) - Initial/Assessment Note    Patient Details  Name: Abubakr Wieman MRN: [id number] Date of Birth: Feb 16, 1950  [facility] CM/SW Contact:    Boneta Lucks, RN Phone Number: 11/16/2019, 2:05 PM  Clinical Narrative:      Patient admitted with CVA, PT is recommending CIR. Patient and girlfriend is accepting. CIR is starting INS AUTH.             Expected Discharge Plan:  (CIR) Barriers to Discharge: Continued Medical Work up   Patient Goals and CMS Choice Patient states their goals for this hospitalization and ongoing recovery are:: To go to CIR CMS Medicare.gov Compare Post Acute Care list provided to:: Patient Choice offered to / list presented to : Patient  Expected Discharge Plan and Services Expected Discharge Plan:  (CIR)          Prior Living Arrangements/Services           Need for Family Participation in Patient Care: Yes (Comment) Care giver support system in place?: Yes (comment)   Criminal Activity/Legal Involvement Pertinent to Current Situation/Hospitalization: No - Comment as needed   Assessment   Affect (typically observed): Accepting Orientation: : Oriented to Self, Oriented to Place, Oriented to  Time, Oriented to Situation Alcohol / Substance Use: Tobacco Use Psych Involvement: No (comment)  Admission diagnosis:  CMT (Charcot-Marie-Tooth disease) [G60.0] Acute CVA (cerebrovascular accident) ([place]) [I63.9] Cerebrovascular accident (CVA), unspecified mechanism ([place]) [I63.9] Acute ischemic stroke [facility]) [I63.9] Patient Active Problem List   Diagnosis Date Noted  . Thrombocytopenia ([place]) 11/16/2019  . Hypokalemia 11/16/2019  . Acute ischemic stroke ([place]) 11/16/2019  . Acute CVA (cerebrovascular accident) ([place]) 11/15/2019  . Right facial numbness 11/14/2019  . Blurry vision, right eye 11/14/2019  . 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 Cervical stenosis of spinal canal 11/16/2017  . CMT (Charcot-Marie-Tooth disease)  11/16/2017  . Foot drop, right 07/15/2017  . Gastroesophageal reflux disease with esophagitis 11/30/2016  . Benign prostatic hyperplasia with elevated prostate specific antigen (PSA) 11/30/2016   PCP:  Claretta Fraise, MD Pharmacy:   [id number] - [place], [place] [address] Phone: [phone] Fax: [phone]  [id number] - [place], [place] [address] Phone: [phone] Fax: [phone]  [place], [place] [address] [place] [address] Phone: [phone] Fax: [phone]

## 2019-11-16 NOTE — Progress Notes (Signed)
*  PRELIMINARY RESULTS* Echocardiogram 2D Echocardiogram has been performed.  Leavy Cella 11/16/2019, 10:37 AM

## 2019-11-16 NOTE — Evaluation (Signed)
Speech Language Pathology Evaluation Patient Details Name: Philip Stone MRN: [id number] DOB: 25-Nov-1950 Today's Date: 11/16/2019 Time: 8250-0370 SLP Time Calculation (min) (ACUTE ONLY): 34 min  Problem List:  Patient Active Problem List   Diagnosis Date Noted  . Thrombocytopenia ([place]) 11/16/2019  . Hypokalemia 11/16/2019  . Acute ischemic stroke ([place]) 11/16/2019  . Acute CVA (cerebrovascular accident) ([place]) 11/15/2019  . Right facial numbness 11/14/2019  . Blurry vision, right eye 11/14/2019  . 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 Cervical stenosis of spinal canal 11/16/2017  . 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 Foot drop, right 07/15/2017  . Gastroesophageal reflux disease with esophagitis 11/30/2016  . Benign prostatic hyperplasia with elevated prostate specific antigen (PSA) 11/30/2016   Past Medical History:  Past Medical History:  Diagnosis Date  . Arthritis   . BPH (benign prostatic hyperplasia)   . Charcot-Marie-Tooth disease   . Foot drop, right   . Neuropathy   . Platelets decreased ([place])   . Spinal stenosis    Past Surgical History:  Past Surgical History:  Procedure Laterality Date  . CERVICAL DISC SURGERY  10 YEARS AGO   reports there are plates and screws in place   . FOOT ARTHROPLASTY    . INGUINAL HERNIA REPAIR Right 06/15/2014   Procedure: RIGHT INGUINAL HERNIORRHAPHY  WITH MESH;  Surgeon: Aviva Signs Md, MD;  Location: [facility];  Service: General;  Laterality: Right;  . INSERTION OF MESH Right 06/15/2014   Procedure: INSERTION OF MESH RIGHT INGUINAL HERNIORRHAPHY;  Surgeon: Aviva Signs Md, MD;  Location: [facility];  Service: General;  Laterality: Right;  . KNEE ARTHROSCOPY Right   . TOTAL KNEE ARTHROPLASTY Right 01/21/2018   Procedure: TOTAL KNEE ARTHROPLASTY;  Surgeon: Sydnee Cabal, MD;  Location: [facility];  Service: Orthopedics;  Laterality: Right;   HPI:  69 year old male with a history of Charcot-Marie-Tooth disease and right foot drop,  cervical stenosis status post decompression, and GERD presenting with right face and right-sided numbness.  The patient states that his symptoms began on 11/13/2019 with dizziness and lightheadedness and right facial numbness as well as blurry vision on the right side.  His symptoms were waxing and waning at that time, but he visited the emergency department on 11/14/2019.  MRI of the brain on that day was negative for any acute findings.  The patient was subsequently discharged home in stable condition with instructions to follow-up with [facility] neurology.  His symptoms continue to progress with increasing right upper extremity and right lower extremity numbness and right-sided weakness.  He also complained of a bifrontal headache.  He denied any loss of consciousness.  as result, he represented to the emergency department on 11/15/2019.  He also complained of some word finding difficulties.  He denied any fevers, chills, chest pain, shortness of breath, coughing, hemoptysis, nausea, vomiting, diarrhea, abdominal pain, dysuria, hematuria. MRI now shows 10/6 MRI brain--acute ischemic left PCA infarct involving the left temporal occipital lobe and thalamus. SLE requested. Pt passed the [facility] swallow screen.    Assessment / Plan / Recommendation Clinical Impression  Pt presents with moderate expressive aphasia and mild cogntive deficits likely negatively impated by expressive aphasia and visual deficits. He is generally fluent with word substitutions and paraphasic errors and is able to follow 2-step commands and respond appropriately to yes/no questions. Pt will benefit from skilled SLP in order to address the above impairments, maximize independence, and decrease burden of care. He is highly motivated to improve and has good support (friend, [name]  here). Recommend CIR vs SNF. SLP to follow during acute stay.       SLP Assessment  SLP Recommendation/Assessment: Patient needs continued Speech Lanaguage Pathology  Services SLP Visit Diagnosis: Aphasia (R47.01);Cognitive communication deficit (R41.841)    Follow Up Recommendations  Inpatient Rehab    Frequency and Duration min 2x/week  1 week      SLP Evaluation Cognition  Overall Cognitive Status: Impaired/Different from baseline Arousal/Alertness: Awake/alert Orientation Level: Oriented to person;Oriented to place;Oriented to situation (off by year) Memory: Impaired Memory Impairment: Decreased short term memory Awareness: Appears intact Problem Solving: Impaired Problem Solving Impairment: Verbal complex Executive Function: Self Monitoring;Self Correcting Self Monitoring: Impaired Self Monitoring Impairment: Verbal basic Self Correcting: Impaired Self Correcting Impairment: Verbal basic Safety/Judgment: Appears intact       Comprehension  Auditory Comprehension Overall Auditory Comprehension: Appears within functional limits for tasks assessed Yes/No Questions: Within Functional Limits Commands: Within Functional Limits Conversation: Simple Visual Recognition/Discrimination Discrimination: Not tested Reading Comprehension Reading Status: Unable to assess (comment)    Expression Expression Primary Mode of Expression: Verbal Verbal Expression Overall Verbal Expression: Impaired Initiation: No impairment Automatic Speech: Name;Social Response Level of Generative/Spontaneous Verbalization: Phrase Repetition: Impaired Level of Impairment: Sentence level Naming: Impairment Responsive: 51-75% accurate Divergent: 25-49% accurate Verbal Errors: Semantic paraphasias;Phonemic paraphasias;Perseveration;Aware of errors;Not aware of errors Effective Techniques: Phonemic cues (binary choices) Non-Verbal Means of Communication: Not applicable Written Expression Dominant Hand: Right Written Expression: Unable to assess (comment)   Oral / Motor  Oral Motor/Sensory Function Overall Oral Motor/Sensory Function: Within functional  limits Motor Speech Overall Motor Speech: Appears within functional limits for tasks assessed Respiration: Within functional limits Phonation: Normal Resonance: Within functional limits Articulation: Within functional limitis Intelligibility: Intelligible Motor Planning: Witnin functional limits Motor Speech Errors: Not applicable   Thank you,  Genene Churn, [place]                     [place] 11/16/2019, 2:42 PM

## 2019-11-16 NOTE — PMR Pre-admission (Signed)
PMR Admission Coordinator Pre-Admission Assessment  Patient: Philip Stone is an 69 y.o., male MRN: [id number] DOB: [birth year] Height: 5\' 10"  (177.8 cm) Weight: 82 kg  Insurance Information  PRIMARY: [employer]      Policy#: [id number]      Subscriber: pt CM Name: Marlowe Kays   Phone#: [phone]     Fax#: full Epic access Pre-Cert#: [id number] for 7 days     Employer:  Benefits:  Phone #: [phone]     Name: 10/7 Eff. Date: 12/11/2014     Deduct: none      Out of Pocket Max: $2100      Life Max: none CIR: $250 co pay day 1, $125 co pay per day days 2 though 6      SNF: no copay days 1 until 20; $160 copay per day days 21 until 100 Outpatient: $10 per visit     Co-Pay: per medical neccesity Home Health: 80%      Co-Pay: 20% DME: 80%     Co-Pay: 20% Providers: in network  SECONDARY: none  Financial Counselor:       Phone#:   The Engineer, petroleum" for patients in Inpatient Rehabilitation Facilities with attached "Privacy Act [place] Records" was provided and verbally reviewed with: Patient and Family  Emergency Contact Information Contact Information    Name Relation Home Work Mobile   [place], [place]   [phone]   [place], [place] [phone]        Current Medical History  Patient Admitting Diagnosis: CVA  History of Present Illness:    69 year old right-handed male with history significant for Charcot-Marie-Tooth disease, right foot drop with neuropathy, cervical stenosis with decompression and discectomy 10 years ago, right TKA 01/21/2018, quit smoking 13 years ago.  Presented 11/15/2019 to [facility] with right side weakness and numbness x3 days as well as aphasia.  Initial MRI/MRA showed no acute infarction hemorrhage or mass.  Moderate chronic microvascular ischemic changes.  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  Follow-up MRI showed interval development of moderate size acute ischemic  nonhemorrhagic left PCA territory infarct involving the parasagittal left temporal occipital region as well as the left thalamus.  No associated mass-effect.  MRA of the head interval occlusion of left PCA since previous exam.  No other large vessel occlusion.  MRA of the neck no more than mild short segment stenosis of the origin of the left ICA.  Both vertebral arteries widely patent within the neck.  Patient did not receive TPA.  Echocardiogram with ejection fraction of 60 to 65% no wall motion abnormalities grade 1 diastolic dysfunction.  Admission chemistries were unremarkable, hemoglobin 15.2, platelets 126,000.  Neurology follow-up Dr.[place] and maintained on Plavix for CVA prophylaxis.  Subcutaneous Lovenox for DVT prophylaxis.  Tolerating a regular diet.  Noted on 11/17/2019 patient found sitting on the floor in front of his bed with soiled linens no apparent injury except for abrasion left knee   Complete NIHSS TOTAL: 15  Patient's medical record from [facility]  has been reviewed by the rehabilitation admission coordinator and physician.  Past Medical History  Past Medical History:  Diagnosis Date  . Arthritis   . BPH (benign prostatic hyperplasia)   . Charcot-Marie-Tooth disease   . Foot drop, right   . Neuropathy   . Platelets decreased ([place])   . Spinal stenosis     Family History   family history includes Other in his father; Suicidality in  his mother.  Prior Rehab/Hospitalizations Has the patient had prior rehab or hospitalizations prior to admission? Yes  Has the patient had major surgery during 100 days prior to admission? No   Current Medications  Current Facility-Administered Medications:  .  atorvastatin (LIPITOR) tablet 40 mg, 40 mg, Oral, Daily, Tat, David, MD, 40 mg at 11/17/19 0811 .  clopidogrel (PLAVIX) tablet 75 mg, 75 mg, Oral, Daily, Tat, David, MD, 75 mg at 11/17/19 1610 .  enoxaparin (LOVENOX) injection 40 mg, 40 mg, Subcutaneous, Q24H, Tat,  David, MD, 40 mg at 11/17/19 9604 .  hydrALAZINE (APRESOLINE) injection 10 mg, 10 mg, Intravenous, Q6H PRN, Tat, David, MD .  pantoprazole (PROTONIX) EC tablet 40 mg, 40 mg, Oral, QAC breakfast, Tat, David, MD, 40 mg at 11/17/19 5409  Patients Current Diet:  Diet Order            Diet Heart Room service appropriate? Yes; Fluid consistency: Thin  Diet effective now                 Precautions / Restrictions Precautions Precautions: Fall Other Brace: bilateral AFO's in shoes Restrictions Weight Bearing Restrictions: No   Has the patient had 2 or more falls or a fall with injury in the past year? No Fall in acute hospital early am of 10/8 without injury  Prior Activity Level Community (5-7x/wk): Mod I with BLE AFOS, drove  Prior Functional Level Self Care: Did the patient need help bathing, dressing, using the toilet or eating? Independent  Indoor Mobility: Did the patient need assistance with walking from room to room (with or without device)? Independent  Stairs: Did the patient need assistance with internal or external stairs (with or without device)? Independent  Functional Cognition: Did the patient need help planning regular tasks such as shopping or remembering to take medications? Independent  Home Assistive Devices / Equipment Home Assistive Devices/Equipment: Brace (specify type) (Bilateral Lower leg braces) Home Equipment: Walker - 2 wheels, Cane - single point, Bedside commode, Shower seat  Prior Device Use: Indicate devices/aids used by the patient prior to current illness, exacerbation or injury? BLE AFOs  Current Functional Level Cognition  Arousal/Alertness: Awake/alert Overall Cognitive Status: Impaired/Different from baseline Orientation Level: Oriented X4 Memory: Impaired Memory Impairment: Decreased short term memory Awareness: Appears intact Problem Solving: Impaired Problem Solving Impairment: Verbal complex Executive Function: Self Monitoring,  Self Correcting Self Monitoring: Impaired Self Monitoring Impairment: Verbal basic Self Correcting: Impaired Self Correcting Impairment: Verbal basic Safety/Judgment: Appears intact    Extremity Assessment (includes Sensation/Coordination)  Upper Extremity Assessment: Defer to OT evaluation  Lower Extremity Assessment: RLE deficits/detail, LLE deficits/detail RLE Deficits / Details: grossly -3/5 except ankle dorsiflexors 1/5 RLE Sensation: decreased light touch, decreased proprioception, history of peripheral neuropathy RLE Coordination: decreased fine motor LLE Deficits / Details: grossly 4+/5, except ankle dorsiflexors -2/5 LLE Sensation: WNL LLE Coordination: decreased fine motor    ADLs       Mobility  Overal bed mobility: Needs Assistance Bed Mobility: Supine to Sit, Sit to Supine Supine to sit: Min guard Sit to supine: Min guard General bed mobility comments: increased time, labored movement    Transfers  Overall transfer level: Needs assistance Equipment used: Rolling walker (2 wheeled), None Transfers: Sit to/from Stand, Risk manager Sit to Stand: Min assist Stand pivot transfers: Min assist, Mod assist General transfer comment: neglict of right side, very unsteady with poor right handed grip strength to hold onto RW    Ambulation / Gait / Stairs /  Wheelchair Mobility  Ambulation/Gait Ambulation/Gait assistance: Mod assist Gait Distance (Feet): 30 Feet Assistive device: Rolling walker (2 wheeled) Gait Pattern/deviations: Step-to pattern, Decreased step length - right, Decreased step length - left, Decreased stance time - right, Decreased dorsiflexion - right, Decreased dorsiflexion - left, Ataxic General Gait Details: slow labored cadence with diffiuclty advancing RLE due to weakness, decreased proprioception, ataxic like movement, limited secondary to c/o fatigue Gait velocity: decreased    Posture / Balance Dynamic Sitting Balance Sitting balance -  Comments: fair/good seated at EOB Balance Overall balance assessment: Needs assistance Sitting-balance support: No upper extremity supported, Feet supported Sitting balance-Leahy Scale: Fair Sitting balance - Comments: fair/good seated at EOB Standing balance support: No upper extremity supported, During functional activity Standing balance-Leahy Scale: Poor Standing balance comment: fair/poor using RW mostly due to right hand slipping off RW    Special needs/care consideration Visitor , girlfriend, Carleene Cooper from bed acute hospital at [facility] early am 10/8   Previous Home Environment  Living Arrangements: Alone  Lives With: Alone Available Help at Discharge:  (girlfriend can assist) Type of Home: House Home Layout: One level Home Access: Stairs to enter Entrance Stairs-Rails: None Technical brewer of Steps: 2 Bathroom Shower/Tub: Multimedia programmer: Programmer, systems: Yes [place]: No  Discharge Living Setting Plans for Discharge Living Setting: Patient's home, Alone Type of Home at Discharge: House Discharge [place]: One level Discharge Home Access: Stairs to enter Entrance Stairs-Rails: None Entrance Stairs-Number of Steps: 2 Discharge Bathroom Shower/Tub: Walk-in shower Discharge Bathroom Toilet: Standard Discharge [place] Accessibility: Yes How Accessible: Accessible via walker Does the patient have any problems obtaining your medications?: No  Social/Family/Support Systems Contact Information: girlfriend, [name] Anticipated Caregiver: [name] Anticipated Caregiver's Contact Information: see above Ability/Limitations of Caregiver: [name] does not work Careers adviser: 24/7 Discharge Plan Discussed with Primary Caregiver: Yes Is Caregiver In Agreement with Plan?: Yes Does Caregiver/Family have Issues with Lodging/Transportation while Pt is in Rehab?: No  Goals Patient/Family Goal for Rehab: supervision to min with  PT and OT, supervision SLP Expected length of stay: [facility] 2 to 3 weeks Pt/Family Agrees to Admission and willing to participate: Yes Program Orientation Provided & Reviewed with Pt/Caregiver Including Roles  & Responsibilities: Yes  Decrease burden of Care through IP rehab admission: n/a  Possible need for SNF placement upon discharge: not anticipated  Patient Condition: I have reviewed medical records from [facility] , spoken with CM, and patient and family member. I discussed via phone for inpatient rehabilitation assessment.  Patient will benefit from ongoing PT, OT and SLP, can actively participate in 3 hours of therapy a day 5 days of the week, and can make measurable gains during the admission.  Patient will also benefit from the coordinated team approach during an Inpatient Acute Rehabilitation admission.  The patient will receive intensive therapy as well as Rehabilitation physician, nursing, social worker, and care management interventions.  Due to bladder management, bowel management, safety, skin/wound care, disease management, medication administration, pain management and patient education the patient requires 24 hour a day rehabilitation nursing.  The patient is currently mod assist overall with mobility and basic ADLs.  Discharge setting and therapy post discharge at home with home health is anticipated.  Patient has agreed to participate in the Acute Inpatient Rehabilitation Program and will admit today.  Preadmission Screen Completed By:  Cleatrice Burke, 11/17/2019 10:13 AM ______________________________________________________________________   Discussed status with Dr. Posey Pronto  on  11/17/2019 at  1014  and received approval for admission today.  Admission Coordinator:  Cleatrice Burke, RN, time 4174 Date  11/17/2019   Assessment/Plan: Diagnosis: Left PCA infarct 1. Does the need for close, 24 hr/day Medical supervision in concert with the patient's rehab needs  make it unreasonable for this patient to be served in a less intensive setting? Yes  2. 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 Due to safety, disease management, medication administration and patient education, does the patient require 24 hr/day rehab nursing? Yes 4. Does the patient require coordinated care of a physician, rehab nurse, PT, OT, and SLP to address physical and functional deficits in the context of the above medical diagnosis(es)? Yes Addressing deficits in the following areas: balance, endurance, locomotion, strength, transferring, bowel/bladder control, bathing, toileting, speech and psychosocial support 5. Can the patient actively participate in an intensive therapy program of at least 3 hrs of therapy 5 days a week? Yes 6. The potential for patient to make measurable gains while on inpatient rehab is excellent 7. Anticipated functional outcomes upon discharge from inpatient rehab: supervision and min assist PT, supervision and min assist OT, modified independent and supervision SLP 8. Estimated rehab length of stay to reach the above functional goals is: 10-14 days. 9. Anticipated discharge destination: Home 10. Overall Rehab/Functional Prognosis: good   MD Signature: Delice Lesch, MD, ABPMR

## 2019-11-16 NOTE — Evaluation (Signed)
Physical Therapy Evaluation Patient Details Name: Philip Stone MRN: [id number] DOB: [birth year] Today's Date: 11/16/2019   History of Present Illness  Jaishawn Stone is a 69 y.o. male with medical history significant for  Charcot-Marie-Tooth disease, right foot drop, high arched feet, neuropathy, cervical stenosis status post decompression who presents to the emergency department due to right-sided facial and visual symptoms over the last 3 days.  He was seen in the emergency department yesterday and MRI brain done at that time showed no stroke, neuro exam done was normal except for decreased sensation on the right side of his face, his symptoms were suspected to be due to history of coronary tooth disease, so patient was discharged from the ED and was referred to [facility] for further evaluation of his neuropathy.  Patient returned to the ED today with complaint of worsening right arm right leg numbness (new from yesterday), he continues to have right sided visual field defect, but speech was fine.    Clinical Impression  Patient required Max assist to don bilateral ankle braces due to right sided weakness and decreased proprioception and lack of visual field on right side.  Patient demonstrates labored movement for sitting up at bedside and standing requiring use of RW due to right sided weakness, has difficulty with voluntary movement of right side, but can move BUE/LE when attempting functional tasks, demonstrates ataxic like gait with c/o decreased awareness/proprioception of RLE when taking steps and limited mostly due to fatigue.  Patient will benefit from continued physical therapy in hospital and recommended venue below to increase strength, balance, endurance for safe ADLs and gait.    Follow Up Recommendations CIR    Equipment Recommendations  None recommended by PT    Recommendations for Other Services       Precautions / Restrictions Precautions Precautions:  Fall Required Braces or Orthoses: Other Brace Other Brace: bilateral AFO's in shoes Restrictions Weight Bearing Restrictions: No      Mobility  Bed Mobility Overal bed mobility: Needs Assistance Bed Mobility: Supine to Sit;Sit to Supine     Supine to sit: Min guard Sit to supine: Min guard   General bed mobility comments: increased time, labored movement  Transfers Overall transfer level: Needs assistance Equipment used: Rolling walker (2 wheeled);None Transfers: Sit to/from [employer] to Stand: Min assist Stand pivot transfers: Min assist;Mod assist       General transfer comment: neglict of right side, very unsteady with poor right handed grip strength to hold onto RW  Ambulation/Gait Ambulation/Gait assistance: Mod assist Gait Distance (Feet): 30 Feet Assistive device: Rolling walker (2 wheeled) Gait Pattern/deviations: Step-to pattern;Decreased step length - right;Decreased step length - left;Decreased stance time - right;Decreased dorsiflexion - right;Decreased dorsiflexion - left;Ataxic Gait velocity: decreased   General Gait Details: slow labored cadence with diffiuclty advancing RLE due to weakness, decreased proprioception, ataxic like movement, limited secondary to c/o fatigue  Stairs            Wheelchair Mobility    Modified Rankin (Stroke Patients Only)       Balance Overall balance assessment: Needs assistance Sitting-balance support: No upper extremity supported;Feet supported Sitting balance-Leahy Scale: Fair Sitting balance - Comments: fair/good seated at EOB   Standing balance support: No upper extremity supported;During functional activity Standing balance-Leahy Scale: Poor Standing balance comment: fair/poor using RW mostly due to right hand slipping off RW  Pertinent Vitals/Pain Pain Assessment: 0-10 Pain Score: 8  Pain Location: right side of bod from head to toes Pain  Descriptors / Indicators: Numbness Pain Intervention(s): Limited activity within patient's tolerance;Monitored during session    [place] expects to be discharged to:: Private residence Living Arrangements: Alone Available Help at Discharge: Family;Available 24 hours/day Type of Home: House Home Access: Stairs to enter Entrance Stairs-Rails: None Entrance Stairs-Number of Steps: 2 Home Layout: One level Home Equipment: Walker - 2 wheels;Cane - single point;Bedside commode;Shower seat      Prior Function Level of Independence: Independent with assistive device(s)         Comments: Hydrographic surveyor, drives, wears bilateral ankle braces     Hand Dominance   Dominant Hand: Right    Extremity/Trunk Assessment   Upper Extremity Assessment Upper Extremity Assessment: Defer to OT evaluation    Lower Extremity Assessment Lower Extremity Assessment: RLE deficits/detail;LLE deficits/detail RLE Deficits / Details: grossly -3/5 except ankle dorsiflexors 1/5 RLE Sensation: decreased light touch;decreased proprioception;history of peripheral neuropathy RLE Coordination: decreased fine motor LLE Deficits / Details: grossly 4+/5, except ankle dorsiflexors -2/5 LLE Sensation: WNL LLE Coordination: decreased fine motor    Cervical / Trunk Assessment Cervical / Trunk Assessment: Normal  Communication   Communication: No difficulties  Cognition Arousal/Alertness: Awake/alert Behavior During Therapy: WFL for tasks assessed/performed Overall Cognitive Status: Within Functional Limits for tasks assessed                                        General Comments      Exercises     Assessment/Plan    PT Assessment Patient needs continued PT services  PT Problem List Decreased strength;Decreased activity tolerance;Decreased balance;Decreased mobility;Decreased coordination;Decreased knowledge of use of DME;Impaired sensation       PT Treatment  Interventions DME instruction;Gait training;Stair training;Functional mobility training;Therapeutic activities;Therapeutic exercise;Balance training;Neuromuscular re-education;Patient/family education    PT Goals (Current goals can be found in the Care Plan section)  Acute Rehab PT Goals Patient Stated Goal: return home after rehab PT Goal Formulation: With patient Time For Goal Achievement: 11/30/19 Potential to Achieve Goals: Good    Frequency Min 1X/week   Barriers to discharge        Co-evaluation               AM-PAC PT "6 Clicks" Mobility  Outcome Measure Help needed turning from your back to your side while in a flat bed without using bedrails?: A Little Help needed moving from lying on your back to sitting on the side of a flat bed without using bedrails?: A Little Help needed moving to and from a bed to a chair (including a wheelchair)?: A Lot Help needed standing up from a chair using your arms (e.g., wheelchair or bedside chair)?: A Lot Help needed to walk in hospital room?: A Lot Help needed climbing 3-5 steps with a railing? : A Lot 6 Click Score: 14    End of Session   Activity Tolerance: Patient tolerated treatment well;Patient limited by fatigue Patient left: in bed;with call bell/phone within reach Nurse Communication: Mobility status PT Visit Diagnosis: Unsteadiness on feet (R26.81);Other abnormalities of gait and mobility (R26.89);Muscle weakness (generalized) (M62.81);Ataxic gait (R26.0);Hemiplegia and hemiparesis Hemiplegia - Right/Left: Right Hemiplegia - dominant/non-dominant: Dominant Hemiplegia - caused by: Cerebral infarction    Time: 8413-2440 PT Time Calculation (min) (ACUTE ONLY): 33 min   Charges:  PT Evaluation $PT Eval Moderate Complexity: 1 Mod PT Treatments $Therapeutic Activity: 23-37 mins        10:57 AM, 11/16/19 Lonell Grandchild, MPT Physical Therapist with [facility] [phone] office [phone] mobile  phone

## 2019-11-16 NOTE — Progress Notes (Signed)
PROGRESS NOTE  Philip Stone YFV:[id number] DOB: 04-18-1950 DOA: 11/15/2019 PCP: Claretta Fraise, MD  Brief History:  69 year old male with a history of Charcot-Marie-Tooth disease and right foot drop, cervical stenosis status post decompression, and GERD presenting with right face and right-sided numbness.  The patient states that his symptoms began on 11/13/2019 with dizziness and lightheadedness and right facial numbness as well as blurry vision on the right side.  His symptoms were waxing and waning at that time, but he visited the emergency department on 11/14/2019.  MRI of the brain on that day was negative for any acute findings.  The patient was subsequently discharged home in stable condition with instructions to follow-up with [facility] neurology.  His symptoms continue to progress with increasing right upper extremity and right lower extremity numbness and right-sided weakness.  He also complained of a bifrontal headache.  He denied any loss of consciousness.  as result, he represented to the emergency department on 11/15/2019.  He also complained of some word finding difficulties.  He denied any fevers, chills, chest pain, shortness of breath, coughing, hemoptysis, nausea, vomiting, diarrhea, abdominal pain, dysuria, hematuria. In the emergency department, the patient has been afebrile and hemodynamically stable without hypoxia.  BMP, LFTs, and CBC were essentially unremarkable except for thrombocytopenia.  Neurology was consulted to assist with management.  Assessment/Plan: Acute ischemic stroke -Neurology Consult -PT/OT evaluation -Speech therapy eval -10/6 MRI brain--acute ischemic left PCA infarct involving the left temporal occipital lobe and thalamus -10/6 MRA brain--interval occlusion of the left PCA.  Patent vertebral arteries.  Mild short segment stenosis left ICA -Echo-- -LDL--125 -HbA1C-- -Antiplatelet--start plavix given allergy to ASA  Thrombocytopenia -This appears  to be chronic -F63 -Folic acid -Monitor CBC and for signs of bleeding -TSH--1.635  Hyperlipidemia -start atorvastatin -LDL 125  Dysuria -Obtain UA  GERD -Continue PPI  Hypokalemia -Replete -Magnesium 2.0  Charcot-Marie-Tooth disease -PT evaluation -Outpatient follow-up       Status is: Observation  The patient remains OBS appropriate and will d/c before 2 midnights.  Dispo: The patient is from: Home              Anticipated d/c is to: Home              Anticipated d/c date is: 1 day              Patient currently is not medically stable to d/c.        Family Communication:  no Family at bedside  Consultants:  neurology  Code Status:  FULL  DVT Prophylaxis:  [place] Lovenox   Procedures: As Listed in Progress Note Above  Antibiotics: None       Subjective: Patient states that his right-sided numbness and weakness are about the same as the last few days.  He has a bifrontal headache.  He denies any worsening visual loss.  He denies any fevers, chills, chest pain, coughing, hemoptysis, shortness of breath, nausea, vomiting, diarrhea, domino pain.    Objective: Vitals:   11/16/19 0445 11/16/19 0500 11/16/19 0530 11/16/19 0600  BP:  (!) 161/87 (!) 152/79 (!) 150/91  Pulse: 63 71  79  Resp:      Temp:      TempSrc:      SpO2: 99% 98% 99% 100%  Weight:      Height:       No intake or output data in the 24 hours ending 11/16/19 0700 Weight change:  Exam:   General:  Pt is alert, follows commands appropriately, not in acute distress  HEENT: No icterus, No thrush, No neck mass, [place]/AT  Cardiovascular: RRR, S1/S2, no rubs, no gallops  Respiratory: CTA bilaterally, no wheezing, no crackles, no rhonchi  Abdomen: Soft/+BS, non tender, non distended, no guarding  Extremities: No edema, No lymphangitis, No petechiae, No rashes, no synovitis  Neuro:  CN II-XII intact, strength 4-/5 in RUE, RLE, strength 4+/5 LUE, LLE; sensation intact  bilateral; no dysmetria; babinski equivocal     Data Reviewed: I have personally reviewed following labs and imaging studies Basic Metabolic Panel: Recent Labs  Lab 11/14/19 1008 11/15/19 1741 11/16/19 0449  NA 137 137 139  K 3.9 3.4* 4.2  CL 104 103 106  CO2 26 23 24   GLUCOSE 93 102* 102*  BUN 13 13 14   CREATININE 0.88 0.93 0.92  CALCIUM 9.2 9.2 9.2  MG  --   --  2.0  PHOS  --   --  3.4   Liver Function Tests: Recent Labs  Lab 11/14/19 1008 11/16/19 0449  AST 24 24  ALT 24 22  ALKPHOS 77 76  BILITOT 0.9 1.2  PROT 7.1 7.2  ALBUMIN 4.2 4.1   No results for input(s): LIPASE, AMYLASE in the last 168 hours. No results for input(s): AMMONIA in the last 168 hours. Coagulation Profile: Recent Labs  Lab 11/16/19 0449  INR 1.2   CBC: Recent Labs  Lab 11/14/19 1008 11/15/19 1741 11/16/19 0449  WBC 4.9 6.3 7.5  NEUTROABS 2.9  --   --   HGB 14.8 15.2 15.4  HCT 42.6 44.0 44.1  MCV 85.9 85.9 85.5  PLT 121* 126* 139*   Cardiac Enzymes: No results for input(s): CKTOTAL, CKMB, CKMBINDEX, TROPONINI in the last 168 hours. BNP: Invalid input(s): POCBNP CBG: No results for input(s): GLUCAP in the last 168 hours. HbA1C: No results for input(s): HGBA1C in the last 72 hours. Urine analysis:    Component Value Date/Time   COLORURINE YELLOW 01/19/2018 1522   APPEARANCEUR CLEAR 01/19/2018 1522   LABSPEC 1.017 01/19/2018 1522   PHURINE 6.0 01/19/2018 1522   GLUCOSEU NEGATIVE 01/19/2018 1522   HGBUR NEGATIVE 01/19/2018 1522   BILIRUBINUR NEGATIVE 01/19/2018 1522   KETONESUR NEGATIVE 01/19/2018 1522   PROTEINUR NEGATIVE 01/19/2018 1522   NITRITE NEGATIVE 01/19/2018 1522   LEUKOCYTESUR NEGATIVE 01/19/2018 1522   Sepsis Labs: @LABRCNTIP (procalcitonin:4,lacticidven:4) ) Recent Results (from the past 240 hour(s))  Respiratory Panel by RT PCR (Flu A&B, Covid) - Nasopharyngeal Swab     Status: None   Collection Time: 11/15/19  9:55 PM   Specimen: Nasopharyngeal Swab    Result Value Ref Range Status   SARS Coronavirus 2 by RT PCR NEGATIVE NEGATIVE Final    Comment: (NOTE) SARS-CoV-2 target nucleic acids are NOT DETECTED.  The SARS-CoV-2 RNA is generally detectable in upper respiratoy specimens during the acute phase of infection. The lowest concentration of SARS-CoV-2 viral copies this assay can detect is 131 copies/mL. A negative result does not preclude SARS-Cov-2 infection and should not be used as the sole basis for treatment or other patient management decisions. A negative result may occur with  improper specimen collection/handling, submission of specimen other than nasopharyngeal swab, presence of viral mutation(s) within the areas targeted by this assay, and inadequate number of viral copies (<131 copies/mL). A negative result must be combined with clinical observations, patient history, and epidemiological information. The expected result is Negative.  Fact Sheet for Patients:  PinkCheek.be  Fact Sheet for Healthcare Providers:  GravelBags.it  This test is no t yet approved or cleared by the [place] FDA and  has been authorized for detection and/or diagnosis of SARS-CoV-2 by FDA under an Emergency Use Authorization (EUA). This EUA will remain  in effect (meaning this test can be used) for the duration of the COVID-19 declaration under Section 564(b)(1) of the Act, 21 U.S.C. section 360bbb-3(b)(1), unless the authorization is terminated or revoked sooner.     Influenza A by PCR NEGATIVE NEGATIVE Final   Influenza B by PCR NEGATIVE NEGATIVE Final    Comment: (NOTE) The Xpert Xpress SARS-CoV-2/FLU/RSV assay is intended as an aid in  the diagnosis of influenza from Nasopharyngeal swab specimens and  should not be used as a sole basis for treatment. Nasal washings and  aspirates are unacceptable for Xpert Xpress SARS-CoV-2/FLU/RSV  testing.  Fact Sheet for  Patients: PinkCheek.be  Fact Sheet for Healthcare Providers: GravelBags.it  This test is not yet approved or cleared by the [place] FDA and  has been authorized for detection and/or diagnosis of SARS-CoV-2 by  FDA under an Emergency Use Authorization (EUA). This EUA will remain  in effect (meaning this test can be used) for the duration of the  Covid-19 declaration under Section 564(b)(1) of the Act, 21  U.S.C. section 360bbb-3(b)(1), unless the authorization is  terminated or revoked. Performed at [facility], [address], [place], [place] [address]      Scheduled Meds: Continuous Infusions:  Procedures/Studies: MR ANGIO HEAD WO CONTRAST  Result Date: 11/15/2019 CLINICAL DATA:  Initial evaluation for acute neuro deficit, stroke suspected. EXAM: MRI HEAD WITHOUT CONTRAST MRA HEAD WITHOUT CONTRAST MRA NECK WITHOUT AND WITH CONTRAST TECHNIQUE: Multiplanar, multiecho pulse sequences of the brain and surrounding structures were obtained without intravenous contrast. Angiographic images of the Circle of Willis were obtained using MRA technique without intravenous contrast. Angiographic images of the neck were obtained using MRA technique without and with intravenous contrast. Carotid stenosis measurements (when applicable) are obtained utilizing NASCET criteria, using the distal internal carotid diameter as the denominator. CONTRAST:  14mL GADAVIST GADOBUTROL 1 MMOL/ML IV SOLN COMPARISON:  Recent MRI/MRA from 11/14/2019. FINDINGS: MRI HEAD FINDINGS Brain: There has been interval development of patchy restricted diffusion involving the parasagittal left temporal occipital region, compatible with an acute left PCA territory infarct, overall moderate in size. Involvement is greatest at the mesial left temporal lobe/left hippocampal formation. Mild patchy involvement of the left thalamus noted as well. Additional punctate cortical infarct  noted superiorly at the paramedian left parietal lobe (series 5, image thirty-five). No associated hemorrhage or mass effect. No other evidence for acute or interval infarction. Gray-white matter differentiation otherwise maintained. No susceptibility artifact to suggest acute or chronic intracranial hemorrhage. Underlying atrophy with moderate chronic microvascular ischemic disease again noted. No mass lesion, mass effect, or midline shift. No hydrocephalus or extra-axial fluid collection. Pituitary gland and suprasellar region normal. Midline structures intact. Vascular: Major intracranial vascular flow voids are maintained. Skull and upper cervical spine: Craniocervical junction within normal limits. Postsurgical changes partially visualize within the upper cervical spine. Bone marrow signal intensity normal. No scalp soft tissue abnormality. Sinuses/Orbits: Subtle flattening with T2 hypointense thickening noted at the posterolateral aspects of the globes bilaterally, right slightly greater than left (series 10, image 8), of uncertain significance. Globes and orbital soft tissues otherwise unremarkable. Scattered mucosal thickening noted within the frontoethmoidal sinuses. Paranasal sinuses are otherwise clear. No significant mastoid effusion. Inner ear structures grossly  normal. Other: None. MRA HEAD FINDINGS ANTERIOR CIRCULATION: Distal cervical segments of the internal carotid arteries are widely patent with antegrade flow. Mild atheromatous irregularity throughout the carotid siphons without hemodynamically significant stenosis or other abnormality. A1 segments, anterior communicating artery complex, and anterior cerebral arteries widely patent and within normal limits. No M1 stenosis or occlusion. Distal MCA branches well perfused and symmetric. POSTERIOR CIRCULATION: Vertebral arteries widely patent to the vertebrobasilar junction. Left vertebral artery strongly dominant, with a diffusely hypoplastic right  vertebral artery. Both picas patent. Basilar widely patent to its distal aspect. Superior cerebral arteries patent bilaterally. Right PCA supplied via the basilar remains widely patent. Interval occlusion of the left PCA since previous exam. Some attenuated flow is seen within the left posterior communicating artery. No intracranial aneurysm. MRA NECK FINDINGS AORTIC ARCH: Visualized aortic arch of normal caliber. Bovine branching pattern with common origin of the innominate and left common carotid arteries noted. No hemodynamically significant stenosis seen about the origin of the great vessels. RIGHT CAROTID SYSTEM: Right CCA widely patent from its origin to the bifurcation without stenosis. No significant atheromatous narrowing about the right bifurcation. Right ICA widely patent to the skull base without stenosis or other abnormality. LEFT CAROTID SYSTEM: Left CCA widely patent from its origin to the bifurcation without stenosis. No more than mild short-segment 20% stenosis at the origin of the left ICA. Left ICA otherwise widely patent distally without stenosis or other vascular abnormality. VERTEBRAL ARTERIES: Both vertebral arteries arise from subclavian arteries. No proximal subclavian artery stenosis. Left vertebral artery strongly dominant, with a diffusely hypoplastic right vertebral artery. Both vertebral arteries widely patent within the neck without stenosis or occlusion. IMPRESSION: MRI HEAD IMPRESSION: 1. 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 No associated mass effect. 2. Underlying atrophy with moderate chronic microvascular ischemic disease. 3. Subtle flattening with T2 hypointense thickening at the posterolateral aspects of the globes bilaterally, of uncertain significance. Correlation with history and funduscopic exam suggested. MRA HEAD IMPRESSION: 1.  Interval occlusion of the left PCA since previous exam. 2. Otherwise stable intracranial MRA. No other large vessel occlusion. No other hemodynamically significant or correctable stenosis. MRA NECK IMPRESSION: 1. No more than mild short-segment stenosis at the origin of the left ICA. Otherwise wide patency of both carotid artery systems within the neck. 2. Both vertebral arteries widely patent within the neck. Left vertebral artery strongly dominant, with a diffusely hypoplastic right vertebral artery. Electronically Signed   By: Jeannine Boga M.D.   On: 11/15/2019 20:13   MR ANGIO HEAD WO CONTRAST  Result Date: 11/14/2019 CLINICAL DATA:  Right facial numbness, blurry vision EXAM: MRI HEAD WITHOUT CONTRAST MRA HEAD WITHOUT CONTRAST TECHNIQUE: Multiplanar, multiecho pulse sequences of the brain and surrounding structures were obtained without intravenous contrast. Angiographic images of the head were obtained using MRA technique without contrast. COMPARISON:  None. FINDINGS: MRI HEAD Brain: There is no acute infarction or intracranial hemorrhage. There is no intracranial mass, mass effect, or edema. There is no hydrocephalus or extra-axial fluid collection. Ventricles and sulci are within normal limits in size and configuration. Patchy foci of T2 hyperintensity in the supratentorial white matter are nonspecific but may reflect mild to moderate chronic microvascular ischemic changes in a patient of this age. 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 Vascular: Major vessel flow voids at the skull base are preserved. Skull  and upper cervical spine: Normal marrow signal is preserved. Sinuses/Orbits: Paranasal sinuses are aerated. Orbits are unremarkable. Other: Sella is unremarkable.  Mastoid air cells are clear. MRA HEAD Degraded by artifact. Intracranial internal carotid arteries are patent. Middle and anterior cerebral arteries are patent. Visual intracranial  vertebral arteries are patent. The basilar artery is patent. The posterior cerebral arteries are patent. There is fetal origin of the left PCA. 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 There is no aneurysm. IMPRESSION: No acute infarction, hemorrhage, or mass. Moderate chronic microvascular ischemic changes. 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 Electronically Signed   By: Macy Mis M.D.   On: 11/14/2019 13:38   MR ANGIO NECK W WO CONTRAST  Result Date: 11/15/2019 CLINICAL DATA:  Initial evaluation for acute neuro deficit, stroke suspected. EXAM: MRI HEAD WITHOUT CONTRAST MRA HEAD WITHOUT CONTRAST MRA NECK WITHOUT AND WITH CONTRAST TECHNIQUE: Multiplanar, multiecho pulse sequences of the brain and surrounding structures were obtained without intravenous contrast. Angiographic images of the Circle of Willis were obtained using MRA technique without intravenous contrast. Angiographic images of the neck were obtained using MRA technique without and with intravenous contrast. Carotid stenosis measurements (when applicable) are obtained utilizing NASCET criteria, using the distal internal carotid diameter as the denominator. CONTRAST:  60mL GADAVIST GADOBUTROL 1 MMOL/ML IV SOLN COMPARISON:  Recent MRI/MRA from 11/14/2019. FINDINGS: MRI HEAD FINDINGS Brain: There has been interval development of patchy restricted diffusion involving the parasagittal left temporal occipital region, compatible with an acute left PCA territory infarct, overall moderate in size. Involvement is greatest at the mesial left temporal lobe/left hippocampal formation. Mild patchy involvement of the left thalamus noted as well. Additional punctate cortical infarct noted superiorly at the paramedian left parietal lobe (series 5, image thirty-five). No associated hemorrhage or mass effect. No other evidence for acute or interval infarction. Gray-white matter differentiation otherwise maintained. No  susceptibility artifact to suggest acute or chronic intracranial hemorrhage. Underlying atrophy with moderate chronic microvascular ischemic disease again noted. No mass lesion, mass effect, or midline shift. No hydrocephalus or extra-axial fluid collection. Pituitary gland and suprasellar region normal. Midline structures intact. Vascular: Major intracranial vascular flow voids are maintained. Skull and upper cervical spine: Craniocervical junction within normal limits. Postsurgical changes partially visualize within the upper cervical spine. Bone marrow signal intensity normal. No scalp soft tissue abnormality. Sinuses/Orbits: Subtle flattening with T2 hypointense thickening noted at the posterolateral aspects of the globes bilaterally, right slightly greater than left (series 10, image 8), of uncertain significance. Globes and orbital soft tissues otherwise unremarkable. Scattered mucosal thickening noted within the frontoethmoidal sinuses. Paranasal sinuses are otherwise clear. No significant mastoid effusion. Inner ear structures grossly normal. Other: None. MRA HEAD FINDINGS ANTERIOR CIRCULATION: Distal cervical segments of the internal carotid arteries are widely patent with antegrade flow. Mild atheromatous irregularity throughout the carotid siphons without hemodynamically significant stenosis or other abnormality. A1 segments, anterior communicating artery complex, and anterior cerebral arteries widely patent and within normal limits. No M1 stenosis or occlusion. Distal MCA branches well perfused and symmetric. POSTERIOR CIRCULATION: Vertebral arteries widely patent to the vertebrobasilar junction. Left vertebral artery strongly dominant, with a diffusely hypoplastic right vertebral artery. Both picas patent. Basilar widely patent to its distal aspect. Superior cerebral arteries patent bilaterally. Right PCA supplied via the basilar remains widely patent. Interval occlusion of the left PCA since previous  exam. Some attenuated flow is seen within the left posterior communicating artery. No intracranial aneurysm. MRA NECK  FINDINGS AORTIC ARCH: Visualized aortic arch of normal caliber. Bovine branching pattern with common origin of the innominate and left common carotid arteries noted. No hemodynamically significant stenosis seen about the origin of the great vessels. RIGHT CAROTID SYSTEM: Right CCA widely patent from its origin to the bifurcation without stenosis. No significant atheromatous narrowing about the right bifurcation. Right ICA widely patent to the skull base without stenosis or other abnormality. LEFT CAROTID SYSTEM: Left CCA widely patent from its origin to the bifurcation without stenosis. No more than mild short-segment 20% stenosis at the origin of the left ICA. Left ICA otherwise widely patent distally without stenosis or other vascular abnormality. VERTEBRAL ARTERIES: Both vertebral arteries arise from subclavian arteries. No proximal subclavian artery stenosis. Left vertebral artery strongly dominant, with a diffusely hypoplastic right vertebral artery. Both vertebral arteries widely patent within the neck without stenosis or occlusion. IMPRESSION: MRI HEAD IMPRESSION: 1. 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 No associated mass effect. 2. Underlying atrophy with moderate chronic microvascular ischemic disease. 3. Subtle flattening with T2 hypointense thickening at the posterolateral aspects of the globes bilaterally, of uncertain significance. Correlation with history and funduscopic exam suggested. MRA HEAD IMPRESSION: 1. Interval occlusion of the left PCA since previous exam. 2. Otherwise stable intracranial MRA. No other large vessel occlusion. No other hemodynamically significant or correctable stenosis. MRA NECK IMPRESSION: 1. No more than mild  short-segment stenosis at the origin of the left ICA. Otherwise wide patency of both carotid artery systems within the neck. 2. Both vertebral arteries widely patent within the neck. Left vertebral artery strongly dominant, with a diffusely hypoplastic right vertebral artery. Electronically Signed   By: Jeannine Boga M.D.   On: 11/15/2019 20:13   MR BRAIN WO CONTRAST  Result Date: 11/15/2019 CLINICAL DATA:  Initial evaluation for acute neuro deficit, stroke suspected. EXAM: MRI HEAD WITHOUT CONTRAST MRA HEAD WITHOUT CONTRAST MRA NECK WITHOUT AND WITH CONTRAST TECHNIQUE: Multiplanar, multiecho pulse sequences of the brain and surrounding structures were obtained without intravenous contrast. Angiographic images of the Circle of Willis were obtained using MRA technique without intravenous contrast. Angiographic images of the neck were obtained using MRA technique without and with intravenous contrast. Carotid stenosis measurements (when applicable) are obtained utilizing NASCET criteria, using the distal internal carotid diameter as the denominator. CONTRAST:  77mL GADAVIST GADOBUTROL 1 MMOL/ML IV SOLN COMPARISON:  Recent MRI/MRA from 11/14/2019. FINDINGS: MRI HEAD FINDINGS Brain: There has been interval development of patchy restricted diffusion involving the parasagittal left temporal occipital region, compatible with an acute left PCA territory infarct, overall moderate in size. Involvement is greatest at the mesial left temporal lobe/left hippocampal formation. Mild patchy involvement of the left thalamus noted as well. Additional punctate cortical infarct noted superiorly at the paramedian left parietal lobe (series 5, image thirty-five). No associated hemorrhage or mass effect. No other evidence for acute or interval infarction. Gray-white matter differentiation otherwise maintained. No susceptibility artifact to suggest acute or chronic intracranial hemorrhage. Underlying atrophy with moderate chronic  microvascular ischemic disease again noted. No mass lesion, mass effect, or midline shift. No hydrocephalus or extra-axial fluid collection. Pituitary gland and suprasellar region normal. Midline structures intact. Vascular: Major intracranial vascular flow voids are maintained. Skull and upper cervical spine: Craniocervical junction within normal limits. Postsurgical changes partially visualize within the upper cervical spine. Bone marrow signal intensity normal. No scalp soft tissue abnormality. Sinuses/Orbits: Subtle  flattening with T2 hypointense thickening noted at the posterolateral aspects of the globes bilaterally, right slightly greater than left (series 10, image 8), of uncertain significance. Globes and orbital soft tissues otherwise unremarkable. Scattered mucosal thickening noted within the frontoethmoidal sinuses. Paranasal sinuses are otherwise clear. No significant mastoid effusion. Inner ear structures grossly normal. Other: None. MRA HEAD FINDINGS ANTERIOR CIRCULATION: Distal cervical segments of the internal carotid arteries are widely patent with antegrade flow. Mild atheromatous irregularity throughout the carotid siphons without hemodynamically significant stenosis or other abnormality. A1 segments, anterior communicating artery complex, and anterior cerebral arteries widely patent and within normal limits. No M1 stenosis or occlusion. Distal MCA branches well perfused and symmetric. POSTERIOR CIRCULATION: Vertebral arteries widely patent to the vertebrobasilar junction. Left vertebral artery strongly dominant, with a diffusely hypoplastic right vertebral artery. Both picas patent. Basilar widely patent to its distal aspect. Superior cerebral arteries patent bilaterally. Right PCA supplied via the basilar remains widely patent. Interval occlusion of the left PCA since previous exam. Some attenuated flow is seen within the left posterior communicating artery. No intracranial aneurysm. MRA NECK  FINDINGS AORTIC ARCH: Visualized aortic arch of normal caliber. Bovine branching pattern with common origin of the innominate and left common carotid arteries noted. No hemodynamically significant stenosis seen about the origin of the great vessels. RIGHT CAROTID SYSTEM: Right CCA widely patent from its origin to the bifurcation without stenosis. No significant atheromatous narrowing about the right bifurcation. Right ICA widely patent to the skull base without stenosis or other abnormality. LEFT CAROTID SYSTEM: Left CCA widely patent from its origin to the bifurcation without stenosis. No more than mild short-segment 20% stenosis at the origin of the left ICA. Left ICA otherwise widely patent distally without stenosis or other vascular abnormality. VERTEBRAL ARTERIES: Both vertebral arteries arise from subclavian arteries. No proximal subclavian artery stenosis. Left vertebral artery strongly dominant, with a diffusely hypoplastic right vertebral artery. Both vertebral arteries widely patent within the neck without stenosis or occlusion. IMPRESSION: MRI HEAD IMPRESSION: 1. 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 No associated mass effect. 2. Underlying atrophy with moderate chronic microvascular ischemic disease. 3. Subtle flattening with T2 hypointense thickening at the posterolateral aspects of the globes bilaterally, of uncertain significance. Correlation with history and funduscopic exam suggested. MRA HEAD IMPRESSION: 1. Interval occlusion of the left PCA since previous exam. 2. Otherwise stable intracranial MRA. No other large vessel occlusion. No other hemodynamically significant or correctable stenosis. MRA NECK IMPRESSION: 1. No more than mild short-segment stenosis at the origin of the left ICA. Otherwise wide patency of both carotid artery systems within the  neck. 2. Both vertebral arteries widely patent within the neck. Left vertebral artery strongly dominant, with a diffusely hypoplastic right vertebral artery. Electronically Signed   By: Jeannine Boga M.D.   On: 11/15/2019 20:13   MR BRAIN WO CONTRAST  Result Date: 11/14/2019 CLINICAL DATA:  Right facial numbness, blurry vision EXAM: MRI HEAD WITHOUT CONTRAST MRA HEAD WITHOUT CONTRAST TECHNIQUE: Multiplanar, multiecho pulse sequences of the brain and surrounding structures were obtained without intravenous contrast. Angiographic images of the head were obtained using MRA technique without contrast. COMPARISON:  None. FINDINGS: MRI HEAD Brain: There is no acute infarction or intracranial hemorrhage. There is no intracranial mass, mass effect, or edema. There is no hydrocephalus or extra-axial fluid collection. Ventricles and sulci are within normal limits in size and configuration.  Patchy foci of T2 hyperintensity in the supratentorial white matter are nonspecific but may reflect mild to moderate chronic microvascular ischemic changes in a patient of this age. 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 Vascular: Major vessel flow voids at the skull base are preserved. Skull and upper cervical spine: Normal marrow signal is preserved. Sinuses/Orbits: Paranasal sinuses are aerated. Orbits are unremarkable. Other: Sella is unremarkable.  Mastoid air cells are clear. MRA HEAD Degraded by artifact. Intracranial internal carotid arteries are patent. Middle and anterior cerebral arteries are patent. Visual intracranial vertebral arteries are patent. The basilar artery is patent. The posterior cerebral arteries are patent. There is fetal origin of the left PCA. 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 There is no aneurysm. IMPRESSION: No acute infarction, hemorrhage, or mass. Moderate chronic microvascular ischemic changes. 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 Electronically Signed   By: Macy Mis M.D.   On: 11/14/2019 13:38    Orson Eva, DO  [facility]  If 7PM-7AM, please contact night-coverage [email or web address] Password [email or web address] 11/16/2019, 7:00 AM   LOS: 0 days

## 2019-11-16 NOTE — Progress Notes (Signed)
Patient's BP 167/85. Provider notified.   11/16/19 1539  Assess: MEWS Score  Temp 98.8 F (37.1 C)  BP (!) 167/85  Pulse Rate 77  Resp 18  Level of Consciousness Alert  SpO2 98 %  O2 Device Room Air  Assess: MEWS Score  MEWS Temp 0  MEWS Systolic 0  MEWS Pulse 0  MEWS RR 0  MEWS LOC 0  MEWS Score 0  MEWS Score Color Green

## 2019-11-16 NOTE — Plan of Care (Signed)
  Problem: SLP Cognition Goals Goal: Patient will utilize external memory aids Description: Patient will utilize external memory aids to facilitate recall of information for improved safety with Flowsheets (Taken 11/16/2019 1446) Patient will utilize external memory aids to facilitate recall of ____ information for improved safety with ______:  basic  modified independence   Problem: SLP Language Goals Goal: Patient will communicate needs/wants with Flowsheets (Taken 11/16/2019 1446) Patient will communicate ____  needs/wants with:  basic  min assist   Thank you,  Genene Churn, CCC-SLP [phone]

## 2019-11-16 NOTE — Consult Note (Signed)
[place] A. Merlene Laughter, MD     [email or web address]          Philip Stone is an 68 y.o. male.   ASSESSMENT/PLAN: 1.  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  Risk factors age.  Typically speaking dual antiplatelet agents would be recommended but given the patient's anaphylaxis to aspirin Plavix is recommended.  Statin optimization is also recommended.  Physical, occupational and speech therapies are recommended.  The patient's is cautioned about driving given the impaired vision on the right. 2.  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  Left enlarged lymph node which seems to be increasing per history CT of the neck with and without contrast is referred.  This was referred to the hospitalist. 4.  Mild thrombocytopenia  This is a 69 year old right-handed white male who presented with left facial numbness and visual problems on Sunday after church.  It appeared that he had some fluctuating symptoms and decided seek medical attention.  He did come to the emergency room and had extensive imaging which showed high-grade stenosis of the left PCA but no acute ischemic stroke.  The patient presented 2 days later with more persistent symptoms with his vision and the right hemiparesis.  He reports that his symptoms were associated with some occasional occipital headaches.  He speech seems mildly dysarthric and that they report this is new.  No reports of chest pain or palpitations are reported.  No dyspnea is reported.  He does have significant neuropathy at baseline with a right foot drop and sees one of the neurologist in [place].  The review of systems otherwise negative.    GENERAL: This a pleasant thin male who is somewhat anxious but no acute distress.  HEENT: Neck is supple and no trauma  noted.  ABDOMEN: soft  EXTREMITIES: No edema; he has significant hammertoes and high arches bilaterally especially on the right.  There is marked arthritic changes of the knees bilaterally especially on the right.  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  BACK: Normal  SKIN: Normal by inspection.    MENTAL STATUS: He is awake and alert and cooperates with evaluation.  He does state his name and the month correctly.  Comprehension is good but he clearly has difficulties with naming and slightly reduced fluency (1).  Was only able to name 1 object on bedside testing and had significant perseveration (2).  There is mild dysarthria noted (1).  CRANIAL NERVES: Pupils are equal, round and reactive to light and accomodation; extra ocular movements are full, there is no significant nystagmus; visual fields are full -dense right homonymous hemianopia (2); upper and lower facial muscles are normal in strength and symmetric, there is no flattening of the nasolabial folds; tongue is midline; uvula is midline; shoulder elevation is normal.  MOTOR: There is significant atrophy of intrinsic hand muscles bilaterally.  There is bilateral foot drop more on the right.  There is atrophy of the calf muscles bilaterally especially on the right and especially the tibialis anterior muscle.  Right deltoid 4 -, triceps 1/5 hand movements 0/5.  Pronounced drift of the right upper extremity (2).  Right hip flexion 3/5 and dorsiflexion 0/5 with significant drift (2).  Left deltoid 4+/5 and hand 4 -/5 with no drift.  Left hip flexion 4+/5 and dorsiflexion 2/5.  No drift of the left lower extremity.  COORDINATION:   REFLEXES: Diminished reflexes right lower extremity preserved left knee jerk.  SENSATION: Markedly reduced sensation to pinprick, light touch and pain right lower extremity and the moderate to his right upper extremity (2).  NIH stroke scale of 12.       Blood pressure (!) 156/86, pulse 68,  temperature 97.9 F (36.6 C), temperature source Oral, resp. rate 18, height 5\' 10"  (1.778 m), weight 81.6 kg, SpO2 100 %.  Past Medical History:  Diagnosis Date  . Arthritis   . BPH (benign prostatic hyperplasia)   . Charcot-Marie-Tooth disease   . Foot drop, right   . Neuropathy   . Platelets decreased ([place])   . Spinal stenosis     Past Surgical History:  Procedure Laterality Date  . CERVICAL DISC SURGERY  10 YEARS AGO   reports there are plates and screws in place   . FOOT ARTHROPLASTY    . INGUINAL HERNIA REPAIR Right 06/15/2014   Procedure: RIGHT INGUINAL HERNIORRHAPHY  WITH MESH;  Surgeon: Aviva Signs Md, MD;  Location: [facility];  Service: General;  Laterality: Right;  . INSERTION OF MESH Right 06/15/2014   Procedure: INSERTION OF MESH RIGHT INGUINAL HERNIORRHAPHY;  Surgeon: Aviva Signs Md, MD;  Location: [facility];  Service: General;  Laterality: Right;  . KNEE ARTHROSCOPY Right   . TOTAL KNEE ARTHROPLASTY Right 01/21/2018   Procedure: TOTAL KNEE ARTHROPLASTY;  Surgeon: Sydnee Cabal, MD;  Location: [facility];  Service: Orthopedics;  Laterality: Right;    Family History  Problem Relation Age of Onset  . Suicidality Mother   . Other Father        "old age"    Social History:  reports that he quit smoking about 13 years ago. His smoking use included cigarettes. He started smoking about 30 years ago. He has a 18.00 pack-year smoking history. He has never used smokeless tobacco. He reports that he does not drink alcohol and does not use drugs.  Allergies:  Allergies  Allergen Reactions  . Asa [Aspirin] Anaphylaxis  . Prednisone     Makes him lightheaded    Medications: Prior to Admission medications   Medication Sig Start Date End Date Taking? Authorizing Provider  pantoprazole (PROTONIX) 40 MG tablet Take 1 tablet (40 mg total) by mouth daily. 11/06/19  Yes Stacks, Cletus Gash, MD  tamsulosin (FLOMAX) 0.4 MG CAPS capsule TAKE TWO CAPSULES BY MOUTH DAILY AT BEDTIME Patient  taking differently: Take 0.8 mg by mouth at bedtime.  11/06/19  Yes Claretta Fraise, MD    Scheduled Meds: . atorvastatin  40 mg Oral Daily  . clopidogrel  75 mg Oral Daily  . enoxaparin (LOVENOX) injection  40 mg Subcutaneous Q24H  . pantoprazole  40 mg Oral QAC breakfast   Continuous Infusions: PRN Meds:.     Results for orders placed or performed during the hospital encounter of 11/15/19 (from the past 48 hour(s))  CBC     Status: Abnormal   Collection Time: 11/15/19  5:41 PM  Result Value Ref Range   WBC 6.3 4.0 - 10.5 K/uL   RBC 5.12 4.22 - 5.81 MIL/uL   Hemoglobin 15.2 13.0 - 17.0 g/dL   HCT 44.0 39 - 52 %   MCV 85.9 80.0 - 100.0 fL   MCH 29.7 26.0 - 34.0 pg   MCHC 34.5 30.0 - 36.0 g/dL   RDW 13.2 11.5 - 15.5 %   Platelets 126 (L) 150 - 400 K/uL   nRBC 0.0 0.0 - 0.2 %  Comment: Performed at [facility], [address], [place], [place] [address]  Basic metabolic panel     Status: Abnormal   Collection Time: 11/15/19  5:41 PM  Result Value Ref Range   Sodium 137 135 - 145 mmol/L   Potassium 3.4 (L) 3.5 - 5.1 mmol/L   Chloride 103 98 - 111 mmol/L   CO2 23 22 - 32 mmol/L   Glucose, Bld 102 (H) 70 - 99 mg/dL    Comment: Glucose reference range applies only to samples taken after fasting for at least 8 hours.   BUN 13 8 - 23 mg/dL   Creatinine, Ser 0.93 0.61 - 1.24 mg/dL   Calcium 9.2 8.9 - 10.3 mg/dL   GFR calc non Af Amer >60 >60 mL/min   Anion gap 11 5 - 15    Comment: Performed at [facility], [address] [place], [place] [address]  Respiratory Panel by RT PCR (Flu A&B, Covid) - Nasopharyngeal Swab     Status: None   Collection Time: 11/15/19  9:55 PM   Specimen: Nasopharyngeal Swab  Result Value Ref Range   SARS Coronavirus 2 by RT PCR NEGATIVE NEGATIVE    Comment: (NOTE) SARS-CoV-2 target nucleic acids are NOT DETECTED.  The SARS-CoV-2 RNA is generally detectable in upper respiratoy specimens during the acute phase of infection. The  lowest concentration of SARS-CoV-2 viral copies this assay can detect is 131 copies/mL. A negative result does not preclude SARS-Cov-2 infection and should not be used as the sole basis for treatment or other patient management decisions. A negative result may occur with  improper specimen collection/handling, submission of specimen other than nasopharyngeal swab, presence of viral mutation(s) within the areas targeted by this assay, and inadequate number of viral copies (<131 copies/mL). A negative result must be combined with clinical observations, patient history, and epidemiological information. The expected result is Negative.  Fact Sheet for Patients:  PinkCheek.be  Fact Sheet for Healthcare Providers:  GravelBags.it  This test is no t yet approved or cleared by the [place] FDA and  has been authorized for detection and/or diagnosis of SARS-CoV-2 by FDA under an Emergency Use Authorization (EUA). This EUA will remain  in effect (meaning this test can be used) for the duration of the COVID-19 declaration under Section 564(b)(1) of the Act, 21 U.S.C. section 360bbb-3(b)(1), unless the authorization is terminated or revoked sooner.     Influenza A by PCR NEGATIVE NEGATIVE   Influenza B by PCR NEGATIVE NEGATIVE    Comment: (NOTE) The Xpert Xpress SARS-CoV-2/FLU/RSV assay is intended as an aid in  the diagnosis of influenza from Nasopharyngeal swab specimens and  should not be used as a sole basis for treatment. Nasal washings and  aspirates are unacceptable for Xpert Xpress SARS-CoV-2/FLU/RSV  testing.  Fact Sheet for Patients: PinkCheek.be  Fact Sheet for Healthcare Providers: GravelBags.it  This test is not yet approved or cleared by the [place] FDA and  has been authorized for detection and/or diagnosis of SARS-CoV-2 by  FDA under an Emergency  Use Authorization (EUA). This EUA will remain  in effect (meaning this test can be used) for the duration of the  Covid-19 declaration under Section 564(b)(1) of the Act, 21  U.S.C. section 360bbb-3(b)(1), unless the authorization is  terminated or revoked. Performed at [facility], [address], [place], [place] [address]   Comprehensive metabolic panel     Status: Abnormal   Collection Time: 11/16/19  4:49 AM  Result Value  Ref Range   Sodium 139 135 - 145 mmol/L   Potassium 4.2 3.5 - 5.1 mmol/L    Comment: DELTA CHECK NOTED   Chloride 106 98 - 111 mmol/L   CO2 24 22 - 32 mmol/L   Glucose, Bld 102 (H) 70 - 99 mg/dL    Comment: Glucose reference range applies only to samples taken after fasting for at least 8 hours.   BUN 14 8 - 23 mg/dL   Creatinine, Ser 0.92 0.61 - 1.24 mg/dL   Calcium 9.2 8.9 - 10.3 mg/dL   Total Protein 7.2 6.5 - 8.1 g/dL   Albumin 4.1 3.5 - 5.0 g/dL   AST 24 15 - 41 U/L   ALT 22 0 - 44 U/L   Alkaline Phosphatase 76 38 - 126 U/L   Total Bilirubin 1.2 0.3 - 1.2 mg/dL   GFR calc non Af Amer >60 >60 mL/min   Anion gap 9 5 - 15    Comment: Performed at [facility], [address] [place], [place] [address]  CBC     Status: Abnormal   Collection Time: 11/16/19  4:49 AM  Result Value Ref Range   WBC 7.5 4.0 - 10.5 K/uL   RBC 5.16 4.22 - 5.81 MIL/uL   Hemoglobin 15.4 13.0 - 17.0 g/dL   HCT 44.1 39 - 52 %   MCV 85.5 80.0 - 100.0 fL   MCH 29.8 26.0 - 34.0 pg   MCHC 34.9 30.0 - 36.0 g/dL   RDW 13.2 11.5 - 15.5 %   Platelets 139 (L) 150 - 400 K/uL   nRBC 0.0 0.0 - 0.2 %    Comment: Performed at [facility], [address], [place], [place] [address]  Protime-INR     Status: None   Collection Time: 11/16/19  4:49 AM  Result Value Ref Range   Prothrombin Time 14.7 11.4 - 15.2 seconds   INR 1.2 0.8 - 1.2    Comment: (NOTE) INR goal varies based on device and disease states. Performed at [facility], [address] [place], [place] [address]   APTT      Status: None   Collection Time: 11/16/19  4:49 AM  Result Value Ref Range   aPTT 32 24 - 36 seconds    Comment: Performed at [facility], [address], [place], [place] [address]  Magnesium     Status: None   Collection Time: 11/16/19  4:49 AM  Result Value Ref Range   Magnesium 2.0 1.7 - 2.4 mg/dL    Comment: Performed at [facility], [address] [place], [place] [address]  Phosphorus     Status: None   Collection Time: 11/16/19  4:49 AM  Result Value Ref Range   Phosphorus 3.4 2.5 - 4.6 mg/dL    Comment: Performed at [facility], [address], [place], [place] [address]  Lipid panel     Status: Abnormal   Collection Time: 11/16/19  4:49 AM  Result Value Ref Range   Cholesterol 186 0 - 200 mg/dL   Triglycerides 44 <150 mg/dL   HDL 52 >40 mg/dL   Total CHOL/HDL Ratio 3.6 RATIO   VLDL 9 0 - 40 mg/dL   LDL Cholesterol 125 (H) 0 - 99 mg/dL    Comment:        Total Cholesterol/HDL:CHD Risk Coronary Heart Disease Risk Table                     Men  Women  1/2 Average Risk   3.4   3.3  Average Risk       5.0   4.4  2 X Average Risk   9.6   7.1  3 X Average Risk  23.4   11.0        Use the calculated Patient Ratio above and the CHD Risk Table to determine the patient's CHD Risk.        ATP III CLASSIFICATION (LDL):  <100     mg/dL   Optimal  100-129  mg/dL   Near or Above                    Optimal  130-159  mg/dL   Borderline  160-189  mg/dL   High  >190     mg/dL   Very High Performed at [facility] [place], [place] [address]   Hemoglobin A1c     Status: None   Collection Time: 11/16/19  4:49 AM  Result Value Ref Range   Hgb A1c MFr Bld 5.6 4.8 - 5.6 %    Comment: (NOTE) Pre diabetes:          5.7%-6.4%  Diabetes:              >6.4%  Glycemic control for   <7.0% adults with diabetes    Mean Plasma Glucose 114.02 mg/dL    Comment: Performed at [facility] [address] [place], [place] [address]  TSH     Status: None    Collection Time: 11/16/19  4:49 AM  Result Value Ref Range   TSH 1.635 0.350 - 4.500 uIU/mL    Comment: Performed by a 3rd Generation assay with a functional sensitivity of <=0.01 uIU/mL. Performed at [facility], [address] [place], [place] [address]   Vitamin B12     Status: None   Collection Time: 11/16/19  4:49 AM  Result Value Ref Range   Vitamin B-12 305 180 - 914 pg/mL    Comment: (NOTE) This assay is not validated for testing neonatal or myeloproliferative syndrome specimens for Vitamin B12 levels. Performed at [facility], [address], [place], [place] [address]   Folate     Status: None   Collection Time: 11/16/19  4:49 AM  Result Value Ref Range   Folate 32.4 >5.9 ng/mL    Comment: RESULTS CONFIRMED BY MANUAL DILUTION Performed at [facility], [address], [place], [place] [address]     Studies/Results:   MRI MRA BRAIN MRA NECK FINDINGS: MRI HEAD FINDINGS  Brain: There has been interval development of patchy restricted diffusion involving the parasagittal left temporal occipital region, compatible with an acute left PCA territory infarct, overall moderate in size. Involvement is greatest at the mesial left temporal lobe/left hippocampal formation. Mild patchy involvement of the left thalamus noted as well. Additional punctate cortical infarct noted superiorly at the paramedian left parietal lobe (series 5, image thirty-five). No associated hemorrhage or mass effect.  No other evidence for acute or interval infarction. Gray-white matter differentiation otherwise maintained. No susceptibility artifact to suggest acute or chronic intracranial hemorrhage.  Underlying atrophy with moderate chronic microvascular ischemic disease again noted. No mass lesion, mass effect, or midline shift. No hydrocephalus or extra-axial fluid collection. Pituitary gland and suprasellar region normal. Midline structures intact.  Vascular: Major intracranial vascular  flow voids are maintained.  Skull and upper cervical spine: Craniocervical junction within normal limits. Postsurgical changes partially visualize within  the upper cervical spine. Bone marrow signal intensity normal. No scalp soft tissue abnormality.  Sinuses/Orbits: Subtle flattening with T2 hypointense thickening noted at the posterolateral aspects of the globes bilaterally, right slightly greater than left (series 10, image 8), of uncertain significance. Globes and orbital soft tissues otherwise unremarkable. Scattered mucosal thickening noted within the frontoethmoidal sinuses. Paranasal sinuses are otherwise clear. No significant mastoid effusion. Inner ear structures grossly normal.  Other: None.  MRA HEAD FINDINGS  ANTERIOR CIRCULATION:  Distal cervical segments of the internal carotid arteries are widely patent with antegrade flow. Mild atheromatous irregularity throughout the carotid siphons without hemodynamically significant stenosis or other abnormality. A1 segments, anterior communicating artery complex, and anterior cerebral arteries widely patent and within normal limits. No M1 stenosis or occlusion. Distal MCA branches well perfused and symmetric.  POSTERIOR CIRCULATION:  Vertebral arteries widely patent to the vertebrobasilar junction. Left vertebral artery strongly dominant, with a diffusely hypoplastic right vertebral artery. Both picas patent. Basilar widely patent to its distal aspect. Superior cerebral arteries patent bilaterally. Right PCA supplied via the basilar remains widely patent. Interval occlusion of the left PCA since previous exam. Some attenuated flow is seen within the left posterior communicating artery.  No intracranial aneurysm.  MRA NECK FINDINGS  AORTIC ARCH: Visualized aortic arch of normal caliber. Bovine branching pattern with common origin of the innominate and left common carotid arteries noted. No hemodynamically  significant stenosis seen about the origin of the great vessels.  RIGHT CAROTID SYSTEM: Right CCA widely patent from its origin to the bifurcation without stenosis. No significant atheromatous narrowing about the right bifurcation. Right ICA widely patent to the skull base without stenosis or other abnormality.  LEFT CAROTID SYSTEM: Left CCA widely patent from its origin to the bifurcation without stenosis. No more than mild short-segment 20% stenosis at the origin of the left ICA. Left ICA otherwise widely patent distally without stenosis or other vascular abnormality.  VERTEBRAL ARTERIES: Both vertebral arteries arise from subclavian arteries. No proximal subclavian artery stenosis. Left vertebral artery strongly dominant, with a diffusely hypoplastic right vertebral artery. Both vertebral arteries widely patent within the neck without stenosis or occlusion.  IMPRESSION: MRI HEAD IMPRESSION:  1. 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 No associated mass effect. 2. Underlying atrophy with moderate chronic microvascular ischemic disease. 3. Subtle flattening with T2 hypointense thickening at the posterolateral aspects of the globes bilaterally, of uncertain significance. Correlation with history and funduscopic exam suggested.  MRA HEAD IMPRESSION:  1. Interval occlusion of the left PCA since previous exam. 2. Otherwise stable intracranial MRA. No other large vessel occlusion. No other hemodynamically significant or correctable stenosis.  MRA NECK IMPRESSION:  1. No more than mild short-segment stenosis at the origin of the left ICA. Otherwise wide patency of both carotid artery systems within the neck. 2. Both vertebral arteries widely patent within the neck. Left vertebral artery strongly dominant, with a diffusely  hypoplastic right vertebral artery.      The brain MRI is reviewed in person in shows acute infarct on DWI involving mostly the medial temporal lobe on the left side. This is more confluent with patchy areas also noted involving the left thalamic region, spin of the corpus callosum on the left side and left occipital lobe. The area is consistent with left PCA infarct. There is significant /marked deep white matter increased signal seen on FLAIR imaging and  also periventricular confluent hyperintensity consistent with some chronic microvascular changes. Remote infarcts as seen on T1 involving the right thalamus and the right caudate  Nucleus. No hemorrhages noted.     [place] Arduini A. Merlene Laughter, M.D.  Diplomate, Tax adviser of Psychiatry and Neurology ( Neurology). 11/16/2019, 12:40 PM

## 2019-11-16 NOTE — Progress Notes (Addendum)
Inpatient Rehabilitation Admissions Coordinator  Inpatient rehab consult received. I contacted patient by phone and spoke with him and his girlfriend, Philip Stone. We discussed goals and expectations of a possible [facility] admit pending insurance approval and bed availability. They would like me to check benefits before determining preference for rehab venue. I will check and follow up with them today.    Danne Baxter, RN, MSN Rehab Admissions Coordinator [phone] 11/16/2019 12:35 PM   I spoke with patient and his girlfriend by phone reviewing benefits. They prefer [facility] and Philip Stone can assist after D/c . I will begin insurance authorization with [employer].  Danne Baxter, RN, MSN Rehab Admissions Coordinator [phone] 11/16/2019 1:53 PM

## 2019-11-17 ENCOUNTER — Encounter (HOSPITAL_COMMUNITY): Payer: Self-pay | Admitting: Physical Medicine & Rehabilitation

## 2019-11-17 ENCOUNTER — Inpatient Hospital Stay (HOSPITAL_COMMUNITY)
Admission: RE | Admit: 2019-11-17 | Discharge: 2019-12-08 | DRG: 057 | Disposition: A | Discharge status: Home Health | Payer: PPO | Source: Other Acute Inpatient Hospital | Attending: Physical Medicine & Rehabilitation | Admitting: Physical Medicine & Rehabilitation

## 2019-11-17 DIAGNOSIS — D696 Thrombocytopenia, unspecified: Secondary | ICD-10-CM | POA: Diagnosis present

## 2019-11-17 DIAGNOSIS — Z96651 Presence of right artificial knee joint: Secondary | ICD-10-CM | POA: Diagnosis present

## 2019-11-17 DIAGNOSIS — I69393 Ataxia following cerebral infarction: Secondary | ICD-10-CM

## 2019-11-17 DIAGNOSIS — R482 Apraxia: Secondary | ICD-10-CM

## 2019-11-17 DIAGNOSIS — Z886 Allergy status to analgesic agent status: Secondary | ICD-10-CM | POA: Diagnosis not present

## 2019-11-17 DIAGNOSIS — G8191 Hemiplegia, unspecified affecting right dominant side: Secondary | ICD-10-CM

## 2019-11-17 DIAGNOSIS — I69351 Hemiplegia and hemiparesis following cerebral infarction affecting right dominant side: Principal | ICD-10-CM

## 2019-11-17 DIAGNOSIS — R030 Elevated blood-pressure reading, without diagnosis of hypertension: Secondary | ICD-10-CM | POA: Diagnosis present

## 2019-11-17 DIAGNOSIS — Z87891 Personal history of nicotine dependence: Secondary | ICD-10-CM | POA: Diagnosis not present

## 2019-11-17 DIAGNOSIS — Z888 Allergy status to other drugs, medicaments and biological substances status: Secondary | ICD-10-CM | POA: Diagnosis not present

## 2019-11-17 DIAGNOSIS — H53461 Homonymous bilateral field defects, right side: Secondary | ICD-10-CM | POA: Diagnosis present

## 2019-11-17 DIAGNOSIS — R001 Bradycardia, unspecified: Secondary | ICD-10-CM | POA: Diagnosis not present

## 2019-11-17 DIAGNOSIS — K219 Gastro-esophageal reflux disease without esophagitis: Secondary | ICD-10-CM | POA: Diagnosis not present

## 2019-11-17 DIAGNOSIS — I69390 Apraxia following cerebral infarction: Secondary | ICD-10-CM

## 2019-11-17 DIAGNOSIS — R338 Other retention of urine: Secondary | ICD-10-CM | POA: Diagnosis not present

## 2019-11-17 DIAGNOSIS — N401 Enlarged prostate with lower urinary tract symptoms: Secondary | ICD-10-CM | POA: Diagnosis not present

## 2019-11-17 DIAGNOSIS — G600 Hereditary motor and sensory neuropathy: Secondary | ICD-10-CM | POA: Diagnosis not present

## 2019-11-17 DIAGNOSIS — K5901 Slow transit constipation: Secondary | ICD-10-CM | POA: Diagnosis present

## 2019-11-17 DIAGNOSIS — N400 Enlarged prostate without lower urinary tract symptoms: Secondary | ICD-10-CM | POA: Diagnosis present

## 2019-11-17 DIAGNOSIS — I69320 Aphasia following cerebral infarction: Secondary | ICD-10-CM

## 2019-11-17 DIAGNOSIS — M21371 Foot drop, right foot: Secondary | ICD-10-CM | POA: Diagnosis not present

## 2019-11-17 DIAGNOSIS — R488 Other symbolic dysfunctions: Secondary | ICD-10-CM | POA: Diagnosis present

## 2019-11-17 DIAGNOSIS — I639 Cerebral infarction, unspecified: Secondary | ICD-10-CM

## 2019-11-17 DIAGNOSIS — Z79899 Other long term (current) drug therapy: Secondary | ICD-10-CM | POA: Diagnosis not present

## 2019-11-17 DIAGNOSIS — I6381 Other cerebral infarction due to occlusion or stenosis of small artery: Secondary | ICD-10-CM | POA: Diagnosis present

## 2019-11-17 DIAGNOSIS — Z20822 Contact with and (suspected) exposure to covid-19: Secondary | ICD-10-CM | POA: Diagnosis not present

## 2019-11-17 DIAGNOSIS — E785 Hyperlipidemia, unspecified: Secondary | ICD-10-CM | POA: Diagnosis not present

## 2019-11-17 DIAGNOSIS — G63 Polyneuropathy in diseases classified elsewhere: Secondary | ICD-10-CM

## 2019-11-17 LAB — CBC
HCT: 45.5 % (ref 39.0–52.0)
Hemoglobin: 15.6 g/dL (ref 13.0–17.0)
MCH: 29.5 pg (ref 26.0–34.0)
MCHC: 34.3 g/dL (ref 30.0–36.0)
MCV: 86 fL (ref 80.0–100.0)
Platelets: 143 10*3/uL — ABNORMAL LOW (ref 150–400)
RBC: 5.29 MIL/uL (ref 4.22–5.81)
RDW: 13.4 % (ref 11.5–15.5)
WBC: 7.3 10*3/uL (ref 4.0–10.5)
nRBC: 0 % (ref 0.0–0.2)

## 2019-11-17 LAB — CREATININE, SERUM
Creatinine, Ser: 1.06 mg/dL (ref 0.61–1.24)
GFR, Estimated: >60 mL/min (ref >60)

## 2019-11-17 MED ORDER — ENOXAPARIN SODIUM 40 MG/0.4ML ~~LOC~~ SOLN
40.0000 mg | SUBCUTANEOUS | Status: DC
  Administered 2019-11-18 – 2019-12-08 (×21): 40 mg via SUBCUTANEOUS
  Filled 2019-11-17 (×22): qty 0.4

## 2019-11-17 MED ORDER — ENOXAPARIN SODIUM 40 MG/0.4ML ~~LOC~~ SOLN: 40.0000 mg | SUBCUTANEOUS | Status: DC

## 2019-11-17 MED ORDER — CLOPIDOGREL BISULFATE 75 MG PO TABS: 75.0000 mg | ORAL_TABLET | Freq: Every day | ORAL | Status: DC

## 2019-11-17 MED ORDER — AMLODIPINE BESYLATE 5 MG PO TABS: 5.0000 mg | ORAL_TABLET | Freq: Every day | ORAL | Status: DC

## 2019-11-17 MED ORDER — CLOPIDOGREL BISULFATE 75 MG PO TABS
75.0000 mg | ORAL_TABLET | Freq: Every day | ORAL | Status: DC
  Administered 2019-11-18 – 2019-12-08 (×21): 75 mg via ORAL
  Filled 2019-11-17 (×21): qty 1

## 2019-11-17 MED ORDER — TAMSULOSIN HCL 0.4 MG PO CAPS
0.8000 mg | ORAL_CAPSULE | Freq: Every day | ORAL | Status: DC
  Administered 2019-11-17 – 2019-12-07 (×21): 0.8 mg via ORAL
  Filled 2019-11-17 (×21): qty 2

## 2019-11-17 MED ORDER — ATORVASTATIN CALCIUM 40 MG PO TABS: 40.0000 mg | ORAL_TABLET | Freq: Every day | ORAL | Status: DC

## 2019-11-17 MED ORDER — PANTOPRAZOLE SODIUM 40 MG PO TBEC
40.0000 mg | DELAYED_RELEASE_TABLET | Freq: Every day | ORAL | Status: DC
  Administered 2019-11-18 – 2019-12-08 (×21): 40 mg via ORAL
  Filled 2019-11-17 (×21): qty 1

## 2019-11-17 MED ORDER — ATORVASTATIN CALCIUM 40 MG PO TABS
40.0000 mg | ORAL_TABLET | Freq: Every day | ORAL | Status: DC
  Administered 2019-11-18 – 2019-12-08 (×21): 40 mg via ORAL
  Filled 2019-11-17 (×21): qty 1

## 2019-11-17 NOTE — Progress Notes (Signed)
OT Cancellation Note  Patient Details Name: Philip Stone MRN: [id number] DOB: 1950/08/24   Cancelled Treatment:    Reason Eval/Treat Not Completed: Other (comment). OT orders received. Patient's chart reviewed. Patient is currently accepted to [facility] due to acute CVA effecting his right side and in process of discharge. Unable to complete a formal evaluation before discharge. Based on PT evaluation, patient is presenting with right side upper and lower extremity weakness causing him to require Mod assist for functional transfers. More than likely will require mod-max assist for lower and upper body ADL tasks. Agree with recommendation for [facility] and will defer a formal OT evaluation to staff OT at [facility].   Ailene Ravel, OTR/L,CBIS  [phone]  11/17/2019, 10:55 AM

## 2019-11-17 NOTE — H&P (Signed)
Physical Medicine and Rehabilitation Admission H&P    Chief Complaint  Patient presents with  . Numbness  : HPI: Philip Stone is a 69 year old right-handed male with history significant for Charcot-Marie-Tooth disease, right foot drop with neuropathy, cervical stenosis with decompression and discectomy 10 years ago, right TKA 01/21/2018, quit smoking 13 years ago.  History taken from chart review due to aphasia.  Patient lives alone 1 level home 2 steps to entry independent with assistive device and still driving.  He does have a girlfriend with good support.  He presented on 11/15/2019 to [facility] with right side hemiparesis and numbness x3 days as well as aphasia.  Initial MRI/MRA showed no acute infarction hemorrhage or mass.  Moderate chronic microvascular ischemic changes.  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  No associated mass-effect.  MRA of the head interval occlusion of left PCA since previous exam.  No other large vessel occlusion.  MRA of the neck no more than mild short segment stenosis of the origin of the left ICA.  Both vertebral arteries widely patent within the neck.  Patient did not receive TPA.  Echocardiogram with ejection fraction of 60 to 65%, no wall motion abnormalities.  Grade 1 diastolic dysfunction. Admission chemistries were unremarkable, hemoglobin 15.2, platelets 126.  Neurology follow-up Dr.[place] and maintained on Plavix for CVA prophylaxis.  Subcutaneous Lovenox for DVT prophylaxis.  Tolerating a regular diet.  Noted on 11/17/2019 patient found sitting on the floor in front of his bed with soiled linens no apparent injury except for abrasion left knee.  Therapy evaluations completed and patient was admitted for a comprehensive rehab program.   Please see preadmission assessment from earlier today as well.  Review of Systems  Unable to perform ROS: Mental acuity   Past Medical History:  Diagnosis Date  . Arthritis   . BPH (benign prostatic hyperplasia)   . Charcot-Marie-Tooth disease   . Foot drop, right   . Neuropathy   . Platelets decreased ([place])   . Spinal stenosis    Past Surgical History:  Procedure Laterality Date  . CERVICAL DISC SURGERY  10 YEARS AGO   reports there are plates and screws in place   . FOOT ARTHROPLASTY    . INGUINAL HERNIA REPAIR Right 06/15/2014   Procedure: RIGHT INGUINAL HERNIORRHAPHY  WITH MESH;  Surgeon: Aviva Signs Md, MD;  Location: [facility];  Service: General;  Laterality: Right;  . INSERTION OF MESH Right 06/15/2014   Procedure: INSERTION OF MESH RIGHT INGUINAL HERNIORRHAPHY;  Surgeon: Aviva Signs Md, MD;  Location: [facility];  Service: General;  Laterality: Right;  . KNEE ARTHROSCOPY Right   . TOTAL KNEE ARTHROPLASTY Right 01/21/2018   Procedure: TOTAL KNEE ARTHROPLASTY;  Surgeon: Sydnee Cabal, MD;  Location: [facility];  Service: Orthopedics;  Laterality: Right;   Family History  Problem Relation Age of Onset  . Suicidality Mother   . Other Father        "old age"   Social History:  reports that he quit smoking about 13 years ago. His smoking use included cigarettes. He started smoking about 30 years ago. He has a 18.00 pack-year smoking history. He has never used smokeless tobacco. He reports that he does not drink alcohol and does not use drugs. Allergies:  Allergies  Allergen  Reactions  . Asa [Aspirin] Anaphylaxis  . Prednisone     Makes him lightheaded   Medications Prior to Admission  Medication Sig Dispense Refill  . pantoprazole (PROTONIX) 40 MG tablet Take 1 tablet (40 mg total) by mouth daily. 90 tablet 1  . tamsulosin (FLOMAX) 0.4 MG CAPS capsule TAKE TWO CAPSULES BY MOUTH DAILY AT BEDTIME (Patient taking differently: Take 0.8 mg by mouth at bedtime. ) 180 capsule 0     Drug Regimen Review Drug regimen was reviewed and remains appropriate with no significant issues identified  Home: Home Living Family/patient expects to be discharged to:: Private residence Living Arrangements: Alone Available Help at Discharge:  (girlfriend can assist) Type of Home: House Home Access: Stairs to enter [employer] of Steps: 2 Entrance Stairs-Rails: None Home Layout: One level Bathroom Shower/Tub: Multimedia programmer: Standard Bathroom Accessibility: Yes Home Equipment: Environmental consultant - 2 wheels, Cane - single point, Bedside commode, Shower seat  Lives With: Alone   Functional History: Prior Function Level of Independence: Independent with assistive device(s) Comments: Hydrographic surveyor, drives, wears bilateral ankle braces  Functional Status:  Mobility: Bed Mobility Overal bed mobility: Needs Assistance Bed Mobility: Supine to Sit, Sit to Supine Supine to sit: Min guard Sit to supine: Min guard General bed mobility comments: increased time, labored movement Transfers Overall transfer level: Needs assistance Equipment used: Rolling walker (2 wheeled), None Transfers: Sit to/from Stand, [employer] Transfers Sit to Stand: Min assist Stand pivot transfers: Min assist, Mod assist General transfer comment: neglict of right side, very unsteady with poor right handed grip strength to hold onto RW Ambulation/Gait Ambulation/Gait assistance: Mod assist Gait Distance (Feet): 30 Feet Assistive device: Rolling walker (2 wheeled) Gait Pattern/deviations: Step-to pattern, Decreased step length - right, Decreased step length - left, Decreased stance time - right, Decreased dorsiflexion - right, Decreased dorsiflexion - left, Ataxic General Gait Details: slow labored cadence with diffiuclty advancing RLE due to weakness, decreased proprioception, ataxic like movement, limited secondary to c/o fatigue Gait velocity: decreased    ADL:     Cognition: Cognition Overall Cognitive Status: Impaired/Different from baseline Arousal/Alertness: Awake/alert Orientation Level: Oriented X4 Memory: Impaired Memory Impairment: Decreased short term memory Awareness: Appears intact Problem Solving: Impaired Problem Solving Impairment: Verbal complex Executive Function: Self Monitoring, Self Correcting Self Monitoring: Impaired Self Monitoring Impairment: Verbal basic Self Correcting: Impaired Self Correcting Impairment: Verbal basic Safety/Judgment: Appears intact Cognition Arousal/Alertness: Awake/alert Behavior During Therapy: WFL for tasks assessed/performed Overall Cognitive Status: Impaired/Different from baseline  Physical Exam: Blood pressure (!) 167/93, pulse 85, temperature 98 F (36.7 C), temperature source Oral, resp. rate 20, height 5\' 10"  (1.778 m), weight 82 kg, SpO2 96 %. Physical Exam Vitals reviewed.  Constitutional:      General: He is not in acute distress.    Appearance: Normal appearance.  HENT:     Head: Normocephalic and atraumatic.     Right Ear: External ear normal.     Left Ear: External ear normal.     Nose: Nose normal.  Eyes:     General:        Right eye: No discharge.        Left eye: No discharge.     Extraocular Movements: Extraocular movements intact.  Cardiovascular:     Rate and Rhythm: Normal rate and regular rhythm.  Pulmonary:     Effort: Pulmonary effort is normal. No respiratory distress.     Breath sounds: Normal breath sounds. No stridor.  Abdominal:  General: Abdomen is flat. Bowel sounds are normal. There is no distension.  Musculoskeletal:     Cervical back: Normal range of motion and neck supple.     Comments: No edema or tenderness in extremities  Neurological:     Mental Status: He is alert.     Comments: Alert and oriented?  X1 Moderate expressive aphasia LUE: 5/5 proximal distal LLE: Hip flexion, knee extension 4-4+/5, ankle dorsiflexion 1/5 RUE: Appears  to be 4/5 proximal to distal, limited due to apraxia RLE: Hip flexion, knee extension 2+/5, ankle dorsiflexion 0/5  Psychiatric:        Mood and Affect: Affect is blunt.        Speech: Speech is delayed.        Behavior: Behavior is slowed.     Results for orders placed or performed during the hospital encounter of 11/15/19 (from the past 48 hour(s))  CBC     Status: Abnormal   Collection Time: 11/15/19  5:41 PM  Result Value Ref Range   WBC 6.3 4.0 - 10.5 K/uL   RBC 5.12 4.22 - 5.81 MIL/uL   Hemoglobin 15.2 13.0 - 17.0 g/dL   HCT 44.0 39 - 52 %   MCV 85.9 80.0 - 100.0 fL   MCH 29.7 26.0 - 34.0 pg   MCHC 34.5 30.0 - 36.0 g/dL   RDW 13.2 11.5 - 15.5 %   Platelets 126 (L) 150 - 400 K/uL   nRBC 0.0 0.0 - 0.2 %    Comment: Performed at [facility], [address], [place], [place] [address]  Basic metabolic panel     Status: Abnormal   Collection Time: 11/15/19  5:41 PM  Result Value Ref Range   Sodium 137 135 - 145 mmol/L   Potassium 3.4 (L) 3.5 - 5.1 mmol/L   Chloride 103 98 - 111 mmol/L   CO2 23 22 - 32 mmol/L   Glucose, Bld 102 (H) 70 - 99 mg/dL    Comment: Glucose reference range applies only to samples taken after fasting for at least 8 hours.   BUN 13 8 - 23 mg/dL   Creatinine, Ser 0.93 0.61 - 1.24 mg/dL   Calcium 9.2 8.9 - 10.3 mg/dL   GFR calc non Af Amer >60 >60 mL/min   Anion gap 11 5 - 15    Comment: Performed at [facility], [address], [place], [place] [address]  Respiratory Panel by RT PCR (Flu A&B, Covid) - Nasopharyngeal Swab     Status: None   Collection Time: 11/15/19  9:55 PM   Specimen: Nasopharyngeal Swab  Result Value Ref Range   SARS Coronavirus 2 by RT PCR NEGATIVE NEGATIVE    Comment: (NOTE) SARS-CoV-2 target nucleic acids are NOT DETECTED.  The SARS-CoV-2 RNA is generally detectable in upper respiratoy specimens during the acute phase of infection. The lowest concentration of SARS-CoV-2 viral copies this assay can detect is 131 copies/mL. A  negative result does not preclude SARS-Cov-2 infection and should not be used as the sole basis for treatment or other patient management decisions. A negative result may occur with  improper specimen collection/handling, submission of specimen other than nasopharyngeal swab, presence of viral mutation(s) within the areas targeted by this assay, and inadequate number of viral copies (<131 copies/mL). A negative result must be combined with clinical observations, patient history, and epidemiological information. The expected result is Negative.  Fact Sheet for Patients:  PinkCheek.be  Fact Sheet for Healthcare Providers:  GravelBags.it  This test is no t yet approved or cleared by the [place] and  has been authorized for detection and/or diagnosis of SARS-CoV-2 by FDA under an Emergency Use Authorization (EUA). This EUA will remain  in effect (meaning this test can be used) for the duration of the COVID-19 declaration under Section 564(b)(1) of the Act, 21 U.S.C. section 360bbb-3(b)(1), unless the authorization is terminated or revoked sooner.     Influenza A by PCR NEGATIVE NEGATIVE   Influenza B by PCR NEGATIVE NEGATIVE    Comment: (NOTE) The Xpert Xpress SARS-CoV-2/FLU/RSV assay is intended as an aid in  the diagnosis of influenza from Nasopharyngeal swab specimens and  should not be used as a sole basis for treatment. Nasal washings and  aspirates are unacceptable for Xpert Xpress SARS-CoV-2/FLU/RSV  testing.  Fact Sheet for Patients: PinkCheek.be  Fact Sheet for Healthcare Providers: GravelBags.it  This test is not yet approved or cleared by the [place] FDA and  has been authorized for detection and/or diagnosis of SARS-CoV-2 by  FDA under an Emergency Use Authorization (EUA). This EUA will remain  in effect (meaning this test can be used)  for the duration of the  Covid-19 declaration under Section 564(b)(1) of the Act, 21  U.S.C. section 360bbb-3(b)(1), unless the authorization is  terminated or revoked. Performed at [facility], [address], [place], [place] [address]   Comprehensive metabolic panel     Status: Abnormal   Collection Time: 11/16/19  4:49 AM  Result Value Ref Range   Sodium 139 135 - 145 mmol/L   Potassium 4.2 3.5 - 5.1 mmol/L    Comment: DELTA CHECK NOTED   Chloride 106 98 - 111 mmol/L   CO2 24 22 - 32 mmol/L   Glucose, Bld 102 (H) 70 - 99 mg/dL    Comment: Glucose reference range applies only to samples taken after fasting for at least 8 hours.   BUN 14 8 - 23 mg/dL   Creatinine, Ser 0.92 0.61 - 1.24 mg/dL   Calcium 9.2 8.9 - 10.3 mg/dL   Total Protein 7.2 6.5 - 8.1 g/dL   Albumin 4.1 3.5 - 5.0 g/dL   AST 24 15 - 41 U/L   ALT 22 0 - 44 U/L   Alkaline Phosphatase 76 38 - 126 U/L   Total Bilirubin 1.2 0.3 - 1.2 mg/dL   GFR calc non Af Amer >60 >60 mL/min   Anion gap 9 5 - 15    Comment: Performed at [facility], [address], [place], [place] [address]  CBC     Status: Abnormal   Collection Time: 11/16/19  4:49 AM  Result Value Ref Range   WBC 7.5 4.0 - 10.5 K/uL   RBC 5.16 4.22 - 5.81 MIL/uL   Hemoglobin 15.4 13.0 - 17.0 g/dL   HCT 44.1 39 - 52 %   MCV 85.5 80.0 - 100.0 fL   MCH 29.8 26.0 - 34.0 pg   MCHC 34.9 30.0 - 36.0 g/dL   RDW 13.2 11.5 - 15.5 %   Platelets 139 (L) 150 - 400 K/uL   nRBC 0.0 0.0 - 0.2 %    Comment: Performed at [facility], [address] [place], [place] [address]  Protime-INR     Status: None   Collection Time: 11/16/19  4:49 AM  Result Value Ref Range   Prothrombin Time 14.7 11.4 - 15.2 seconds   INR 1.2 0.8 - 1.2    Comment: (NOTE) INR goal  varies based on device and disease states. Performed at [facility], [address], [place], [place] [address]   APTT     Status: None   Collection Time: 11/16/19  4:49 AM  Result Value Ref Range   aPTT 32 24 -  36 seconds    Comment: Performed at [facility], [address], [place], [place] [address]  Magnesium     Status: None   Collection Time: 11/16/19  4:49 AM  Result Value Ref Range   Magnesium 2.0 1.7 - 2.4 mg/dL    Comment: Performed at [facility], [address], [place], [place] [address]  Phosphorus     Status: None   Collection Time: 11/16/19  4:49 AM  Result Value Ref Range   Phosphorus 3.4 2.5 - 4.6 mg/dL    Comment: Performed at [facility], [address], [place], [place] [address]  Lipid panel     Status: Abnormal   Collection Time: 11/16/19  4:49 AM  Result Value Ref Range   Cholesterol 186 0 - 200 mg/dL   Triglycerides 44 <150 mg/dL   HDL 52 >40 mg/dL   Total CHOL/HDL Ratio 3.6 RATIO   VLDL 9 0 - 40 mg/dL   LDL Cholesterol 125 (H) 0 - 99 mg/dL    Comment:        Total Cholesterol/HDL:CHD Risk Coronary Heart Disease Risk Table                     Men   Women  1/2 Average Risk   3.4   3.3  Average Risk       5.0   4.4  2 X Average Risk   9.6   7.1  3 X Average Risk  23.4   11.0        Use the calculated Patient Ratio above and the CHD Risk Table to determine the patient's CHD Risk.        ATP III CLASSIFICATION (LDL):  <100     mg/dL   Optimal  100-129  mg/dL   Near or Above                    Optimal  130-159  mg/dL   Borderline  160-189  mg/dL   High  >190     mg/dL   Very High Performed at [facility] [place], [place] [address]   Hemoglobin A1c     Status: None   Collection Time: 11/16/19  4:49 AM  Result Value Ref Range   Hgb A1c MFr Bld 5.6 4.8 - 5.6 %    Comment: (NOTE) Pre diabetes:          5.7%-6.4%  Diabetes:              >6.4%  Glycemic control for   <7.0% adults with diabetes    Mean Plasma Glucose 114.02 mg/dL    Comment: Performed at [facility] [address] [place], [place] [address]  TSH     Status: None   Collection Time: 11/16/19  4:49 AM  Result Value Ref Range   TSH 1.635 0.350 - 4.500 uIU/mL     Comment: Performed by a 3rd Generation assay with a functional sensitivity of <=0.01 uIU/mL. Performed at [facility], [address] [place], [place] [address]   Vitamin B12     Status: None   Collection Time: 11/16/19  4:49 AM  Result Value Ref Range  Vitamin B-12 305 180 - 914 pg/mL    Comment: (NOTE) This assay is not validated for testing neonatal or myeloproliferative syndrome specimens for Vitamin B12 levels. Performed at [facility], [address] [place], [place] [address]   Folate     Status: None   Collection Time: 11/16/19  4:49 AM  Result Value Ref Range   Folate 32.4 >5.9 ng/mL    Comment: RESULTS CONFIRMED BY MANUAL DILUTION Performed at [facility], [address], [place], [place] [address]    MR ANGIO HEAD WO CONTRAST  Result Date: 11/15/2019 CLINICAL DATA:  Initial evaluation for acute neuro deficit, stroke suspected. EXAM: MRI HEAD WITHOUT CONTRAST MRA HEAD WITHOUT CONTRAST MRA NECK WITHOUT AND WITH CONTRAST TECHNIQUE: Multiplanar, multiecho pulse sequences of the brain and surrounding structures were obtained without intravenous contrast. Angiographic images of the Circle of Willis were obtained using MRA technique without intravenous contrast. Angiographic images of the neck were obtained using MRA technique without and with intravenous contrast. Carotid stenosis measurements (when applicable) are obtained utilizing NASCET criteria, using the distal internal carotid diameter as the denominator. CONTRAST:  56mL GADAVIST GADOBUTROL 1 MMOL/ML IV SOLN COMPARISON:  Recent MRI/MRA from 11/14/2019. FINDINGS: MRI HEAD FINDINGS Brain: There has been interval development of patchy restricted diffusion involving the parasagittal left temporal occipital region, compatible with an acute left PCA territory infarct, overall moderate in size. Involvement is greatest at the mesial left temporal lobe/left hippocampal formation. Mild patchy involvement of the left thalamus noted as well.  Additional punctate cortical infarct noted superiorly at the paramedian left parietal lobe (series 5, image thirty-five). No associated hemorrhage or mass effect. No other evidence for acute or interval infarction. Gray-white matter differentiation otherwise maintained. No susceptibility artifact to suggest acute or chronic intracranial hemorrhage. Underlying atrophy with moderate chronic microvascular ischemic disease again noted. No mass lesion, mass effect, or midline shift. No hydrocephalus or extra-axial fluid collection. Pituitary gland and suprasellar region normal. Midline structures intact. Vascular: Major intracranial vascular flow voids are maintained. Skull and upper cervical spine: Craniocervical junction within normal limits. Postsurgical changes partially visualize within the upper cervical spine. Bone marrow signal intensity normal. No scalp soft tissue abnormality. Sinuses/Orbits: Subtle flattening with T2 hypointense thickening noted at the posterolateral aspects of the globes bilaterally, right slightly greater than left (series 10, image 8), of uncertain significance. Globes and orbital soft tissues otherwise unremarkable. Scattered mucosal thickening noted within the frontoethmoidal sinuses. Paranasal sinuses are otherwise clear. No significant mastoid effusion. Inner ear structures grossly normal. Other: None. MRA HEAD FINDINGS ANTERIOR CIRCULATION: Distal cervical segments of the internal carotid arteries are widely patent with antegrade flow. Mild atheromatous irregularity throughout the carotid siphons without hemodynamically significant stenosis or other abnormality. A1 segments, anterior communicating artery complex, and anterior cerebral arteries widely patent and within normal limits. No M1 stenosis or occlusion. Distal MCA branches well perfused and symmetric. POSTERIOR CIRCULATION: Vertebral arteries widely patent to the vertebrobasilar junction. Left vertebral artery strongly  dominant, with a diffusely hypoplastic right vertebral artery. Both picas patent. Basilar widely patent to its distal aspect. Superior cerebral arteries patent bilaterally. Right PCA supplied via the basilar remains widely patent. Interval occlusion of the left PCA since previous exam. Some attenuated flow is seen within the left posterior communicating artery. No intracranial aneurysm. MRA NECK FINDINGS AORTIC ARCH: Visualized aortic arch of normal caliber. Bovine branching pattern with common origin of the innominate and left common carotid arteries noted. No hemodynamically significant stenosis seen about the origin of  the great vessels. RIGHT CAROTID SYSTEM: Right CCA widely patent from its origin to the bifurcation without stenosis. No significant atheromatous narrowing about the right bifurcation. Right ICA widely patent to the skull base without stenosis or other abnormality. LEFT CAROTID SYSTEM: Left CCA widely patent from its origin to the bifurcation without stenosis. No more than mild short-segment 20% stenosis at the origin of the left ICA. Left ICA otherwise widely patent distally without stenosis or other vascular abnormality. VERTEBRAL ARTERIES: Both vertebral arteries arise from subclavian arteries. No proximal subclavian artery stenosis. Left vertebral artery strongly dominant, with a diffusely hypoplastic right vertebral artery. Both vertebral arteries widely patent within the neck without stenosis or occlusion. IMPRESSION: MRI HEAD IMPRESSION: 1. 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 No associated mass effect. 2. Underlying atrophy with moderate chronic microvascular ischemic disease. 3. Subtle flattening with T2 hypointense thickening at the posterolateral aspects of the globes bilaterally, of uncertain significance. Correlation with history and  funduscopic exam suggested. MRA HEAD IMPRESSION: 1. Interval occlusion of the left PCA since previous exam. 2. Otherwise stable intracranial MRA. No other large vessel occlusion. No other hemodynamically significant or correctable stenosis. MRA NECK IMPRESSION: 1. No more than mild short-segment stenosis at the origin of the left ICA. Otherwise wide patency of both carotid artery systems within the neck. 2. Both vertebral arteries widely patent within the neck. Left vertebral artery strongly dominant, with a diffusely hypoplastic right vertebral artery. Electronically Signed   By: Jeannine Boga M.D.   On: 11/15/2019 20:13   MR ANGIO NECK W WO CONTRAST  Result Date: 11/15/2019 CLINICAL DATA:  Initial evaluation for acute neuro deficit, stroke suspected. EXAM: MRI HEAD WITHOUT CONTRAST MRA HEAD WITHOUT CONTRAST MRA NECK WITHOUT AND WITH CONTRAST TECHNIQUE: Multiplanar, multiecho pulse sequences of the brain and surrounding structures were obtained without intravenous contrast. Angiographic images of the Circle of Willis were obtained using MRA technique without intravenous contrast. Angiographic images of the neck were obtained using MRA technique without and with intravenous contrast. Carotid stenosis measurements (when applicable) are obtained utilizing NASCET criteria, using the distal internal carotid diameter as the denominator. CONTRAST:  50mL GADAVIST GADOBUTROL 1 MMOL/ML IV SOLN COMPARISON:  Recent MRI/MRA from 11/14/2019. FINDINGS: MRI HEAD FINDINGS Brain: There has been interval development of patchy restricted diffusion involving the parasagittal left temporal occipital region, compatible with an acute left PCA territory infarct, overall moderate in size. Involvement is greatest at the mesial left temporal lobe/left hippocampal formation. Mild patchy involvement of the left thalamus noted as well. Additional punctate cortical infarct noted superiorly at the paramedian left parietal lobe (series 5,  image thirty-five). No associated hemorrhage or mass effect. No other evidence for acute or interval infarction. Gray-white matter differentiation otherwise maintained. No susceptibility artifact to suggest acute or chronic intracranial hemorrhage. Underlying atrophy with moderate chronic microvascular ischemic disease again noted. No mass lesion, mass effect, or midline shift. No hydrocephalus or extra-axial fluid collection. Pituitary gland and suprasellar region normal. Midline structures intact. Vascular: Major intracranial vascular flow voids are maintained. Skull and upper cervical spine: Craniocervical junction within normal limits. Postsurgical changes partially visualize within the upper cervical spine. Bone marrow signal intensity normal. No scalp soft tissue abnormality. Sinuses/Orbits: Subtle flattening with T2 hypointense thickening noted at the posterolateral aspects of the globes bilaterally, right slightly greater than left (series 10, image 8), of uncertain significance. Globes and orbital soft tissues  otherwise unremarkable. Scattered mucosal thickening noted within the frontoethmoidal sinuses. Paranasal sinuses are otherwise clear. No significant mastoid effusion. Inner ear structures grossly normal. Other: None. MRA HEAD FINDINGS ANTERIOR CIRCULATION: Distal cervical segments of the internal carotid arteries are widely patent with antegrade flow. Mild atheromatous irregularity throughout the carotid siphons without hemodynamically significant stenosis or other abnormality. A1 segments, anterior communicating artery complex, and anterior cerebral arteries widely patent and within normal limits. No M1 stenosis or occlusion. Distal MCA branches well perfused and symmetric. POSTERIOR CIRCULATION: Vertebral arteries widely patent to the vertebrobasilar junction. Left vertebral artery strongly dominant, with a diffusely hypoplastic right vertebral artery. Both picas patent. Basilar widely patent to its  distal aspect. Superior cerebral arteries patent bilaterally. Right PCA supplied via the basilar remains widely patent. Interval occlusion of the left PCA since previous exam. Some attenuated flow is seen within the left posterior communicating artery. No intracranial aneurysm. MRA NECK FINDINGS AORTIC ARCH: Visualized aortic arch of normal caliber. Bovine branching pattern with common origin of the innominate and left common carotid arteries noted. No hemodynamically significant stenosis seen about the origin of the great vessels. RIGHT CAROTID SYSTEM: Right CCA widely patent from its origin to the bifurcation without stenosis. No significant atheromatous narrowing about the right bifurcation. Right ICA widely patent to the skull base without stenosis or other abnormality. LEFT CAROTID SYSTEM: Left CCA widely patent from its origin to the bifurcation without stenosis. No more than mild short-segment 20% stenosis at the origin of the left ICA. Left ICA otherwise widely patent distally without stenosis or other vascular abnormality. VERTEBRAL ARTERIES: Both vertebral arteries arise from subclavian arteries. No proximal subclavian artery stenosis. Left vertebral artery strongly dominant, with a diffusely hypoplastic right vertebral artery. Both vertebral arteries widely patent within the neck without stenosis or occlusion. IMPRESSION: MRI HEAD IMPRESSION: 1. 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 No associated mass effect. 2. Underlying atrophy with moderate chronic microvascular ischemic disease. 3. Subtle flattening with T2 hypointense thickening at the posterolateral aspects of the globes bilaterally, of uncertain significance. Correlation with history and funduscopic exam suggested. MRA HEAD IMPRESSION: 1. Interval occlusion of the left PCA since previous exam. 2.  Otherwise stable intracranial MRA. No other large vessel occlusion. No other hemodynamically significant or correctable stenosis. MRA NECK IMPRESSION: 1. No more than mild short-segment stenosis at the origin of the left ICA. Otherwise wide patency of both carotid artery systems within the neck. 2. Both vertebral arteries widely patent within the neck. Left vertebral artery strongly dominant, with a diffusely hypoplastic right vertebral artery. Electronically Signed   By: Jeannine Boga M.D.   On: 11/15/2019 20:13   MR BRAIN WO CONTRAST  Result Date: 11/15/2019 CLINICAL DATA:  Initial evaluation for acute neuro deficit, stroke suspected. EXAM: MRI HEAD WITHOUT CONTRAST MRA HEAD WITHOUT CONTRAST MRA NECK WITHOUT AND WITH CONTRAST TECHNIQUE: Multiplanar, multiecho pulse sequences of the brain and surrounding structures were obtained without intravenous contrast. Angiographic images of the Circle of Willis were obtained using MRA technique without intravenous contrast. Angiographic images of the neck were obtained using MRA technique without and with intravenous contrast. Carotid stenosis measurements (when applicable) are obtained utilizing NASCET criteria, using the distal internal carotid diameter as the denominator. CONTRAST:  79mL GADAVIST GADOBUTROL 1 MMOL/ML IV SOLN COMPARISON:  Recent MRI/MRA from 11/14/2019. FINDINGS: MRI HEAD FINDINGS Brain: There has been interval development of patchy restricted  diffusion involving the parasagittal left temporal occipital region, compatible with an acute left PCA territory infarct, overall moderate in size. Involvement is greatest at the mesial left temporal lobe/left hippocampal formation. Mild patchy involvement of the left thalamus noted as well. Additional punctate cortical infarct noted superiorly at the paramedian left parietal lobe (series 5, image thirty-five). No associated hemorrhage or mass effect. No other evidence for acute or interval infarction.  Gray-white matter differentiation otherwise maintained. No susceptibility artifact to suggest acute or chronic intracranial hemorrhage. Underlying atrophy with moderate chronic microvascular ischemic disease again noted. No mass lesion, mass effect, or midline shift. No hydrocephalus or extra-axial fluid collection. Pituitary gland and suprasellar region normal. Midline structures intact. Vascular: Major intracranial vascular flow voids are maintained. Skull and upper cervical spine: Craniocervical junction within normal limits. Postsurgical changes partially visualize within the upper cervical spine. Bone marrow signal intensity normal. No scalp soft tissue abnormality. Sinuses/Orbits: Subtle flattening with T2 hypointense thickening noted at the posterolateral aspects of the globes bilaterally, right slightly greater than left (series 10, image 8), of uncertain significance. Globes and orbital soft tissues otherwise unremarkable. Scattered mucosal thickening noted within the frontoethmoidal sinuses. Paranasal sinuses are otherwise clear. No significant mastoid effusion. Inner ear structures grossly normal. Other: None. MRA HEAD FINDINGS ANTERIOR CIRCULATION: Distal cervical segments of the internal carotid arteries are widely patent with antegrade flow. Mild atheromatous irregularity throughout the carotid siphons without hemodynamically significant stenosis or other abnormality. A1 segments, anterior communicating artery complex, and anterior cerebral arteries widely patent and within normal limits. No M1 stenosis or occlusion. Distal MCA branches well perfused and symmetric. POSTERIOR CIRCULATION: Vertebral arteries widely patent to the vertebrobasilar junction. Left vertebral artery strongly dominant, with a diffusely hypoplastic right vertebral artery. Both picas patent. Basilar widely patent to its distal aspect. Superior cerebral arteries patent bilaterally. Right PCA supplied via the basilar remains widely  patent. Interval occlusion of the left PCA since previous exam. Some attenuated flow is seen within the left posterior communicating artery. No intracranial aneurysm. MRA NECK FINDINGS AORTIC ARCH: Visualized aortic arch of normal caliber. Bovine branching pattern with common origin of the innominate and left common carotid arteries noted. No hemodynamically significant stenosis seen about the origin of the great vessels. RIGHT CAROTID SYSTEM: Right CCA widely patent from its origin to the bifurcation without stenosis. No significant atheromatous narrowing about the right bifurcation. Right ICA widely patent to the skull base without stenosis or other abnormality. LEFT CAROTID SYSTEM: Left CCA widely patent from its origin to the bifurcation without stenosis. No more than mild short-segment 20% stenosis at the origin of the left ICA. Left ICA otherwise widely patent distally without stenosis or other vascular abnormality. VERTEBRAL ARTERIES: Both vertebral arteries arise from subclavian arteries. No proximal subclavian artery stenosis. Left vertebral artery strongly dominant, with a diffusely hypoplastic right vertebral artery. Both vertebral arteries widely patent within the neck without stenosis or occlusion. IMPRESSION: MRI HEAD IMPRESSION: 1. 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 No associated mass effect. 2. Underlying atrophy with moderate chronic microvascular ischemic disease. 3. Subtle flattening with T2 hypointense thickening at the posterolateral aspects of the globes bilaterally, of uncertain significance. Correlation with history and funduscopic exam suggested. MRA HEAD IMPRESSION: 1. Interval occlusion of the left PCA since previous exam. 2. Otherwise stable intracranial MRA. No other large vessel occlusion. No other hemodynamically significant or correctable  stenosis. MRA NECK  IMPRESSION: 1. No more than mild short-segment stenosis at the origin of the left ICA. Otherwise wide patency of both carotid artery systems within the neck. 2. Both vertebral arteries widely patent within the neck. Left vertebral artery strongly dominant, with a diffusely hypoplastic right vertebral artery. Electronically Signed   By: Jeannine Boga M.D.   On: 11/15/2019 20:13   ECHOCARDIOGRAM COMPLETE  Result Date: 11/16/2019    ECHOCARDIOGRAM REPORT   Patient Name:   Philip Stone Date of Exam: 11/16/2019 Medical Rec #:  [id number]    Height:       70.0 in Accession #:    [id number]   Weight:       180.0 lb Date of Birth:  Feb 21, 1950    BSA:          1.996 m Patient Age:    67 years     BP:           155/82 mmHg Patient Gender: M            HR:           67 bpm. Exam Location:  [facility] Procedure: 2D Echo Indications:    Stroke 434.91 / I163.9  History:        Patient has no prior history of Echocardiogram examinations.                 Stroke; Risk Factors:Former Smoker. CMT (Charcot-Marie-Tooth                 disease, GERD.  Sonographer:    Leavy Cella RDCS (AE) Referring Phys: [id number] OLADAPO ADEFESO IMPRESSIONS  1. Left ventricular ejection fraction, by estimation, is 60 to 65%. The left ventricle has normal function. The left ventricle has no regional wall motion abnormalities. There is mild left ventricular hypertrophy. Left ventricular diastolic parameters are consistent with Grade I diastolic dysfunction (impaired relaxation).  2. Right ventricular systolic function is normal. The right ventricular size is normal.  3. The mitral valve is normal in structure. No evidence of mitral valve regurgitation. No evidence of mitral stenosis.  4. The aortic valve is tricuspid. Aortic valve regurgitation is not visualized. No aortic stenosis is present.  5. The inferior vena cava is normal in size with greater than 50% respiratory variability, suggesting right atrial pressure of 3  mmHg. FINDINGS  Left Ventricle: Left ventricular ejection fraction, by estimation, is 60 to 65%. The left ventricle has normal function. The left ventricle has no regional wall motion abnormalities. The left ventricular internal cavity size was normal in size. There is  mild left ventricular hypertrophy. Left ventricular diastolic parameters are consistent with Grade I diastolic dysfunction (impaired relaxation). Normal left ventricular filling pressure. Right Ventricle: The right ventricular size is normal. No increase in right ventricular wall thickness. Right ventricular systolic function is normal. Left Atrium: Left atrial size was normal in size. Right Atrium: Right atrial size was normal in size. Pericardium: There is no evidence of pericardial effusion. Mitral Valve: The mitral valve is normal in structure. No evidence of mitral valve regurgitation. No evidence of mitral valve stenosis. Tricuspid Valve: The tricuspid valve is normal in structure. Tricuspid valve regurgitation is trivial. No evidence of tricuspid stenosis. Aortic Valve: The aortic valve is tricuspid. Aortic valve regurgitation is not visualized. No aortic stenosis is present. Aortic valve mean gradient measures 3.2 mmHg. Aortic valve peak gradient measures 5.4 mmHg. Aortic valve area, by VTI measures 3.93 cm. Pulmonic Valve: The pulmonic valve was not well  visualized. Pulmonic valve regurgitation is not visualized. No evidence of pulmonic stenosis. Aorta: The aortic root is normal in size and structure. Venous: The inferior vena cava is normal in size with greater than 50% respiratory variability, suggesting right atrial pressure of 3 mmHg. IAS/Shunts: The interatrial septum was not well visualized.  LEFT VENTRICLE PLAX 2D LVIDd:         4.39 cm  Diastology LVIDs:         2.15 cm  LV e' medial:    5.77 cm/s LV PW:         1.13 cm  LV E/e' medial:  11.2 LV IVS:        1.25 cm  LV e' lateral:   8.49 cm/s LVOT diam:     2.00 cm  LV E/e' lateral:  7.6 LV SV:         91 LV SV Index:   45 LVOT Area:     3.14 cm  RIGHT VENTRICLE RV S prime:     16.90 cm/s TAPSE (M-mode): 1.9 cm LEFT ATRIUM             Index       RIGHT ATRIUM           Index LA diam:        3.70 cm 1.85 cm/m  RA Area:     10.20 cm LA Vol (A2C):   41.7 ml 20.89 ml/m RA Volume:   23.60 ml  11.82 ml/m LA Vol (A4C):   36.6 ml 18.34 ml/m LA Biplane Vol: 39.6 ml 19.84 ml/m  AORTIC VALVE AV Area (Vmax):    3.53 cm AV Area (Vmean):   3.33 cm AV Area (VTI):     3.93 cm AV Vmax:           116.35 cm/s AV Vmean:          84.084 cm/s AV VTI:            0.231 m AV Peak Grad:      5.4 mmHg AV Mean Grad:      3.2 mmHg LVOT Vmax:         130.73 cm/s LVOT Vmean:        89.136 cm/s LVOT VTI:          0.289 m LVOT/AV VTI ratio: 1.25  AORTA Ao Root diam: 3.10 cm MITRAL VALVE MV Area (PHT): 2.46 cm     SHUNTS MV Decel Time: 309 msec     Systemic VTI:  0.29 m MV E velocity: 64.50 cm/s   Systemic Diam: 2.00 cm MV A velocity: 133.00 cm/s MV E/A ratio:  0.48 Carlyle Dolly MD Electronically signed by Carlyle Dolly MD Signature Date/Time: 11/16/2019/10:52:08 AM    Final        Medical Problem List and Plan: 1.  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  Antithrombotics: -DVT/anticoagulation: Lovenox  -antiplatelet therapy: Plavix 75 mg daily 3. Pain Management: Tylenol as needed 4. Mood: Provide emotional support  -antipsychotic agents: N/A 5. Neuropsych: This patient is capable of making decisions on his own behalf. 6. Skin/Wound Care: Routine skin checks 7. Fluids/Electrolytes/Nutrition: Routine in and outs.  CMP ordered. 8.  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  Follow-up  outpatient 9.  Hyperlipidemia.  Lipitor 10.  BPH.  Resume tamsulosin 11.  Elevated blood pressure reading  Monitor with increased mobility  Cathlyn Parsons, PA-C 11/17/2019  I have personally performed a face to face diagnostic evaluation, including, but not limited to relevant history and physical exam findings, of this patient and developed relevant assessment and plan.  Additionally, I have reviewed and concur with the physician assistant's documentation above.  Delice Lesch, MD, ABPMR  The patient's status has not changed. Any changes from the pre-admission screening or documentation from the acute chart are noted above.   Delice Lesch, MD, ABPMR

## 2019-11-17 NOTE — Progress Notes (Signed)
Entered patients room at 0330 for stroke vitals/assessment. Pt noted to be sitting on the floor in front of bed, on his sacrum with back against bed. Soiled linen was balled up beside patient. Urinal noted to have 100cc's of urine hanging on side of trash can beside patient as well as his left hearing aide. IV out and in trash can. Patient denies pain or hitting his head at this time. No changes not to ROM, continues with prior deficits. 2 person assist to help back in bed. Vitals and body assessed for injuries. Abrasion noted to left knee, patient states "must have rubbed it there when I slid out of bed" pointing to bed railing. Charge nurse Deeann Dowse made aware of fall and assessed abrasion. This nurse cleansed area and left open to air, redness noted. Dr. Josephine Cables made aware of fall and abrasion, no new orders given. AC Gina made aware of above. Patient does not wish to notify family of fall at this time. Condom cath placed on pt. Bed alarm on, floor mats in place, call bell and personal items within reach. Advised pt the importance of calling for help d/t his right-sided weakness, patient states he understands.

## 2019-11-17 NOTE — TOC Transition Note (Signed)
Transition of Care [facility]) - CM/SW Discharge Note   Patient Details  Name: Coby Shrewsberry MRN: [id number] Date of Birth: 1950/03/23  [facility]) CM/SW Contact:  Boneta Lucks, RN Phone Number: 11/17/2019, 10:31 AM   Clinical Narrative:   CIR has insurance authorization, Georgette Dover, RN to call report, Medical necessity form completed.    Final next level of care: Acute to Acute Transfer (CIR) Barriers to Discharge: Barriers Resolved   Patient Goals and CMS Choice Patient states their goals for this hospitalization and ongoing recovery are:: To go to CIR CMS Medicare.gov Compare Post Acute Care list provided to:: Patient Choice offered to / list presented to : Patient  Discharge Placement              Patient chooses bed at: Other - please specify in the comment section below: (CIR) Patient to be transferred to facility by: [place] Name of family member notified: Girl-friend at the bedside Patient and family notified of of transfer: 11/17/19

## 2019-11-17 NOTE — Progress Notes (Signed)
Inpatient Rehabilitation Admissions Coordinator  I have insurance approval and CIR/inpt rehab bed at [facility] to admit this patient today. I have clearance from Dr. Carles Collet, I spoke with patient and his girlfriend, [name] at bedside. I have alerted acute team and TOC. I will contact [facility] for transport. He will be admitted to [facility] . I spoke with RN Beverlee Nims.  Danne Baxter, RN, MSN Rehab Admissions Coordinator [phone] 11/17/2019 10:09 AM

## 2019-11-17 NOTE — Discharge Summary (Signed)
Physician Discharge Summary  Philip Stone YHC:[id number] DOB: [birth year] DOA: 11/15/2019  PCP: Claretta Fraise, MD  Admit date: 11/15/2019 Discharge date: 11/17/2019  Admitted From: Home Disposition:  CIR  Recommendations for Outpatient Follow-up:  1. Follow up with PCP in 1-2 weeks 2. Please obtain BMP/CBC in one week 3. Will ultimately need imaging of neck to clarify soft tissue fullness     Discharge Condition: Stable CODE STATUS: FULL Diet recommendation: Heart Healthy    Brief/Interim Summary: 69 year old male with a history of Charcot-Marie-Tooth disease and right foot drop, cervical stenosis status post decompression, and GERD presenting with right face and right-sided numbness.  The patient states that his symptoms began on 11/13/2019 with dizziness and lightheadedness and right facial numbness as well as blurry vision on the right side.  His symptoms were waxing and waning at that time, but he visited the emergency department on 11/14/2019.  MRI of the brain on that day was negative for any acute findings.  The patient was subsequently discharged home in stable condition with instructions to follow-up with [facility] neurology.  His symptoms continue to progress with increasing right upper extremity and right lower extremity numbness and right-sided weakness.  He also complained of a bifrontal headache.  He denied any loss of consciousness.  as result, he represented to the emergency department on 11/15/2019.  He also complained of some word finding difficulties.  He denied any fevers, chills, chest pain, shortness of breath, coughing, hemoptysis, nausea, vomiting, diarrhea, abdominal pain, dysuria, hematuria. In the emergency department, the patient has been afebrile and hemodynamically stable without hypoxia.  BMP, LFTs, and CBC were essentially unremarkable except for thrombocytopenia.  Neurology was consulted to assist with management.  Discharge Diagnoses:  Acute ischemic stroke  (Left PCA territory) -Neurology Consulted -PT/OT evaluation-->CIR -Speech therapy eval--expressive aphasia impacting some cognitive deficit -10/6 MRI brain--acute ischemic left PCA infarct involving the left temporal occipital lobe and thalamus -10/6 MRA brain--interval occlusion of the left PCA.  Patent vertebral arteries.  Mild short segment stenosis left ICA -Echo--EF 60-65%, G1DD, no WMA -LDL--125 -HbA1C--5.6 -Antiplatelet--plavix given allergy to ASA  Thrombocytopenia -This appears to be chronic dating back to March 5176 -H60--737 -Folic TGGY--69.4 -Monitor CBC and for signs of bleeding -TSH--1.635 -stable level without signs of bleeding  Hyperlipidemia -started atorvastatin -LDL 125  Essential Hypertension -SBP consistently in 160s during hospitalization -not on any agents prior to admssion -allowing for permissive HTN during hospitalization -start amlodipine after d/c  GERD -Continue PPI  Hypokalemia -Repleted -Magnesium 2.0  Charcot-Marie-Tooth disease -PT evaluation -Outpatient follow-up  Left neck lymph node -noted by Dr. Merlene Laughter -I personally examined examined patient/neck-->no enlarged lymph nodes;  There is mild soft tissue fullness, ill-defined, nontender, freely mobile just lateral of midline in area of thyroid -patient and significant other made aware -stable for outpatient imaging and follow up  Discharge Instructions   Allergies as of 11/17/2019      Reactions   Asa [aspirin] Anaphylaxis   Prednisone    Makes him lightheaded      Medication List    TAKE these medications   amLODipine 5 MG tablet Commonly known as: NORVASC Take 1 tablet (5 mg total) by mouth daily.   atorvastatin 40 MG tablet Commonly known as: LIPITOR Take 1 tablet (40 mg total) by mouth daily. Start taking on: November 18, 2019   clopidogrel 75 MG tablet Commonly known as: PLAVIX Take 1 tablet (75 mg total) by mouth daily. Start taking on: November 18, 2019  pantoprazole 40 MG tablet Commonly known as: PROTONIX Take 1 tablet (40 mg total) by mouth daily.   tamsulosin 0.4 MG Caps capsule Commonly known as: FLOMAX TAKE TWO CAPSULES BY MOUTH DAILY AT BEDTIME What changed: See the new instructions.       Allergies  Allergen Reactions  . Asa [Aspirin] Anaphylaxis  . Prednisone     Makes him lightheaded    Consultations:  neurology   Procedures/Studies: MR ANGIO HEAD WO CONTRAST  Result Date: 11/15/2019 CLINICAL DATA:  Initial evaluation for acute neuro deficit, stroke suspected. EXAM: MRI HEAD WITHOUT CONTRAST MRA HEAD WITHOUT CONTRAST MRA NECK WITHOUT AND WITH CONTRAST TECHNIQUE: Multiplanar, multiecho pulse sequences of the brain and surrounding structures were obtained without intravenous contrast. Angiographic images of the Circle of Willis were obtained using MRA technique without intravenous contrast. Angiographic images of the neck were obtained using MRA technique without and with intravenous contrast. Carotid stenosis measurements (when applicable) are obtained utilizing NASCET criteria, using the distal internal carotid diameter as the denominator. CONTRAST:  68mL GADAVIST GADOBUTROL 1 MMOL/ML IV SOLN COMPARISON:  Recent MRI/MRA from 11/14/2019. FINDINGS: MRI HEAD FINDINGS Brain: There has been interval development of patchy restricted diffusion involving the parasagittal left temporal occipital region, compatible with an acute left PCA territory infarct, overall moderate in size. Involvement is greatest at the mesial left temporal lobe/left hippocampal formation. Mild patchy involvement of the left thalamus noted as well. Additional punctate cortical infarct noted superiorly at the paramedian left parietal lobe (series 5, image thirty-five). No associated hemorrhage or mass effect. No other evidence for acute or interval infarction. Gray-white matter differentiation otherwise maintained. No susceptibility artifact to suggest acute or  chronic intracranial hemorrhage. Underlying atrophy with moderate chronic microvascular ischemic disease again noted. No mass lesion, mass effect, or midline shift. No hydrocephalus or extra-axial fluid collection. Pituitary gland and suprasellar region normal. Midline structures intact. Vascular: Major intracranial vascular flow voids are maintained. Skull and upper cervical spine: Craniocervical junction within normal limits. Postsurgical changes partially visualize within the upper cervical spine. Bone marrow signal intensity normal. No scalp soft tissue abnormality. Sinuses/Orbits: Subtle flattening with T2 hypointense thickening noted at the posterolateral aspects of the globes bilaterally, right slightly greater than left (series 10, image 8), of uncertain significance. Globes and orbital soft tissues otherwise unremarkable. Scattered mucosal thickening noted within the frontoethmoidal sinuses. Paranasal sinuses are otherwise clear. No significant mastoid effusion. Inner ear structures grossly normal. Other: None. MRA HEAD FINDINGS ANTERIOR CIRCULATION: Distal cervical segments of the internal carotid arteries are widely patent with antegrade flow. Mild atheromatous irregularity throughout the carotid siphons without hemodynamically significant stenosis or other abnormality. A1 segments, anterior communicating artery complex, and anterior cerebral arteries widely patent and within normal limits. No M1 stenosis or occlusion. Distal MCA branches well perfused and symmetric. POSTERIOR CIRCULATION: Vertebral arteries widely patent to the vertebrobasilar junction. Left vertebral artery strongly dominant, with a diffusely hypoplastic right vertebral artery. Both picas patent. Basilar widely patent to its distal aspect. Superior cerebral arteries patent bilaterally. Right PCA supplied via the basilar remains widely patent. Interval occlusion of the left PCA since previous exam. Some attenuated flow is seen within the  left posterior communicating artery. No intracranial aneurysm. MRA NECK FINDINGS AORTIC ARCH: Visualized aortic arch of normal caliber. Bovine branching pattern with common origin of the innominate and left common carotid arteries noted. No hemodynamically significant stenosis seen about the origin of the great vessels. RIGHT CAROTID SYSTEM: Right CCA widely patent from its origin to  the bifurcation without stenosis. No significant atheromatous narrowing about the right bifurcation. Right ICA widely patent to the skull base without stenosis or other abnormality. LEFT CAROTID SYSTEM: Left CCA widely patent from its origin to the bifurcation without stenosis. No more than mild short-segment 20% stenosis at the origin of the left ICA. Left ICA otherwise widely patent distally without stenosis or other vascular abnormality. VERTEBRAL ARTERIES: Both vertebral arteries arise from subclavian arteries. No proximal subclavian artery stenosis. Left vertebral artery strongly dominant, with a diffusely hypoplastic right vertebral artery. Both vertebral arteries widely patent within the neck without stenosis or occlusion. IMPRESSION: MRI HEAD IMPRESSION: 1. 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 No associated mass effect. 2. Underlying atrophy with moderate chronic microvascular ischemic disease. 3. Subtle flattening with T2 hypointense thickening at the posterolateral aspects of the globes bilaterally, of uncertain significance. Correlation with history and funduscopic exam suggested. MRA HEAD IMPRESSION: 1. Interval occlusion of the left PCA since previous exam. 2. Otherwise stable intracranial MRA. No other large vessel occlusion. No other hemodynamically significant or correctable stenosis. MRA NECK IMPRESSION: 1. No more than mild short-segment stenosis at the origin of the left  ICA. Otherwise wide patency of both carotid artery systems within the neck. 2. Both vertebral arteries widely patent within the neck. Left vertebral artery strongly dominant, with a diffusely hypoplastic right vertebral artery. Electronically Signed   By: Jeannine Boga M.D.   On: 11/15/2019 20:13   MR ANGIO HEAD WO CONTRAST  Result Date: 11/14/2019 CLINICAL DATA:  Right facial numbness, blurry vision EXAM: MRI HEAD WITHOUT CONTRAST MRA HEAD WITHOUT CONTRAST TECHNIQUE: Multiplanar, multiecho pulse sequences of the brain and surrounding structures were obtained without intravenous contrast. Angiographic images of the head were obtained using MRA technique without contrast. COMPARISON:  None. FINDINGS: MRI HEAD Brain: There is no acute infarction or intracranial hemorrhage. There is no intracranial mass, mass effect, or edema. There is no hydrocephalus or extra-axial fluid collection. Ventricles and sulci are within normal limits in size and configuration. Patchy foci of T2 hyperintensity in the supratentorial white matter are nonspecific but may reflect mild to moderate chronic microvascular ischemic changes in a patient of this age. 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 Vascular: Major vessel flow voids at the skull base are preserved. Skull and upper cervical spine: Normal marrow signal is preserved. Sinuses/Orbits: Paranasal sinuses are aerated. Orbits are unremarkable. Other: Sella is unremarkable.  Mastoid air cells are clear. MRA HEAD Degraded by artifact. Intracranial internal carotid arteries are patent. Middle and anterior cerebral arteries are patent. Visual intracranial vertebral arteries are patent. The basilar artery is patent. The posterior cerebral arteries are patent. There is fetal origin of the left PCA. 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 There is no aneurysm. IMPRESSION: No acute infarction, hemorrhage, or mass. Moderate  chronic microvascular ischemic changes. 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 Electronically Signed   By: Macy Mis M.D.   On: 11/14/2019 13:38   MR ANGIO NECK W WO CONTRAST  Result Date: 11/15/2019 CLINICAL DATA:  Initial evaluation for acute neuro deficit, stroke suspected. EXAM: MRI HEAD WITHOUT CONTRAST MRA HEAD WITHOUT CONTRAST MRA NECK WITHOUT AND WITH CONTRAST TECHNIQUE: Multiplanar, multiecho pulse sequences of the brain and surrounding structures were obtained without intravenous contrast. Angiographic images of the Circle of Willis were obtained  using MRA technique without intravenous contrast. Angiographic images of the neck were obtained using MRA technique without and with intravenous contrast. Carotid stenosis measurements (when applicable) are obtained utilizing NASCET criteria, using the distal internal carotid diameter as the denominator. CONTRAST:  55mL GADAVIST GADOBUTROL 1 MMOL/ML IV SOLN COMPARISON:  Recent MRI/MRA from 11/14/2019. FINDINGS: MRI HEAD FINDINGS Brain: There has been interval development of patchy restricted diffusion involving the parasagittal left temporal occipital region, compatible with an acute left PCA territory infarct, overall moderate in size. Involvement is greatest at the mesial left temporal lobe/left hippocampal formation. Mild patchy involvement of the left thalamus noted as well. Additional punctate cortical infarct noted superiorly at the paramedian left parietal lobe (series 5, image thirty-five). No associated hemorrhage or mass effect. No other evidence for acute or interval infarction. Gray-white matter differentiation otherwise maintained. No susceptibility artifact to suggest acute or chronic intracranial hemorrhage. Underlying atrophy with moderate chronic microvascular ischemic disease again noted. No mass lesion, mass effect, or midline shift. No hydrocephalus or extra-axial fluid collection. Pituitary gland and  suprasellar region normal. Midline structures intact. Vascular: Major intracranial vascular flow voids are maintained. Skull and upper cervical spine: Craniocervical junction within normal limits. Postsurgical changes partially visualize within the upper cervical spine. Bone marrow signal intensity normal. No scalp soft tissue abnormality. Sinuses/Orbits: Subtle flattening with T2 hypointense thickening noted at the posterolateral aspects of the globes bilaterally, right slightly greater than left (series 10, image 8), of uncertain significance. Globes and orbital soft tissues otherwise unremarkable. Scattered mucosal thickening noted within the frontoethmoidal sinuses. Paranasal sinuses are otherwise clear. No significant mastoid effusion. Inner ear structures grossly normal. Other: None. MRA HEAD FINDINGS ANTERIOR CIRCULATION: Distal cervical segments of the internal carotid arteries are widely patent with antegrade flow. Mild atheromatous irregularity throughout the carotid siphons without hemodynamically significant stenosis or other abnormality. A1 segments, anterior communicating artery complex, and anterior cerebral arteries widely patent and within normal limits. No M1 stenosis or occlusion. Distal MCA branches well perfused and symmetric. POSTERIOR CIRCULATION: Vertebral arteries widely patent to the vertebrobasilar junction. Left vertebral artery strongly dominant, with a diffusely hypoplastic right vertebral artery. Both picas patent. Basilar widely patent to its distal aspect. Superior cerebral arteries patent bilaterally. Right PCA supplied via the basilar remains widely patent. Interval occlusion of the left PCA since previous exam. Some attenuated flow is seen within the left posterior communicating artery. No intracranial aneurysm. MRA NECK FINDINGS AORTIC ARCH: Visualized aortic arch of normal caliber. Bovine branching pattern with common origin of the innominate and left common carotid arteries  noted. No hemodynamically significant stenosis seen about the origin of the great vessels. RIGHT CAROTID SYSTEM: Right CCA widely patent from its origin to the bifurcation without stenosis. No significant atheromatous narrowing about the right bifurcation. Right ICA widely patent to the skull base without stenosis or other abnormality. LEFT CAROTID SYSTEM: Left CCA widely patent from its origin to the bifurcation without stenosis. No more than mild short-segment 20% stenosis at the origin of the left ICA. Left ICA otherwise widely patent distally without stenosis or other vascular abnormality. VERTEBRAL ARTERIES: Both vertebral arteries arise from subclavian arteries. No proximal subclavian artery stenosis. Left vertebral artery strongly dominant, with a diffusely hypoplastic right vertebral artery. Both vertebral arteries widely patent within the neck without stenosis or occlusion. IMPRESSION: MRI HEAD IMPRESSION: 1. 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 No associated mass  effect. 2. Underlying atrophy with moderate chronic microvascular ischemic disease. 3. Subtle flattening with T2 hypointense thickening at the posterolateral aspects of the globes bilaterally, of uncertain significance. Correlation with history and funduscopic exam suggested. MRA HEAD IMPRESSION: 1. Interval occlusion of the left PCA since previous exam. 2. Otherwise stable intracranial MRA. No other large vessel occlusion. No other hemodynamically significant or correctable stenosis. MRA NECK IMPRESSION: 1. No more than mild short-segment stenosis at the origin of the left ICA. Otherwise wide patency of both carotid artery systems within the neck. 2. Both vertebral arteries widely patent within the neck. Left vertebral artery strongly dominant, with a diffusely hypoplastic right vertebral artery.  Electronically Signed   By: Jeannine Boga M.D.   On: 11/15/2019 20:13   MR BRAIN WO CONTRAST  Result Date: 11/15/2019 CLINICAL DATA:  Initial evaluation for acute neuro deficit, stroke suspected. EXAM: MRI HEAD WITHOUT CONTRAST MRA HEAD WITHOUT CONTRAST MRA NECK WITHOUT AND WITH CONTRAST TECHNIQUE: Multiplanar, multiecho pulse sequences of the brain and surrounding structures were obtained without intravenous contrast. Angiographic images of the Circle of Willis were obtained using MRA technique without intravenous contrast. Angiographic images of the neck were obtained using MRA technique without and with intravenous contrast. Carotid stenosis measurements (when applicable) are obtained utilizing NASCET criteria, using the distal internal carotid diameter as the denominator. CONTRAST:  6mL GADAVIST GADOBUTROL 1 MMOL/ML IV SOLN COMPARISON:  Recent MRI/MRA from 11/14/2019. FINDINGS: MRI HEAD FINDINGS Brain: There has been interval development of patchy restricted diffusion involving the parasagittal left temporal occipital region, compatible with an acute left PCA territory infarct, overall moderate in size. Involvement is greatest at the mesial left temporal lobe/left hippocampal formation. Mild patchy involvement of the left thalamus noted as well. Additional punctate cortical infarct noted superiorly at the paramedian left parietal lobe (series 5, image thirty-five). No associated hemorrhage or mass effect. No other evidence for acute or interval infarction. Gray-white matter differentiation otherwise maintained. No susceptibility artifact to suggest acute or chronic intracranial hemorrhage. Underlying atrophy with moderate chronic microvascular ischemic disease again noted. No mass lesion, mass effect, or midline shift. No hydrocephalus or extra-axial fluid collection. Pituitary gland and suprasellar region normal. Midline structures intact. Vascular: Major intracranial vascular flow voids are  maintained. Skull and upper cervical spine: Craniocervical junction within normal limits. Postsurgical changes partially visualize within the upper cervical spine. Bone marrow signal intensity normal. No scalp soft tissue abnormality. Sinuses/Orbits: Subtle flattening with T2 hypointense thickening noted at the posterolateral aspects of the globes bilaterally, right slightly greater than left (series 10, image 8), of uncertain significance. Globes and orbital soft tissues otherwise unremarkable. Scattered mucosal thickening noted within the frontoethmoidal sinuses. Paranasal sinuses are otherwise clear. No significant mastoid effusion. Inner ear structures grossly normal. Other: None. MRA HEAD FINDINGS ANTERIOR CIRCULATION: Distal cervical segments of the internal carotid arteries are widely patent with antegrade flow. Mild atheromatous irregularity throughout the carotid siphons without hemodynamically significant stenosis or other abnormality. A1 segments, anterior communicating artery complex, and anterior cerebral arteries widely patent and within normal limits. No M1 stenosis or occlusion. Distal MCA branches well perfused and symmetric. POSTERIOR CIRCULATION: Vertebral arteries widely patent to the vertebrobasilar junction. Left vertebral artery strongly dominant, with a diffusely hypoplastic right vertebral artery. Both picas patent. Basilar widely patent to its distal aspect. Superior cerebral arteries patent bilaterally. Right PCA supplied via the basilar remains widely patent. Interval occlusion of the left PCA since previous exam. Some attenuated flow is seen within the left posterior communicating  artery. No intracranial aneurysm. MRA NECK FINDINGS AORTIC ARCH: Visualized aortic arch of normal caliber. Bovine branching pattern with common origin of the innominate and left common carotid arteries noted. No hemodynamically significant stenosis seen about the origin of the great vessels. RIGHT CAROTID  SYSTEM: Right CCA widely patent from its origin to the bifurcation without stenosis. No significant atheromatous narrowing about the right bifurcation. Right ICA widely patent to the skull base without stenosis or other abnormality. LEFT CAROTID SYSTEM: Left CCA widely patent from its origin to the bifurcation without stenosis. No more than mild short-segment 20% stenosis at the origin of the left ICA. Left ICA otherwise widely patent distally without stenosis or other vascular abnormality. VERTEBRAL ARTERIES: Both vertebral arteries arise from subclavian arteries. No proximal subclavian artery stenosis. Left vertebral artery strongly dominant, with a diffusely hypoplastic right vertebral artery. Both vertebral arteries widely patent within the neck without stenosis or occlusion. IMPRESSION: MRI HEAD IMPRESSION: 1. 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 No associated mass effect. 2. Underlying atrophy with moderate chronic microvascular ischemic disease. 3. Subtle flattening with T2 hypointense thickening at the posterolateral aspects of the globes bilaterally, of uncertain significance. Correlation with history and funduscopic exam suggested. MRA HEAD IMPRESSION: 1. Interval occlusion of the left PCA since previous exam. 2. Otherwise stable intracranial MRA. No other large vessel occlusion. No other hemodynamically significant or correctable stenosis. MRA NECK IMPRESSION: 1. No more than mild short-segment stenosis at the origin of the left ICA. Otherwise wide patency of both carotid artery systems within the neck. 2. Both vertebral arteries widely patent within the neck. Left vertebral artery strongly dominant, with a diffusely hypoplastic right vertebral artery. Electronically Signed   By: Jeannine Boga M.D.   On: 11/15/2019 20:13   MR BRAIN WO CONTRAST   Result Date: 11/14/2019 CLINICAL DATA:  Right facial numbness, blurry vision EXAM: MRI HEAD WITHOUT CONTRAST MRA HEAD WITHOUT CONTRAST TECHNIQUE: Multiplanar, multiecho pulse sequences of the brain and surrounding structures were obtained without intravenous contrast. Angiographic images of the head were obtained using MRA technique without contrast. COMPARISON:  None. FINDINGS: MRI HEAD Brain: There is no acute infarction or intracranial hemorrhage. There is no intracranial mass, mass effect, or edema. There is no hydrocephalus or extra-axial fluid collection. Ventricles and sulci are within normal limits in size and configuration. Patchy foci of T2 hyperintensity in the supratentorial white matter are nonspecific but may reflect mild to moderate chronic microvascular ischemic changes in a patient of this age. 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 Vascular: Major vessel flow voids at the skull base are preserved. Skull and upper cervical spine: Normal marrow signal is preserved. Sinuses/Orbits: Paranasal sinuses are aerated. Orbits are unremarkable. Other: Sella is unremarkable.  Mastoid air cells are clear. MRA HEAD Degraded by artifact. Intracranial internal carotid arteries are patent. Middle and anterior cerebral arteries are patent. Visual intracranial vertebral arteries are patent. The basilar artery is patent. The posterior cerebral arteries are patent. There is fetal origin of the left PCA. 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 There is no aneurysm. IMPRESSION: No acute infarction, hemorrhage, or mass. Moderate chronic microvascular ischemic changes. 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 Electronically Signed   By: Macy Mis M.D.   On: 11/14/2019 13:38   ECHOCARDIOGRAM COMPLETE  Result Date: 11/16/2019  ECHOCARDIOGRAM REPORT   Patient Name:   LIVINGSTON Lenahan Date of Exam: 11/16/2019 Medical Rec #:  [id number]     Height:       70.0 in Accession #:    [id number]   Weight:       180.0 lb Date of Birth:  Oct 19, 1950    BSA:          1.996 m Patient Age:    31 years     BP:           155/82 mmHg Patient Gender: M            HR:           67 bpm. Exam Location:  [facility] Procedure: 2D Echo Indications:    Stroke 434.91 / I163.9  History:        Patient has no prior history of Echocardiogram examinations.                 Stroke; Risk Factors:Former Smoker. CMT (Charcot-Marie-Tooth                 disease, GERD.  Sonographer:    Leavy Cella RDCS (AE) Referring Phys: [id number] OLADAPO ADEFESO IMPRESSIONS  1. Left ventricular ejection fraction, by estimation, is 60 to 65%. The left ventricle has normal function. The left ventricle has no regional wall motion abnormalities. There is mild left ventricular hypertrophy. Left ventricular diastolic parameters are consistent with Grade I diastolic dysfunction (impaired relaxation).  2. Right ventricular systolic function is normal. The right ventricular size is normal.  3. The mitral valve is normal in structure. No evidence of mitral valve regurgitation. No evidence of mitral stenosis.  4. The aortic valve is tricuspid. Aortic valve regurgitation is not visualized. No aortic stenosis is present.  5. The inferior vena cava is normal in size with greater than 50% respiratory variability, suggesting right atrial pressure of 3 mmHg. FINDINGS  Left Ventricle: Left ventricular ejection fraction, by estimation, is 60 to 65%. The left ventricle has normal function. The left ventricle has no regional wall motion abnormalities. The left ventricular internal cavity size was normal in size. There is  mild left ventricular hypertrophy. Left ventricular diastolic parameters are consistent with Grade I diastolic dysfunction (impaired relaxation). Normal left ventricular filling pressure. Right Ventricle: The right ventricular size is normal. No increase in right ventricular wall thickness. Right  ventricular systolic function is normal. Left Atrium: Left atrial size was normal in size. Right Atrium: Right atrial size was normal in size. Pericardium: There is no evidence of pericardial effusion. Mitral Valve: The mitral valve is normal in structure. No evidence of mitral valve regurgitation. No evidence of mitral valve stenosis. Tricuspid Valve: The tricuspid valve is normal in structure. Tricuspid valve regurgitation is trivial. No evidence of tricuspid stenosis. Aortic Valve: The aortic valve is tricuspid. Aortic valve regurgitation is not visualized. No aortic stenosis is present. Aortic valve mean gradient measures 3.2 mmHg. Aortic valve peak gradient measures 5.4 mmHg. Aortic valve area, by VTI measures 3.93 cm. Pulmonic Valve: The pulmonic valve was not well visualized. Pulmonic valve regurgitation is not visualized. No evidence of pulmonic stenosis. Aorta: The aortic root is normal in size and structure. Venous: The inferior vena cava is normal in size with greater than 50% respiratory variability, suggesting right atrial pressure of 3 mmHg. IAS/Shunts: The interatrial septum was not well visualized.  LEFT VENTRICLE PLAX 2D LVIDd:         4.39 cm  Diastology LVIDs:  2.15 cm  LV e' medial:    5.77 cm/s LV PW:         1.13 cm  LV E/e' medial:  11.2 LV IVS:        1.25 cm  LV e' lateral:   8.49 cm/s LVOT diam:     2.00 cm  LV E/e' lateral: 7.6 LV SV:         91 LV SV Index:   45 LVOT Area:     3.14 cm  RIGHT VENTRICLE RV S prime:     16.90 cm/s TAPSE (M-mode): 1.9 cm LEFT ATRIUM             Index       RIGHT ATRIUM           Index LA diam:        3.70 cm 1.85 cm/m  RA Area:     10.20 cm LA Vol (A2C):   41.7 ml 20.89 ml/m RA Volume:   23.60 ml  11.82 ml/m LA Vol (A4C):   36.6 ml 18.34 ml/m LA Biplane Vol: 39.6 ml 19.84 ml/m  AORTIC VALVE AV Area (Vmax):    3.53 cm AV Area (Vmean):   3.33 cm AV Area (VTI):     3.93 cm AV Vmax:           116.35 cm/s AV Vmean:          84.084 cm/s AV VTI:             0.231 m AV Peak Grad:      5.4 mmHg AV Mean Grad:      3.2 mmHg LVOT Vmax:         130.73 cm/s LVOT Vmean:        89.136 cm/s LVOT VTI:          0.289 m LVOT/AV VTI ratio: 1.25  AORTA Ao Root diam: 3.10 cm MITRAL VALVE MV Area (PHT): 2.46 cm     SHUNTS MV Decel Time: 309 msec     Systemic VTI:  0.29 m MV E velocity: 64.50 cm/s   Systemic Diam: 2.00 cm MV A velocity: 133.00 cm/s MV E/A ratio:  0.48 Carlyle Dolly MD Electronically signed by Carlyle Dolly MD Signature Date/Time: 11/16/2019/10:52:08 AM    Final         Discharge Exam: Vitals:   11/16/19 2329 11/17/19 0339  BP: (!) 152/87 (!) 167/93  Pulse: 62 85  Resp: 20 20  Temp: 98 F (36.7 C) 98 F (36.7 C)  SpO2: 98% 96%   Vitals:   11/16/19 1930 11/16/19 2155 11/16/19 2329 11/17/19 0339  BP: (!) 160/88 (!) 152/79 (!) 152/87 (!) 167/93  Pulse: 65 65 62 85  Resp: 18 16 20 20   Temp: 98.6 F (37 C) 98.8 F (37.1 C) 98 F (36.7 C) 98 F (36.7 C)  TempSrc: Oral Oral Oral Oral  SpO2: 98% 96% 98% 96%  Weight:      Height:        General: Pt is alert, awake, not in acute distress Cardiovascular: RRR, S1/S2 +, no rubs, no gallops Respiratory: CTA bilaterally, no wheezing, no rhonchi Abdominal: Soft, NT, ND, bowel sounds + Extremities: no edema, no cyanosis   The results of significant diagnostics from this hospitalization (including imaging, microbiology, ancillary and laboratory) are listed below for reference.    Significant Diagnostic Studies: MR ANGIO HEAD WO CONTRAST  Result Date: 11/15/2019 CLINICAL DATA:  Initial evaluation for acute neuro deficit, stroke  suspected. EXAM: MRI HEAD WITHOUT CONTRAST MRA HEAD WITHOUT CONTRAST MRA NECK WITHOUT AND WITH CONTRAST TECHNIQUE: Multiplanar, multiecho pulse sequences of the brain and surrounding structures were obtained without intravenous contrast. Angiographic images of the Circle of Willis were obtained using MRA technique without intravenous contrast. Angiographic  images of the neck were obtained using MRA technique without and with intravenous contrast. Carotid stenosis measurements (when applicable) are obtained utilizing NASCET criteria, using the distal internal carotid diameter as the denominator. CONTRAST:  23mL GADAVIST GADOBUTROL 1 MMOL/ML IV SOLN COMPARISON:  Recent MRI/MRA from 11/14/2019. FINDINGS: MRI HEAD FINDINGS Brain: There has been interval development of patchy restricted diffusion involving the parasagittal left temporal occipital region, compatible with an acute left PCA territory infarct, overall moderate in size. Involvement is greatest at the mesial left temporal lobe/left hippocampal formation. Mild patchy involvement of the left thalamus noted as well. Additional punctate cortical infarct noted superiorly at the paramedian left parietal lobe (series 5, image thirty-five). No associated hemorrhage or mass effect. No other evidence for acute or interval infarction. Gray-white matter differentiation otherwise maintained. No susceptibility artifact to suggest acute or chronic intracranial hemorrhage. Underlying atrophy with moderate chronic microvascular ischemic disease again noted. No mass lesion, mass effect, or midline shift. No hydrocephalus or extra-axial fluid collection. Pituitary gland and suprasellar region normal. Midline structures intact. Vascular: Major intracranial vascular flow voids are maintained. Skull and upper cervical spine: Craniocervical junction within normal limits. Postsurgical changes partially visualize within the upper cervical spine. Bone marrow signal intensity normal. No scalp soft tissue abnormality. Sinuses/Orbits: Subtle flattening with T2 hypointense thickening noted at the posterolateral aspects of the globes bilaterally, right slightly greater than left (series 10, image 8), of uncertain significance. Globes and orbital soft tissues otherwise unremarkable. Scattered mucosal thickening noted within the frontoethmoidal  sinuses. Paranasal sinuses are otherwise clear. No significant mastoid effusion. Inner ear structures grossly normal. Other: None. MRA HEAD FINDINGS ANTERIOR CIRCULATION: Distal cervical segments of the internal carotid arteries are widely patent with antegrade flow. Mild atheromatous irregularity throughout the carotid siphons without hemodynamically significant stenosis or other abnormality. A1 segments, anterior communicating artery complex, and anterior cerebral arteries widely patent and within normal limits. No M1 stenosis or occlusion. Distal MCA branches well perfused and symmetric. POSTERIOR CIRCULATION: Vertebral arteries widely patent to the vertebrobasilar junction. Left vertebral artery strongly dominant, with a diffusely hypoplastic right vertebral artery. Both picas patent. Basilar widely patent to its distal aspect. Superior cerebral arteries patent bilaterally. Right PCA supplied via the basilar remains widely patent. Interval occlusion of the left PCA since previous exam. Some attenuated flow is seen within the left posterior communicating artery. No intracranial aneurysm. MRA NECK FINDINGS AORTIC ARCH: Visualized aortic arch of normal caliber. Bovine branching pattern with common origin of the innominate and left common carotid arteries noted. No hemodynamically significant stenosis seen about the origin of the great vessels. RIGHT CAROTID SYSTEM: Right CCA widely patent from its origin to the bifurcation without stenosis. No significant atheromatous narrowing about the right bifurcation. Right ICA widely patent to the skull base without stenosis or other abnormality. LEFT CAROTID SYSTEM: Left CCA widely patent from its origin to the bifurcation without stenosis. No more than mild short-segment 20% stenosis at the origin of the left ICA. Left ICA otherwise widely patent distally without stenosis or other vascular abnormality. VERTEBRAL ARTERIES: Both vertebral arteries arise from subclavian  arteries. No proximal subclavian artery stenosis. Left vertebral artery strongly dominant, with a diffusely hypoplastic right vertebral artery. Both vertebral  arteries widely patent within the neck without stenosis or occlusion. IMPRESSION: MRI HEAD IMPRESSION: 1. 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 No associated mass effect. 2. Underlying atrophy with moderate chronic microvascular ischemic disease. 3. Subtle flattening with T2 hypointense thickening at the posterolateral aspects of the globes bilaterally, of uncertain significance. Correlation with history and funduscopic exam suggested. MRA HEAD IMPRESSION: 1. Interval occlusion of the left PCA since previous exam. 2. Otherwise stable intracranial MRA. No other large vessel occlusion. No other hemodynamically significant or correctable stenosis. MRA NECK IMPRESSION: 1. No more than mild short-segment stenosis at the origin of the left ICA. Otherwise wide patency of both carotid artery systems within the neck. 2. Both vertebral arteries widely patent within the neck. Left vertebral artery strongly dominant, with a diffusely hypoplastic right vertebral artery. Electronically Signed   By: Jeannine Boga M.D.   On: 11/15/2019 20:13   MR ANGIO HEAD WO CONTRAST  Result Date: 11/14/2019 CLINICAL DATA:  Right facial numbness, blurry vision EXAM: MRI HEAD WITHOUT CONTRAST MRA HEAD WITHOUT CONTRAST TECHNIQUE: Multiplanar, multiecho pulse sequences of the brain and surrounding structures were obtained without intravenous contrast. Angiographic images of the head were obtained using MRA technique without contrast. COMPARISON:  None. FINDINGS: MRI HEAD Brain: There is no acute infarction or intracranial hemorrhage. There is no intracranial mass, mass effect, or edema. There is no hydrocephalus or extra-axial fluid  collection. Ventricles and sulci are within normal limits in size and configuration. Patchy foci of T2 hyperintensity in the supratentorial white matter are nonspecific but may reflect mild to moderate chronic microvascular ischemic changes in a patient of this age. 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 Vascular: Major vessel flow voids at the skull base are preserved. Skull and upper cervical spine: Normal marrow signal is preserved. Sinuses/Orbits: Paranasal sinuses are aerated. Orbits are unremarkable. Other: Sella is unremarkable.  Mastoid air cells are clear. MRA HEAD Degraded by artifact. Intracranial internal carotid arteries are patent. Middle and anterior cerebral arteries are patent. Visual intracranial vertebral arteries are patent. The basilar artery is patent. The posterior cerebral arteries are patent. There is fetal origin of the left PCA. 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 There is no aneurysm. IMPRESSION: No acute infarction, hemorrhage, or mass. Moderate chronic microvascular ischemic changes. 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 Electronically Signed   By: Macy Mis M.D.   On: 11/14/2019 13:38   MR ANGIO NECK W WO CONTRAST  Result Date: 11/15/2019 CLINICAL DATA:  Initial evaluation for acute neuro deficit, stroke suspected. EXAM: MRI HEAD WITHOUT CONTRAST MRA HEAD WITHOUT CONTRAST MRA NECK WITHOUT AND WITH CONTRAST TECHNIQUE: Multiplanar, multiecho pulse sequences of the brain and surrounding structures were obtained without intravenous contrast. Angiographic images of the Circle of Willis were obtained using MRA technique without intravenous contrast. Angiographic images of the neck were obtained using MRA technique without and with intravenous contrast. Carotid stenosis measurements (when applicable) are obtained utilizing NASCET criteria, using the distal internal carotid diameter as the  denominator. CONTRAST:  84mL GADAVIST GADOBUTROL 1 MMOL/ML IV SOLN COMPARISON:  Recent MRI/MRA from 11/14/2019. FINDINGS: MRI HEAD FINDINGS Brain: There has been interval development of patchy restricted diffusion involving the parasagittal left temporal occipital region, compatible with an acute left PCA territory infarct, overall moderate in size. Involvement is greatest at the mesial  left temporal lobe/left hippocampal formation. Mild patchy involvement of the left thalamus noted as well. Additional punctate cortical infarct noted superiorly at the paramedian left parietal lobe (series 5, image thirty-five). No associated hemorrhage or mass effect. No other evidence for acute or interval infarction. Gray-white matter differentiation otherwise maintained. No susceptibility artifact to suggest acute or chronic intracranial hemorrhage. Underlying atrophy with moderate chronic microvascular ischemic disease again noted. No mass lesion, mass effect, or midline shift. No hydrocephalus or extra-axial fluid collection. Pituitary gland and suprasellar region normal. Midline structures intact. Vascular: Major intracranial vascular flow voids are maintained. Skull and upper cervical spine: Craniocervical junction within normal limits. Postsurgical changes partially visualize within the upper cervical spine. Bone marrow signal intensity normal. No scalp soft tissue abnormality. Sinuses/Orbits: Subtle flattening with T2 hypointense thickening noted at the posterolateral aspects of the globes bilaterally, right slightly greater than left (series 10, image 8), of uncertain significance. Globes and orbital soft tissues otherwise unremarkable. Scattered mucosal thickening noted within the frontoethmoidal sinuses. Paranasal sinuses are otherwise clear. No significant mastoid effusion. Inner ear structures grossly normal. Other: None. MRA HEAD FINDINGS ANTERIOR CIRCULATION: Distal cervical segments of the internal carotid arteries  are widely patent with antegrade flow. Mild atheromatous irregularity throughout the carotid siphons without hemodynamically significant stenosis or other abnormality. A1 segments, anterior communicating artery complex, and anterior cerebral arteries widely patent and within normal limits. No M1 stenosis or occlusion. Distal MCA branches well perfused and symmetric. POSTERIOR CIRCULATION: Vertebral arteries widely patent to the vertebrobasilar junction. Left vertebral artery strongly dominant, with a diffusely hypoplastic right vertebral artery. Both picas patent. Basilar widely patent to its distal aspect. Superior cerebral arteries patent bilaterally. Right PCA supplied via the basilar remains widely patent. Interval occlusion of the left PCA since previous exam. Some attenuated flow is seen within the left posterior communicating artery. No intracranial aneurysm. MRA NECK FINDINGS AORTIC ARCH: Visualized aortic arch of normal caliber. Bovine branching pattern with common origin of the innominate and left common carotid arteries noted. No hemodynamically significant stenosis seen about the origin of the great vessels. RIGHT CAROTID SYSTEM: Right CCA widely patent from its origin to the bifurcation without stenosis. No significant atheromatous narrowing about the right bifurcation. Right ICA widely patent to the skull base without stenosis or other abnormality. LEFT CAROTID SYSTEM: Left CCA widely patent from its origin to the bifurcation without stenosis. No more than mild short-segment 20% stenosis at the origin of the left ICA. Left ICA otherwise widely patent distally without stenosis or other vascular abnormality. VERTEBRAL ARTERIES: Both vertebral arteries arise from subclavian arteries. No proximal subclavian artery stenosis. Left vertebral artery strongly dominant, with a diffusely hypoplastic right vertebral artery. Both vertebral arteries widely patent within the neck without stenosis or occlusion.  IMPRESSION: MRI HEAD IMPRESSION: 1. 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 No associated mass effect. 2. Underlying atrophy with moderate chronic microvascular ischemic disease. 3. Subtle flattening with T2 hypointense thickening at the posterolateral aspects of the globes bilaterally, of uncertain significance. Correlation with history and funduscopic exam suggested. MRA HEAD IMPRESSION: 1. Interval occlusion of the left PCA since previous exam. 2. Otherwise stable intracranial MRA. No other large vessel occlusion. No other hemodynamically significant or correctable stenosis. MRA NECK IMPRESSION: 1. No more than mild short-segment stenosis at the origin of the left ICA. Otherwise wide patency of both carotid artery systems within the neck. 2.  Both vertebral arteries widely patent within the neck. Left vertebral artery strongly dominant, with a diffusely hypoplastic right vertebral artery. Electronically Signed   By: Jeannine Boga M.D.   On: 11/15/2019 20:13   MR BRAIN WO CONTRAST  Result Date: 11/15/2019 CLINICAL DATA:  Initial evaluation for acute neuro deficit, stroke suspected. EXAM: MRI HEAD WITHOUT CONTRAST MRA HEAD WITHOUT CONTRAST MRA NECK WITHOUT AND WITH CONTRAST TECHNIQUE: Multiplanar, multiecho pulse sequences of the brain and surrounding structures were obtained without intravenous contrast. Angiographic images of the Circle of Willis were obtained using MRA technique without intravenous contrast. Angiographic images of the neck were obtained using MRA technique without and with intravenous contrast. Carotid stenosis measurements (when applicable) are obtained utilizing NASCET criteria, using the distal internal carotid diameter as the denominator. CONTRAST:  48mL GADAVIST GADOBUTROL 1 MMOL/ML IV SOLN COMPARISON:  Recent MRI/MRA from 11/14/2019.  FINDINGS: MRI HEAD FINDINGS Brain: There has been interval development of patchy restricted diffusion involving the parasagittal left temporal occipital region, compatible with an acute left PCA territory infarct, overall moderate in size. Involvement is greatest at the mesial left temporal lobe/left hippocampal formation. Mild patchy involvement of the left thalamus noted as well. Additional punctate cortical infarct noted superiorly at the paramedian left parietal lobe (series 5, image thirty-five). No associated hemorrhage or mass effect. No other evidence for acute or interval infarction. Gray-white matter differentiation otherwise maintained. No susceptibility artifact to suggest acute or chronic intracranial hemorrhage. Underlying atrophy with moderate chronic microvascular ischemic disease again noted. No mass lesion, mass effect, or midline shift. No hydrocephalus or extra-axial fluid collection. Pituitary gland and suprasellar region normal. Midline structures intact. Vascular: Major intracranial vascular flow voids are maintained. Skull and upper cervical spine: Craniocervical junction within normal limits. Postsurgical changes partially visualize within the upper cervical spine. Bone marrow signal intensity normal. No scalp soft tissue abnormality. Sinuses/Orbits: Subtle flattening with T2 hypointense thickening noted at the posterolateral aspects of the globes bilaterally, right slightly greater than left (series 10, image 8), of uncertain significance. Globes and orbital soft tissues otherwise unremarkable. Scattered mucosal thickening noted within the frontoethmoidal sinuses. Paranasal sinuses are otherwise clear. No significant mastoid effusion. Inner ear structures grossly normal. Other: None. MRA HEAD FINDINGS ANTERIOR CIRCULATION: Distal cervical segments of the internal carotid arteries are widely patent with antegrade flow. Mild atheromatous irregularity throughout the carotid siphons without  hemodynamically significant stenosis or other abnormality. A1 segments, anterior communicating artery complex, and anterior cerebral arteries widely patent and within normal limits. No M1 stenosis or occlusion. Distal MCA branches well perfused and symmetric. POSTERIOR CIRCULATION: Vertebral arteries widely patent to the vertebrobasilar junction. Left vertebral artery strongly dominant, with a diffusely hypoplastic right vertebral artery. Both picas patent. Basilar widely patent to its distal aspect. Superior cerebral arteries patent bilaterally. Right PCA supplied via the basilar remains widely patent. Interval occlusion of the left PCA since previous exam. Some attenuated flow is seen within the left posterior communicating artery. No intracranial aneurysm. MRA NECK FINDINGS AORTIC ARCH: Visualized aortic arch of normal caliber. Bovine branching pattern with common origin of the innominate and left common carotid arteries noted. No hemodynamically significant stenosis seen about the origin of the great vessels. RIGHT CAROTID SYSTEM: Right CCA widely patent from its origin to the bifurcation without stenosis. No significant atheromatous narrowing about the right bifurcation. Right ICA widely patent to the skull base without stenosis or other abnormality. LEFT CAROTID SYSTEM: Left CCA widely patent from its origin to the bifurcation without stenosis. No  more than mild short-segment 20% stenosis at the origin of the left ICA. Left ICA otherwise widely patent distally without stenosis or other vascular abnormality. VERTEBRAL ARTERIES: Both vertebral arteries arise from subclavian arteries. No proximal subclavian artery stenosis. Left vertebral artery strongly dominant, with a diffusely hypoplastic right vertebral artery. Both vertebral arteries widely patent within the neck without stenosis or occlusion. IMPRESSION: MRI HEAD IMPRESSION: 1. 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 No associated mass effect. 2. Underlying atrophy with moderate chronic microvascular ischemic disease. 3. Subtle flattening with T2 hypointense thickening at the posterolateral aspects of the globes bilaterally, of uncertain significance. Correlation with history and funduscopic exam suggested. MRA HEAD IMPRESSION: 1. Interval occlusion of the left PCA since previous exam. 2. Otherwise stable intracranial MRA. No other large vessel occlusion. No other hemodynamically significant or correctable stenosis. MRA NECK IMPRESSION: 1. No more than mild short-segment stenosis at the origin of the left ICA. Otherwise wide patency of both carotid artery systems within the neck. 2. Both vertebral arteries widely patent within the neck. Left vertebral artery strongly dominant, with a diffusely hypoplastic right vertebral artery. Electronically Signed   By: Jeannine Boga M.D.   On: 11/15/2019 20:13   MR BRAIN WO CONTRAST  Result Date: 11/14/2019 CLINICAL DATA:  Right facial numbness, blurry vision EXAM: MRI HEAD WITHOUT CONTRAST MRA HEAD WITHOUT CONTRAST TECHNIQUE: Multiplanar, multiecho pulse sequences of the brain and surrounding structures were obtained without intravenous contrast. Angiographic images of the head were obtained using MRA technique without contrast. COMPARISON:  None. FINDINGS: MRI HEAD Brain: There is no acute infarction or intracranial hemorrhage. There is no intracranial mass, mass effect, or edema. There is no hydrocephalus or extra-axial fluid collection. Ventricles and sulci are within normal limits in size and configuration. Patchy foci of T2 hyperintensity in the supratentorial white matter are nonspecific but may reflect mild to moderate chronic microvascular ischemic changes in a patient of this age. 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  Vascular: Major vessel flow voids at the skull base are preserved. Skull and upper cervical spine: Normal marrow signal is preserved. Sinuses/Orbits: Paranasal sinuses are aerated. Orbits are unremarkable. Other: Sella is unremarkable.  Mastoid air cells are clear. MRA HEAD Degraded by artifact. Intracranial internal carotid arteries are patent. Middle and anterior cerebral arteries are patent. Visual intracranial vertebral arteries are patent. The basilar artery is patent. The posterior cerebral arteries are patent. There is fetal origin of the left PCA. 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 There is no aneurysm. IMPRESSION: No acute infarction, hemorrhage, or mass. Moderate chronic microvascular ischemic changes. 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 Electronically Signed   By: Macy Mis M.D.   On: 11/14/2019 13:38   ECHOCARDIOGRAM COMPLETE  Result Date: 11/16/2019    ECHOCARDIOGRAM REPORT   Patient Name:   AKSH Yeldell Date of Exam: 11/16/2019 Medical Rec #:  [id number]    Height:       70.0 in Accession #:    [id number]   Weight:       180.0 lb Date of Birth:  [birth year]    BSA:          1.996 m Patient Age:    9 years     BP:           155/82 mmHg Patient Gender:  M            HR:           67 bpm. Exam Location:  [facility] Procedure: 2D Echo Indications:    Stroke 434.91 / I163.9  History:        Patient has no prior history of Echocardiogram examinations.                 Stroke; Risk Factors:Former Smoker. CMT (Charcot-Marie-Tooth                 disease, GERD.  Sonographer:    Leavy Cella RDCS (AE) Referring Phys: [id number] OLADAPO ADEFESO IMPRESSIONS  1. Left ventricular ejection fraction, by estimation, is 60 to 65%. The left ventricle has normal function. The left ventricle has no regional wall motion abnormalities. There is mild left ventricular hypertrophy. Left ventricular diastolic parameters are consistent with Grade I diastolic dysfunction (impaired  relaxation).  2. Right ventricular systolic function is normal. The right ventricular size is normal.  3. The mitral valve is normal in structure. No evidence of mitral valve regurgitation. No evidence of mitral stenosis.  4. The aortic valve is tricuspid. Aortic valve regurgitation is not visualized. No aortic stenosis is present.  5. The inferior vena cava is normal in size with greater than 50% respiratory variability, suggesting right atrial pressure of 3 mmHg. FINDINGS  Left Ventricle: Left ventricular ejection fraction, by estimation, is 60 to 65%. The left ventricle has normal function. The left ventricle has no regional wall motion abnormalities. The left ventricular internal cavity size was normal in size. There is  mild left ventricular hypertrophy. Left ventricular diastolic parameters are consistent with Grade I diastolic dysfunction (impaired relaxation). Normal left ventricular filling pressure. Right Ventricle: The right ventricular size is normal. No increase in right ventricular wall thickness. Right ventricular systolic function is normal. Left Atrium: Left atrial size was normal in size. Right Atrium: Right atrial size was normal in size. Pericardium: There is no evidence of pericardial effusion. Mitral Valve: The mitral valve is normal in structure. No evidence of mitral valve regurgitation. No evidence of mitral valve stenosis. Tricuspid Valve: The tricuspid valve is normal in structure. Tricuspid valve regurgitation is trivial. No evidence of tricuspid stenosis. Aortic Valve: The aortic valve is tricuspid. Aortic valve regurgitation is not visualized. No aortic stenosis is present. Aortic valve mean gradient measures 3.2 mmHg. Aortic valve peak gradient measures 5.4 mmHg. Aortic valve area, by VTI measures 3.93 cm. Pulmonic Valve: The pulmonic valve was not well visualized. Pulmonic valve regurgitation is not visualized. No evidence of pulmonic stenosis. Aorta: The aortic root is normal in  size and structure. Venous: The inferior vena cava is normal in size with greater than 50% respiratory variability, suggesting right atrial pressure of 3 mmHg. IAS/Shunts: The interatrial septum was not well visualized.  LEFT VENTRICLE PLAX 2D LVIDd:         4.39 cm  Diastology LVIDs:         2.15 cm  LV e' medial:    5.77 cm/s LV PW:         1.13 cm  LV E/e' medial:  11.2 LV IVS:        1.25 cm  LV e' lateral:   8.49 cm/s LVOT diam:     2.00 cm  LV E/e' lateral: 7.6 LV SV:         91 LV SV Index:   45 LVOT Area:     3.14 cm  RIGHT VENTRICLE RV S prime:     16.90 cm/s TAPSE (M-mode): 1.9 cm LEFT ATRIUM             Index       RIGHT ATRIUM           Index LA diam:        3.70 cm 1.85 cm/m  RA Area:     10.20 cm LA Vol (A2C):   41.7 ml 20.89 ml/m RA Volume:   23.60 ml  11.82 ml/m LA Vol (A4C):   36.6 ml 18.34 ml/m LA Biplane Vol: 39.6 ml 19.84 ml/m  AORTIC VALVE AV Area (Vmax):    3.53 cm AV Area (Vmean):   3.33 cm AV Area (VTI):     3.93 cm AV Vmax:           116.35 cm/s AV Vmean:          84.084 cm/s AV VTI:            0.231 m AV Peak Grad:      5.4 mmHg AV Mean Grad:      3.2 mmHg LVOT Vmax:         130.73 cm/s LVOT Vmean:        89.136 cm/s LVOT VTI:          0.289 m LVOT/AV VTI ratio: 1.25  AORTA Ao Root diam: 3.10 cm MITRAL VALVE MV Area (PHT): 2.46 cm     SHUNTS MV Decel Time: 309 msec     Systemic VTI:  0.29 m MV E velocity: 64.50 cm/s   Systemic Diam: 2.00 cm MV A velocity: 133.00 cm/s MV E/A ratio:  0.48 Carlyle Dolly MD Electronically signed by Carlyle Dolly MD Signature Date/Time: 11/16/2019/10:52:08 AM    Final      Microbiology: Recent Results (from the past 240 hour(s))  Respiratory Panel by RT PCR (Flu A&B, Covid) - Nasopharyngeal Swab     Status: None   Collection Time: 11/15/19  9:55 PM   Specimen: Nasopharyngeal Swab  Result Value Ref Range Status   SARS Coronavirus 2 by RT PCR NEGATIVE NEGATIVE Final    Comment: (NOTE) SARS-CoV-2 target nucleic acids are NOT DETECTED.   The SARS-CoV-2 RNA is generally detectable in upper respiratoy specimens during the acute phase of infection. The lowest concentration of SARS-CoV-2 viral copies this assay can detect is 131 copies/mL. A negative result does not preclude SARS-Cov-2 infection and should not be used as the sole basis for treatment or other patient management decisions. A negative result may occur with  improper specimen collection/handling, submission of specimen other than nasopharyngeal swab, presence of viral mutation(s) within the areas targeted by this assay, and inadequate number of viral copies (<131 copies/mL). A negative result must be combined with clinical observations, patient history, and epidemiological information. The expected result is Negative.  Fact Sheet for Patients:  PinkCheek.be  Fact Sheet for Healthcare Providers:  GravelBags.it  This test is no t yet approved or cleared by the [place] FDA and  has been authorized for detection and/or diagnosis of SARS-CoV-2 by FDA under an Emergency Use Authorization (EUA). This EUA will remain  in effect (meaning this test can be used) for the duration of the COVID-19 declaration under Section 564(b)(1) of the Act, 21 U.S.C. section 360bbb-3(b)(1), unless the authorization is terminated or revoked sooner.     Influenza A by PCR NEGATIVE NEGATIVE Final   Influenza B by PCR NEGATIVE NEGATIVE Final    Comment: (  NOTE) The Xpert Xpress SARS-CoV-2/FLU/RSV assay is intended as an aid in  the diagnosis of influenza from Nasopharyngeal swab specimens and  should not be used as a sole basis for treatment. Nasal washings and  aspirates are unacceptable for Xpert Xpress SARS-CoV-2/FLU/RSV  testing.  Fact Sheet for Patients: PinkCheek.be  Fact Sheet for Healthcare Providers: GravelBags.it  This test is not yet approved or  cleared by the [place] FDA and  has been authorized for detection and/or diagnosis of SARS-CoV-2 by  FDA under an Emergency Use Authorization (EUA). This EUA will remain  in effect (meaning this test can be used) for the duration of the  Covid-19 declaration under Section 564(b)(1) of the Act, 21  U.S.C. section 360bbb-3(b)(1), unless the authorization is  terminated or revoked. Performed at [facility], [address], [place], [place] [address]      Labs: Basic Metabolic Panel: Recent Labs  Lab 11/14/19 1008 11/14/19 1008 11/15/19 1741 11/16/19 0449  NA 137  --  137 139  K 3.9   < > 3.4* 4.2  CL 104  --  103 106  CO2 26  --  23 24  GLUCOSE 93  --  102* 102*  BUN 13  --  13 14  CREATININE 0.88  --  0.93 0.92  CALCIUM 9.2  --  9.2 9.2  MG  --   --   --  2.0  PHOS  --   --   --  3.4   < > = values in this interval not displayed.   Liver Function Tests: Recent Labs  Lab 11/14/19 1008 11/16/19 0449  AST 24 24  ALT 24 22  ALKPHOS 77 76  BILITOT 0.9 1.2  PROT 7.1 7.2  ALBUMIN 4.2 4.1   No results for input(s): LIPASE, AMYLASE in the last 168 hours. No results for input(s): AMMONIA in the last 168 hours. CBC: Recent Labs  Lab 11/14/19 1008 11/15/19 1741 11/16/19 0449  WBC 4.9 6.3 7.5  NEUTROABS 2.9  --   --   HGB 14.8 15.2 15.4  HCT 42.6 44.0 44.1  MCV 85.9 85.9 85.5  PLT 121* 126* 139*   Cardiac Enzymes: No results for input(s): CKTOTAL, CKMB, CKMBINDEX, TROPONINI in the last 168 hours. BNP: Invalid input(s): POCBNP CBG: No results for input(s): GLUCAP in the last 168 hours.  Time coordinating discharge:  36 minutes  Signed:  Orson Eva, DO [facility] Pager: [phone] 11/17/2019, 8:52 AM

## 2019-11-17 NOTE — Progress Notes (Addendum)
Inpatient Rehabilitation Medication Review by a Pharmacist  A complete drug regimen review was completed for this patient to identify any potential clinically significant medication issues.  Clinically significant medication issues were identified: Yes   Type of Medication Issue Identified Description of Issue Urgent (address now) Non-Urgent (address on AM team rounds) Plan Plan Accepted by Provider? (Yes / No / Pending AM Rounds)  Drug Interaction(s) (clinically significant)       Duplicate Therapy       Allergy       No Medication Administration End Date       Incorrect Dose       Additional Drug Therapy Needed       Other  Amlodipine was listed on [facility] discharge med list (per discharging provider, planned to start amlodipine at discharge; pt was on amlodipine PTA to [facility]); amlodipine was not ordered on admission to CIR Non urgent CIR team to review on AM rounds Pending AM rounds    Name of provider notified for urgent issues identified:  N/A  For non-urgent medication issues to be resolved on team rounds tomorrow morning a [facility] Secure Chat Handoff was sent to: N/A (rehab med rec completed after 5 PM)  Time spent performing this drug regimen review (minutes):  [place], PharmD, BCPS, [facility] Clinical Pharmacist 11/17/2019 7:18 PM

## 2019-11-17 NOTE — Progress Notes (Signed)
Pt arrived to unit via EMS, pt alert, able to make needs known, pt has right inattn with decreased sensation to right upper and lower extremities. 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 Nurse from [facility] reports that pt had fall 11/17/19 @ 0330 without injury. All needs met, call bell in reach.

## 2019-11-17 NOTE — Progress Notes (Signed)
Jamse Arn, MD  Physician  Physical Medicine and Rehabilitation  PMR Pre-admission     Signed  Date of Service:  11/16/2019  3:20 PM      Related encounter: ED to Hosp-Admission (Discharged) from 11/15/2019 in [place]       Show:Clear all [x] Manual[x] Template[x] Copied  Added by: [x] Cristina Gong, RN[x] Jamse Arn, MD  [] Hover for details PMR Admission Coordinator Pre-Admission Assessment   Patient: Philip Stone is an 69 y.o., male MRN: [id number] DOB: 1950/08/03 Height: 5\' 10"  (177.8 cm) Weight: 82 kg   Insurance Information   PRIMARY: [employer]      Policy#: [id number]      Subscriber: pt CM Name: Philip Stone   Phone#: [phone]     Fax#: full Epic access Pre-Cert#: [id number] for 7 days     Employer:  Benefits:  Phone #: [phone]     Name: 10/7 Eff. Date: 12/11/2014     Deduct: none      Out of Pocket Max: $2100      Life Max: none CIR: $250 co pay day 1, $125 co pay per day days 2 though 6      SNF: no copay days 1 until 20; $160 copay per day days 21 until 100 Outpatient: $10 per visit     Co-Pay: per medical neccesity Home Health: 80%      Co-Pay: 20% DME: 80%     Co-Pay: 20% Providers: in network  SECONDARY: none   Financial Counselor:       Phone#:    The Engineer, petroleum" for patients in Inpatient Rehabilitation Facilities with attached "Privacy Act [place] Records" was provided and verbally reviewed with: Patient and Family   Emergency Contact Information         Contact Information     Name Relation Home Work Mobile    [place], [place]     [phone]    [name], [name] [phone]             Current Medical History  Patient Admitting Diagnosis: CVA   History of Present Illness:     69 year old right-handed male with history significant for Charcot-Marie-Tooth disease, right foot drop with neuropathy, cervical stenosis with decompression and  discectomy 10 years ago, right TKA 01/21/2018, quit smoking 13 years ago.  [facility]ed [facility]/15/[facility] to [facility] with right side weakness and numbness x3 days as well as aphasia.  Initial MRI/MRA showed no acute infarction hemorrhage or mass.  Moderate chronic microvascular ischemic changes.  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  No associated mass-effect.  MRA of the head interval occlusion of left PCA since previous exam.  No other large vessel occlusion.  MRA of the neck no more than mild short segment stenosis of the origin of the left ICA.  Both vertebral arteries widely patent within the neck.  Patient did not receive TPA.  Echocardiogram with ejection fraction of 60 to 65% no wall motion abnormalities grade 1 diastolic dysfunction.  Admission chemistries were unremarkable, hemoglobin 15.2, platelets 126,000.  Neurology follow-up Dr.[place] and maintained on Plavix for CVA prophylaxis.  Subcutaneous Lovenox for DVT prophylaxis.  Tolerating a regular diet.  Noted on 11/17/2019 patient found sitting on the floor in front of  his bed with soiled linens no apparent injury except for abrasion left knee     Complete NIHSS TOTAL: 15   Patient's medical record from [facility]  has been reviewed by the rehabilitation admission coordinator and physician.   Past Medical History      Past Medical History:  Diagnosis Date  . Arthritis    . BPH (benign prostatic hyperplasia)    . Charcot-Marie-Tooth disease    . Foot drop, right    . Neuropathy    . Platelets decreased ([place])    . Spinal stenosis        Family History   family history includes Other in his father; Suicidality in his mother.   Prior Rehab/Hospitalizations Has the patient had prior rehab or  hospitalizations prior to admission? Yes   Has the patient had major surgery during 100 days prior to admission? No               Current Medications   Current Facility-Administered Medications:  .  atorvastatin (LIPITOR) tablet 40 mg, 40 mg, Oral, Daily, Tat, David, MD, 40 mg at 11/17/19 0811 .  clopidogrel (PLAVIX) tablet 75 mg, 75 mg, Oral, Daily, Tat, David, MD, 75 mg at 11/17/19 0814 .  enoxaparin (LOVENOX) injection 40 mg, 40 mg, Subcutaneous, Q24H, Tat, David, MD, 40 mg at 11/17/19 4818 .  hydrALAZINE (APRESOLINE) injection 10 mg, 10 mg, Intravenous, Q6H PRN, Tat, David, MD .  pantoprazole (PROTONIX) EC tablet 40 mg, 40 mg, Oral, QAC breakfast, Tat, David, MD, 40 mg at 11/17/19 5631   Patients Current Diet:     Diet Order                      Diet Heart Room service appropriate? Yes; Fluid consistency: Thin  Diet effective now                      Precautions / Restrictions Precautions Precautions: Fall Other Brace: bilateral AFO's in shoes Restrictions Weight Bearing Restrictions: No    Has the patient had 2 or more falls or a fall with injury in the past year? No Fall in acute hospital early am of 10/8 without injury   Prior Activity Level Community (5-7x/wk): Mod I with BLE AFOS, drove   Prior Functional Level Self Care: Did the patient need help bathing, dressing, using the toilet or eating? Independent   Indoor Mobility: Did the patient need assistance with walking from room to room (with or without device)? Independent   Stairs: Did the patient need assistance with internal or external stairs (with or without device)? Independent   Functional Cognition: Did the patient need help planning regular tasks such as shopping or remembering to take medications? Independent   Home Assistive Devices / Equipment Home Assistive Devices/Equipment: Brace (specify type) (Bilateral Lower leg braces) Home Equipment: Walker - 2 wheels, Cane - single point, Bedside commode,  Shower seat   Prior Device Use: Indicate devices/aids used by the patient prior to current illness, exacerbation or injury? BLE AFOs   Current Functional Level Cognition   Arousal/Alertness: Awake/alert Overall Cognitive Status: Impaired/Different from baseline Orientation Level: Oriented X4 Memory: Impaired Memory Impairment: Decreased short term memory Awareness: Appears intact Problem Solving: Impaired Problem Solving Impairment: Verbal complex Executive Function: Self Monitoring, Self Correcting Self Monitoring: Impaired Self Monitoring Impairment: Verbal basic Self Correcting: Impaired Self Correcting Impairment: Verbal basic Safety/Judgment: Appears intact    Extremity Assessment (includes Sensation/Coordination)   Upper  Extremity Assessment: Defer to OT evaluation  Lower Extremity Assessment: RLE deficits/detail, LLE deficits/detail RLE Deficits / Details: grossly -3/5 except ankle dorsiflexors 1/5 RLE Sensation: decreased light touch, decreased proprioception, history of peripheral neuropathy RLE Coordination: decreased fine motor LLE Deficits / Details: grossly 4+/5, except ankle dorsiflexors -2/5 LLE Sensation: WNL LLE Coordination: decreased fine motor     ADLs         Mobility   Overal bed mobility: Needs Assistance Bed Mobility: Supine to Sit, Sit to Supine Supine to sit: Min guard Sit to supine: Min guard General bed mobility comments: increased time, labored movement     Transfers   Overall transfer level: Needs assistance Equipment used: Rolling walker (2 wheeled), None Transfers: Sit to/from Stand, [employer] Transfers Sit to Stand: Min assist Stand pivot transfers: Min assist, Mod assist General transfer comment: neglict of right side, very unsteady with poor right handed grip strength to hold onto RW     Ambulation / Gait / Stairs / Wheelchair Mobility   Ambulation/Gait Ambulation/Gait assistance: Mod assist Gait Distance (Feet): 30  Feet Assistive device: Rolling walker (2 wheeled) Gait Pattern/deviations: Step-to pattern, Decreased step length - right, Decreased step length - left, Decreased stance time - right, Decreased dorsiflexion - right, Decreased dorsiflexion - left, Ataxic General Gait Details: slow labored cadence with diffiuclty advancing RLE due to weakness, decreased proprioception, ataxic like movement, limited secondary to c/o fatigue Gait velocity: decreased     Posture / Balance Dynamic Sitting Balance Sitting balance - Comments: fair/good seated at EOB Balance Overall balance assessment: Needs assistance Sitting-balance support: No upper extremity supported, Feet supported Sitting balance-Leahy Scale: Fair Sitting balance - Comments: fair/good seated at EOB Standing balance support: No upper extremity supported, During functional activity Standing balance-Leahy Scale: Poor Standing balance comment: fair/poor using RW mostly due to right hand slipping off RW     Special needs/care consideration Visitor , girlfriend, Carleene Cooper from bed acute hospital at [facility] early am 10/8    Previous Home Environment  Living Arrangements: Alone  Lives With: Alone Available Help at Discharge:  (girlfriend can assist) Type of Home: House Home Layout: One level Home Access: Stairs to enter Entrance Stairs-Rails: None Technical brewer of Steps: 2 Bathroom Shower/Tub: Multimedia programmer: Programmer, systems: Yes [place]: No   Discharge Living Setting Plans for Discharge Living Setting: Patient's home, Alone Type of Home at Discharge: House Discharge [place]: One level Discharge Home Access: Stairs to enter Entrance Stairs-Rails: None Entrance Stairs-Number of Steps: 2 Discharge Bathroom Shower/Tub: Walk-in shower Discharge Bathroom Toilet: Standard Discharge [place] Accessibility: Yes How Accessible: Accessible via walker Does the patient have any problems  obtaining your medications?: No   Social/Family/Support Systems Contact Information: girlfriend, [name] Anticipated Caregiver: [name] Anticipated Caregiver's Contact Information: see above Ability/Limitations of Caregiver: [name] does not work Careers adviser: 24/7 Discharge Plan Discussed with Primary Caregiver: Yes Is Caregiver In Agreement with Plan?: Yes Does Caregiver/Family have Issues with Lodging/Transportation while Pt is in Rehab?: No   Goals Patient/Family Goal for Rehab: supervision to min with PT and OT, supervision SLP Expected length of stay: ELOS 2 to 3 weeks Pt/Family Agrees to Admission and willing to participate: Yes Program Orientation Provided & Reviewed with Pt/Caregiver Including Roles  & Responsibilities: Yes   Decrease burden of Care through IP rehab admission: n/a   Possible need for SNF placement upon discharge: not anticipated   Patient Condition: I have reviewed medical records from [facility] ,  spoken with CM, and patient and family member. I discussed via phone for inpatient rehabilitation assessment.  Patient will benefit from ongoing PT, OT and SLP, can actively participate in 3 hours of therapy a day 5 days of the week, and can make measurable gains during the admission.  Patient will also benefit from the coordinated team approach during an Inpatient Acute Rehabilitation admission.  The patient will receive intensive therapy as well as Rehabilitation physician, nursing, social worker, and care management interventions.  Due to bladder management, bowel management, safety, skin/wound care, disease management, medication administration, pain management and patient education the patient requires 24 hour a day rehabilitation nursing.  The patient is currently mod assist overall with mobility and basic ADLs.  Discharge setting and therapy post discharge at home with home health is anticipated.  Patient has agreed to participate in the Acute Inpatient  Rehabilitation Program and will admit today.   Preadmission Screen Completed By:  Cleatrice Burke, 11/17/2019 10:13 AM ______________________________________________________________________   Discussed status with Dr. Posey Pronto  on  11/17/2019 at  1014 and received approval for admission today.   Admission Coordinator:  Cleatrice Burke, RN, time 8299 Date  11/17/2019    Assessment/Plan: Diagnosis: Left PCA infarct 1. Does the need for close, 24 hr/day Medical supervision in concert with the patient's rehab needs make it unreasonable for this patient to be served in a less intensive setting? Yes  2. 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 Due to safety, disease management, medication administration and patient education, does the patient require 24 hr/day rehab nursing? Yes 4. Does the patient require coordinated care of a physician, rehab nurse, PT, OT, and SLP to address physical and functional deficits in the context of the above medical diagnosis(es)? Yes Addressing deficits in the following areas: balance, endurance, locomotion, strength, transferring, bowel/bladder control, bathing, toileting, speech and psychosocial support 5. Can the patient actively participate in an intensive therapy program of at least 3 hrs of therapy 5 days a week? Yes 6. The potential for patient to make measurable gains while on inpatient rehab is excellent 7. Anticipated functional outcomes upon discharge from inpatient rehab: supervision and min assist PT, supervision and min assist OT, modified independent and supervision SLP 8. Estimated rehab length of stay to reach the above functional goals is: 10-14 days. 9. Anticipated discharge destination: Home 10. Overall Rehab/Functional Prognosis: good     MD Signature: Delice Lesch,  MD, ABPMR        Revision History                                    Note Details  Author Jamse Arn, MD File Time 11/17/2019 10:32 AM  Author Type Physician Status Signed  Last Editor Jamse Arn, MD Service Physical Medicine and [place] # [id number] Admit Date 11/17/2019

## 2019-11-17 NOTE — H&P (Signed)
Physical Medicine and Rehabilitation Admission H&P    Chief Complaint  Patient presents with  . Numbness  : HPI: Philip Stone is a 69 year old right-handed male with history significant for Charcot-Marie-Tooth disease, right foot drop with neuropathy, cervical stenosis with decompression and discectomy 10 years ago, right TKA 01/21/2018, quit smoking 13 years ago.  History taken from chart review due to aphasia.  Patient lives alone 1 level home 2 steps to entry independent with assistive device and still driving.  He does have a girlfriend with good support.  He presented on 11/15/2019 to [facility] with right side hemiparesis and numbness x3 days as well as aphasia.  Initial MRI/MRA showed no acute infarction hemorrhage or mass.  Moderate chronic microvascular ischemic changes.  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  No associated mass-effect.  MRA of the head interval occlusion of left PCA since previous exam.  No other large vessel occlusion.  MRA of the neck no more than mild short segment stenosis of the origin of the left ICA.  Both vertebral arteries widely patent within the neck.  Patient did not receive TPA.  Echocardiogram with ejection fraction of 60 to 65%, no wall motion abnormalities.  Grade 1 diastolic dysfunction. Admission chemistries were unremarkable, hemoglobin 15.2, platelets 126.  Neurology follow-up Dr.[place]oon[place] and maintained on Plavix for CVA prophylaxis.  Subcutaneous Lovenox for DVT prophylaxis.  Tolerating a regular diet.  Noted on 11/17/2019 patient found sitting on the floor in front of his bed with soiled linens no apparent injury except for abrasion left knee.  Therapy evaluations completed and patient was admitted for a comprehensive rehab program.   Please see preadmission assessment from earlier today as well.  Review of Systems  Unable to perform ROS: Mental acuity   Past Medical History:  Diagnosis Date  . Arthritis   . BPH (benign prostatic hyperplasia)   . Charcot-Marie-Tooth disease   . Foot drop, right   . Neuropathy   . Platelets decreased ([place])   . Spinal stenosis    Past Surgical History:  Procedure Laterality Date  . CERVICAL DISC SURGERY  10 YEARS AGO   reports there are plates and screws in place   . FOOT ARTHROPLASTY    . INGUINAL HERNIA REPAIR Right 06/15/2014   Procedure: RIGHT INGUINAL HERNIORRHAPHY  WITH MESH;  Surgeon: Aviva Signs Md, MD;  Location: [facility];  Service: General;  Laterality: Right;  . INSERTION OF MESH Right 06/15/2014   Procedure: INSERTION OF MESH RIGHT INGUINAL HERNIORRHAPHY;  Surgeon: Aviva Signs Md, MD;  Location: [facility];  Service: General;  Laterality: Right;  . KNEE ARTHROSCOPY Right   . TOTAL KNEE ARTHROPLASTY Right 01/21/2018   Procedure: TOTAL KNEE ARTHROPLASTY;  Surgeon: Sydnee Cabal, MD;  Location: [facility];  Service: Orthopedics;  Laterality: Right;   Family History  Problem Relation Age of Onset  . Suicidality Mother   . Other Father        "old age"   Social History:  reports that he quit smoking about 13 years ago. His smoking use included cigarettes. He started smoking about 30 years ago. He has a 18.00 pack-year smoking history. He has never used smokeless tobacco. He reports that he does not drink alcohol and does not use drugs. Allergies:  Allergies  Allergen  Reactions  . Asa [Aspirin] Anaphylaxis  . Prednisone     Makes him lightheaded   Medications Prior to Admission  Medication Sig Dispense Refill  . pantoprazole (PROTONIX) 40 MG tablet Take 1 tablet (40 mg total) by mouth daily. 90 tablet 1  . tamsulosin (FLOMAX) 0.4 MG CAPS capsule TAKE TWO CAPSULES BY MOUTH DAILY AT BEDTIME (Patient taking differently: Take 0.8 mg by mouth at bedtime. ) 180 capsule 0     Drug Regimen Review Drug regimen was reviewed and remains appropriate with no significant issues identified  Home: Home Living Family/patient expects to be discharged to:: Private residence Living Arrangements: Alone Available Help at Discharge:  (girlfriend can assist) Type of Home: House Home Access: Stairs to enter [employer] of Steps: 2 Entrance Stairs-Rails: None Home Layout: One level Bathroom Shower/Tub: Multimedia programmer: Standard Bathroom Accessibility: Yes Home Equipment: Environmental consultant - 2 wheels, Cane - single point, Bedside commode, Shower seat  Lives With: Alone   Functional History: Prior Function Level of Independence: Independent with assistive device(s) Comments: Hydrographic surveyor, drives, wears bilateral ankle braces  Functional Status:  Mobility: Bed Mobility Overal bed mobility: Needs Assistance Bed Mobility: Supine to Sit, Sit to Supine Supine to sit: Min guard Sit to supine: Min guard General bed mobility comments: increased time, labored movement Transfers Overall transfer level: Needs assistance Equipment used: Rolling walker (2 wheeled), None Transfers: Sit to/from Stand, [employer] Transfers Sit to Stand: Min assist Stand pivot transfers: Min assist, Mod assist General transfer comment: neglict of right side, very unsteady with poor right handed grip strength to hold onto RW Ambulation/Gait Ambulation/Gait assistance: Mod assist Gait Distance (Feet): 30 Feet Assistive device: Rolling walker (2 wheeled) Gait Pattern/deviations: Step-to pattern, Decreased step length - right, Decreased step length - left, Decreased stance time - right, Decreased dorsiflexion - right, Decreased dorsiflexion - left, Ataxic General Gait Details: slow labored cadence with diffiuclty advancing RLE due to weakness, decreased proprioception, ataxic like movement, limited secondary to c/o fatigue Gait velocity: decreased    ADL:    Cognition:  Cognition Overall Cognitive Status: Impaired/Different from baseline Arousal/Alertness: Awake/alert Orientation Level: Oriented X4 Memory: Impaired Memory Impairment: Decreased short term memory Awareness: Appears intact Problem Solving: Impaired Problem Solving Impairment: Verbal complex Executive Function: Self Monitoring, Self Correcting Self Monitoring: Impaired Self Monitoring Impairment: Verbal basic Self Correcting: Impaired Self Correcting Impairment: Verbal basic Safety/Judgment: Appears intact Cognition Arousal/Alertness: Awake/alert Behavior During Therapy: WFL for tasks assessed/performed Overall Cognitive Status: Impaired/Different from baseline  Physical Exam: Blood pressure (!) 167/93, pulse 85, temperature 98 F (36.7 C), temperature source Oral, resp. rate 20, height 5\' 10"  (1.778 m), weight 82 kg, SpO2 96 %. Physical Exam Vitals reviewed.  Constitutional:      General: He is not in acute distress.    Appearance: Normal appearance.  HENT:     Head: Normocephalic and atraumatic.     Right Ear: External ear normal.     Left Ear: External ear normal.     Nose: Nose normal.  Eyes:     General:        Right eye: No discharge.        Left eye: No discharge.     Extraocular Movements: Extraocular movements intact.  Cardiovascular:     Rate and Rhythm: Normal rate and regular rhythm.  Pulmonary:     Effort: Pulmonary effort is normal. No respiratory distress.     Breath sounds: Normal breath sounds. No stridor.  Abdominal:  General: Abdomen is flat. Bowel sounds are normal. There is no distension.  Musculoskeletal:     Cervical back: Normal range of motion and neck supple.     Comments: No edema or tenderness in extremities  Neurological:     Mental Status: He is alert.     Comments: Alert and oriented?  X1 Moderate expressive aphasia LUE: 5/5 proximal distal LLE: Hip flexion, knee extension 4-4+/5, ankle dorsiflexion 1/5 RUE: Appears to be 4/5  proximal to distal, limited due to apraxia RLE: Hip flexion, knee extension 2+/5, ankle dorsiflexion 0/5  Psychiatric:        Mood and Affect: Affect is blunt.        Speech: Speech is delayed.        Behavior: Behavior is slowed.     Results for orders placed or performed during the hospital encounter of 11/15/19 (from the past 48 hour(s))  CBC     Status: Abnormal   Collection Time: 11/15/19  5:41 PM  Result Value Ref Range   WBC 6.3 4.0 - 10.5 K/uL   RBC 5.12 4.22 - 5.81 MIL/uL   Hemoglobin 15.2 13.0 - 17.0 g/dL   HCT 44.0 39 - 52 %   MCV 85.9 80.0 - 100.0 fL   MCH 29.7 26.0 - 34.0 pg   MCHC 34.5 30.0 - 36.0 g/dL   RDW 13.2 11.5 - 15.5 %   Platelets 126 (L) 150 - 400 K/uL   nRBC 0.0 0.0 - 0.2 %    Comment: Performed at [facility], [address] [place], [place] [address]  Basic metabolic panel     Status: Abnormal   Collection Time: 11/15/19  5:41 PM  Result Value Ref Range   Sodium 137 135 - 145 mmol/L   Potassium 3.4 (L) 3.5 - 5.1 mmol/L   Chloride 103 98 - 111 mmol/L   CO2 23 22 - 32 mmol/L   Glucose, Bld 102 (H) 70 - 99 mg/dL    Comment: Glucose reference range applies only to samples taken after fasting for at least 8 hours.   BUN 13 8 - 23 mg/dL   Creatinine, Ser 0.93 0.61 - 1.24 mg/dL   Calcium 9.2 8.9 - 10.3 mg/dL   GFR calc non Af Amer >60 >60 mL/min   Anion gap 11 5 - 15    Comment: Performed at [facility], [address], [place], [place] [address]  Respiratory Panel by RT PCR (Flu A&B, Covid) - Nasopharyngeal Swab     Status: None   Collection Time: 11/15/19  9:55 PM   Specimen: Nasopharyngeal Swab  Result Value Ref Range   SARS Coronavirus 2 by RT PCR NEGATIVE NEGATIVE    Comment: (NOTE) SARS-CoV-2 target nucleic acids are NOT DETECTED.  The SARS-CoV-2 RNA is generally detectable in upper respiratoy specimens during the acute phase of infection. The lowest concentration of SARS-CoV-2 viral copies this assay can detect is 131 copies/mL. A negative  result does not preclude SARS-Cov-2 infection and should not be used as the sole basis for treatment or other patient management decisions. A negative result may occur with  improper specimen collection/handling, submission of specimen other than nasopharyngeal swab, presence of viral mutation(s) within the areas targeted by this assay, and inadequate number of viral copies (<131 copies/mL). A negative result must be combined with clinical observations, patient history, and epidemiological information. The expected result is Negative.  Fact Sheet for Patients:  PinkCheek.be  Fact Sheet for Healthcare Providers:  GravelBags.it  This test is no t yet approved or cleared by the [place] and  has been authorized for detection and/or diagnosis of SARS-CoV-2 by FDA under an Emergency Use Authorization (EUA). This EUA will remain  in effect (meaning this test can be used) for the duration of the COVID-19 declaration under Section 564(b)(1) of the Act, 21 U.S.C. section 360bbb-3(b)(1), unless the authorization is terminated or revoked sooner.     Influenza A by PCR NEGATIVE NEGATIVE   Influenza B by PCR NEGATIVE NEGATIVE    Comment: (NOTE) The Xpert Xpress SARS-CoV-2/FLU/RSV assay is intended as an aid in  the diagnosis of influenza from Nasopharyngeal swab specimens and  should not be used as a sole basis for treatment. Nasal washings and  aspirates are unacceptable for Xpert Xpress SARS-CoV-2/FLU/RSV  testing.  Fact Sheet for Patients: PinkCheek.be  Fact Sheet for Healthcare Providers: GravelBags.it  This test is not yet approved or cleared by the [place] FDA and  has been authorized for detection and/or diagnosis of SARS-CoV-2 by  FDA under an Emergency Use Authorization (EUA). This EUA will remain  in effect (meaning this test can be used) for the  duration of the  Covid-19 declaration under Section 564(b)(1) of the Act, 21  U.S.C. section 360bbb-3(b)(1), unless the authorization is  terminated or revoked. Performed at [facility], [address], [place], [place] [address]   Comprehensive metabolic panel     Status: Abnormal   Collection Time: 11/16/19  4:49 AM  Result Value Ref Range   Sodium 139 135 - 145 mmol/L   Potassium 4.2 3.5 - 5.1 mmol/L    Comment: DELTA CHECK NOTED   Chloride 106 98 - 111 mmol/L   CO2 24 22 - 32 mmol/L   Glucose, Bld 102 (H) 70 - 99 mg/dL    Comment: Glucose reference range applies only to samples taken after fasting for at least 8 hours.   BUN 14 8 - 23 mg/dL   Creatinine, Ser 0.92 0.61 - 1.24 mg/dL   Calcium 9.2 8.9 - 10.3 mg/dL   Total Protein 7.2 6.5 - 8.1 g/dL   Albumin 4.1 3.5 - 5.0 g/dL   AST 24 15 - 41 U/L   ALT 22 0 - 44 U/L   Alkaline Phosphatase 76 38 - 126 U/L   Total Bilirubin 1.2 0.3 - 1.2 mg/dL   GFR calc non Af Amer >60 >60 mL/min   Anion gap 9 5 - 15    Comment: Performed at [facility], [address], [place], [place] [address]  CBC     Status: Abnormal   Collection Time: 11/16/19  4:49 AM  Result Value Ref Range   WBC 7.5 4.0 - 10.5 K/uL   RBC 5.16 4.22 - 5.81 MIL/uL   Hemoglobin 15.4 13.0 - 17.0 g/dL   HCT 44.1 39 - 52 %   MCV 85.5 80.0 - 100.0 fL   MCH 29.8 26.0 - 34.0 pg   MCHC 34.9 30.0 - 36.0 g/dL   RDW 13.2 11.5 - 15.5 %   Platelets 139 (L) 150 - 400 K/uL   nRBC 0.0 0.0 - 0.2 %    Comment: Performed at [facility], [address], [place], [place] [address]  Protime-INR     Status: None   Collection Time: 11/16/19  4:49 AM  Result Value Ref Range   Prothrombin Time 14.7 11.4 - 15.2 seconds   INR 1.2 0.8 - 1.2    Comment: (NOTE) INR goal  varies based on device and disease states. Performed at [facility], [address] [place], [place] [address]   APTT     Status: None   Collection Time: 11/16/19  4:49 AM  Result Value Ref Range   aPTT 32 24 - 36  seconds    Comment: Performed at [facility], [address] [place], [place] [address]  Magnesium     Status: None   Collection Time: 11/16/19  4:49 AM  Result Value Ref Range   Magnesium 2.0 1.7 - 2.4 mg/dL    Comment: Performed at [facility], [address] [place], [place] [address]  Phosphorus     Status: None   Collection Time: 11/16/19  4:49 AM  Result Value Ref Range   Phosphorus 3.4 2.5 - 4.6 mg/dL    Comment: Performed at [facility], [address], [place], [place] [address]  Lipid panel     Status: Abnormal   Collection Time: 11/16/19  4:49 AM  Result Value Ref Range   Cholesterol 186 0 - 200 mg/dL   Triglycerides 44 <150 mg/dL   HDL 52 >40 mg/dL   Total CHOL/HDL Ratio 3.6 RATIO   VLDL 9 0 - 40 mg/dL   LDL Cholesterol 125 (H) 0 - 99 mg/dL    Comment:        Total Cholesterol/HDL:CHD Risk Coronary Heart Disease Risk Table                     Men   Women  1/2 Average Risk   3.4   3.3  Average Risk       5.0   4.4  2 X Average Risk   9.6   7.1  3 X Average Risk  23.4   11.0        Use the calculated Patient Ratio above and the CHD Risk Table to determine the patient's CHD Risk.        ATP III CLASSIFICATION (LDL):  <100     mg/dL   Optimal  100-129  mg/dL   Near or Above                    Optimal  130-159  mg/dL   Borderline  160-189  mg/dL   High  >190     mg/dL   Very High Performed at [facility] [place], [place] [address]   Hemoglobin A1c     Status: None   Collection Time: 11/16/19  4:49 AM  Result Value Ref Range   Hgb A1c MFr Bld 5.6 4.8 - 5.6 %    Comment: (NOTE) Pre diabetes:          5.7%-6.4%  Diabetes:              >6.4%  Glycemic control for   <7.0% adults with diabetes    Mean Plasma Glucose 114.02 mg/dL    Comment: Performed at [facility] [address], [place], [place] [address]  TSH     Status: None   Collection Time: 11/16/19  4:49 AM  Result Value Ref Range   TSH 1.635 0.350 - 4.500 uIU/mL     Comment: Performed by a 3rd Generation assay with a functional sensitivity of <=0.01 uIU/mL. Performed at [facility], [address] [place], [place] [address]   Vitamin B12     Status: None   Collection Time: 11/16/19  4:49 AM  Result Value Ref Range  Vitamin B-12 305 180 - 914 pg/mL    Comment: (NOTE) This assay is not validated for testing neonatal or myeloproliferative syndrome specimens for Vitamin B12 levels. Performed at [facility], [address], [place], [place] [address]   Folate     Status: None   Collection Time: 11/16/19  4:49 AM  Result Value Ref Range   Folate 32.4 >5.9 ng/mL    Comment: RESULTS CONFIRMED BY MANUAL DILUTION Performed at [facility], [address], [place], [place] [address]    MR ANGIO HEAD WO CONTRAST  Result Date: 11/15/2019 CLINICAL DATA:  Initial evaluation for acute neuro deficit, stroke suspected. EXAM: MRI HEAD WITHOUT CONTRAST MRA HEAD WITHOUT CONTRAST MRA NECK WITHOUT AND WITH CONTRAST TECHNIQUE: Multiplanar, multiecho pulse sequences of the brain and surrounding structures were obtained without intravenous contrast. Angiographic images of the Circle of Willis were obtained using MRA technique without intravenous contrast. Angiographic images of the neck were obtained using MRA technique without and with intravenous contrast. Carotid stenosis measurements (when applicable) are obtained utilizing NASCET criteria, using the distal internal carotid diameter as the denominator. CONTRAST:  41mL GADAVIST GADOBUTROL 1 MMOL/ML IV SOLN COMPARISON:  Recent MRI/MRA from 11/14/2019. FINDINGS: MRI HEAD FINDINGS Brain: There has been interval development of patchy restricted diffusion involving the parasagittal left temporal occipital region, compatible with an acute left PCA territory infarct, overall moderate in size. Involvement is greatest at the mesial left temporal lobe/left hippocampal formation. Mild patchy involvement of the left thalamus noted as well.  Additional punctate cortical infarct noted superiorly at the paramedian left parietal lobe (series 5, image thirty-five). No associated hemorrhage or mass effect. No other evidence for acute or interval infarction. Gray-white matter differentiation otherwise maintained. No susceptibility artifact to suggest acute or chronic intracranial hemorrhage. Underlying atrophy with moderate chronic microvascular ischemic disease again noted. No mass lesion, mass effect, or midline shift. No hydrocephalus or extra-axial fluid collection. Pituitary gland and suprasellar region normal. Midline structures intact. Vascular: Major intracranial vascular flow voids are maintained. Skull and upper cervical spine: Craniocervical junction within normal limits. Postsurgical changes partially visualize within the upper cervical spine. Bone marrow signal intensity normal. No scalp soft tissue abnormality. Sinuses/Orbits: Subtle flattening with T2 hypointense thickening noted at the posterolateral aspects of the globes bilaterally, right slightly greater than left (series 10, image 8), of uncertain significance. Globes and orbital soft tissues otherwise unremarkable. Scattered mucosal thickening noted within the frontoethmoidal sinuses. Paranasal sinuses are otherwise clear. No significant mastoid effusion. Inner ear structures grossly normal. Other: None. MRA HEAD FINDINGS ANTERIOR CIRCULATION: Distal cervical segments of the internal carotid arteries are widely patent with antegrade flow. Mild atheromatous irregularity throughout the carotid siphons without hemodynamically significant stenosis or other abnormality. A1 segments, anterior communicating artery complex, and anterior cerebral arteries widely patent and within normal limits. No M1 stenosis or occlusion. Distal MCA branches well perfused and symmetric. POSTERIOR CIRCULATION: Vertebral arteries widely patent to the vertebrobasilar junction. Left vertebral artery strongly  dominant, with a diffusely hypoplastic right vertebral artery. Both picas patent. Basilar widely patent to its distal aspect. Superior cerebral arteries patent bilaterally. Right PCA supplied via the basilar remains widely patent. Interval occlusion of the left PCA since previous exam. Some attenuated flow is seen within the left posterior communicating artery. No intracranial aneurysm. MRA NECK FINDINGS AORTIC ARCH: Visualized aortic arch of normal caliber. Bovine branching pattern with common origin of the innominate and left common carotid arteries noted. No hemodynamically significant stenosis seen about the origin of  the great vessels. RIGHT CAROTID SYSTEM: Right CCA widely patent from its origin to the bifurcation without stenosis. No significant atheromatous narrowing about the right bifurcation. Right ICA widely patent to the skull base without stenosis or other abnormality. LEFT CAROTID SYSTEM: Left CCA widely patent from its origin to the bifurcation without stenosis. No more than mild short-segment 20% stenosis at the origin of the left ICA. Left ICA otherwise widely patent distally without stenosis or other vascular abnormality. VERTEBRAL ARTERIES: Both vertebral arteries arise from subclavian arteries. No proximal subclavian artery stenosis. Left vertebral artery strongly dominant, with a diffusely hypoplastic right vertebral artery. Both vertebral arteries widely patent within the neck without stenosis or occlusion. IMPRESSION: MRI HEAD IMPRESSION: 1. 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 No associated mass effect. 2. Underlying atrophy with moderate chronic microvascular ischemic disease. 3. Subtle flattening with T2 hypointense thickening at the posterolateral aspects of the globes bilaterally, of uncertain significance. Correlation with history and  funduscopic exam suggested. MRA HEAD IMPRESSION: 1. Interval occlusion of the left PCA since previous exam. 2. Otherwise stable intracranial MRA. No other large vessel occlusion. No other hemodynamically significant or correctable stenosis. MRA NECK IMPRESSION: 1. No more than mild short-segment stenosis at the origin of the left ICA. Otherwise wide patency of both carotid artery systems within the neck. 2. Both vertebral arteries widely patent within the neck. Left vertebral artery strongly dominant, with a diffusely hypoplastic right vertebral artery. Electronically Signed   By: Jeannine Boga M.D.   On: 11/15/2019 20:13   MR ANGIO NECK W WO CONTRAST  Result Date: 11/15/2019 CLINICAL DATA:  Initial evaluation for acute neuro deficit, stroke suspected. EXAM: MRI HEAD WITHOUT CONTRAST MRA HEAD WITHOUT CONTRAST MRA NECK WITHOUT AND WITH CONTRAST TECHNIQUE: Multiplanar, multiecho pulse sequences of the brain and surrounding structures were obtained without intravenous contrast. Angiographic images of the Circle of Willis were obtained using MRA technique without intravenous contrast. Angiographic images of the neck were obtained using MRA technique without and with intravenous contrast. Carotid stenosis measurements (when applicable) are obtained utilizing NASCET criteria, using the distal internal carotid diameter as the denominator. CONTRAST:  20mL GADAVIST GADOBUTROL 1 MMOL/ML IV SOLN COMPARISON:  Recent MRI/MRA from 11/14/2019. FINDINGS: MRI HEAD FINDINGS Brain: There has been interval development of patchy restricted diffusion involving the parasagittal left temporal occipital region, compatible with an acute left PCA territory infarct, overall moderate in size. Involvement is greatest at the mesial left temporal lobe/left hippocampal formation. Mild patchy involvement of the left thalamus noted as well. Additional punctate cortical infarct noted superiorly at the paramedian left parietal lobe (series 5,  image thirty-five). No associated hemorrhage or mass effect. No other evidence for acute or interval infarction. Gray-white matter differentiation otherwise maintained. No susceptibility artifact to suggest acute or chronic intracranial hemorrhage. Underlying atrophy with moderate chronic microvascular ischemic disease again noted. No mass lesion, mass effect, or midline shift. No hydrocephalus or extra-axial fluid collection. Pituitary gland and suprasellar region normal. Midline structures intact. Vascular: Major intracranial vascular flow voids are maintained. Skull and upper cervical spine: Craniocervical junction within normal limits. Postsurgical changes partially visualize within the upper cervical spine. Bone marrow signal intensity normal. No scalp soft tissue abnormality. Sinuses/Orbits: Subtle flattening with T2 hypointense thickening noted at the posterolateral aspects of the globes bilaterally, right slightly greater than left (series 10, image 8), of uncertain significance. Globes and orbital soft tissues  otherwise unremarkable. Scattered mucosal thickening noted within the frontoethmoidal sinuses. Paranasal sinuses are otherwise clear. No significant mastoid effusion. Inner ear structures grossly normal. Other: None. MRA HEAD FINDINGS ANTERIOR CIRCULATION: Distal cervical segments of the internal carotid arteries are widely patent with antegrade flow. Mild atheromatous irregularity throughout the carotid siphons without hemodynamically significant stenosis or other abnormality. A1 segments, anterior communicating artery complex, and anterior cerebral arteries widely patent and within normal limits. No M1 stenosis or occlusion. Distal MCA branches well perfused and symmetric. POSTERIOR CIRCULATION: Vertebral arteries widely patent to the vertebrobasilar junction. Left vertebral artery strongly dominant, with a diffusely hypoplastic right vertebral artery. Both picas patent. Basilar widely patent to its  distal aspect. Superior cerebral arteries patent bilaterally. Right PCA supplied via the basilar remains widely patent. Interval occlusion of the left PCA since previous exam. Some attenuated flow is seen within the left posterior communicating artery. No intracranial aneurysm. MRA NECK FINDINGS AORTIC ARCH: Visualized aortic arch of normal caliber. Bovine branching pattern with common origin of the innominate and left common carotid arteries noted. No hemodynamically significant stenosis seen about the origin of the great vessels. RIGHT CAROTID SYSTEM: Right CCA widely patent from its origin to the bifurcation without stenosis. No significant atheromatous narrowing about the right bifurcation. Right ICA widely patent to the skull base without stenosis or other abnormality. LEFT CAROTID SYSTEM: Left CCA widely patent from its origin to the bifurcation without stenosis. No more than mild short-segment 20% stenosis at the origin of the left ICA. Left ICA otherwise widely patent distally without stenosis or other vascular abnormality. VERTEBRAL ARTERIES: Both vertebral arteries arise from subclavian arteries. No proximal subclavian artery stenosis. Left vertebral artery strongly dominant, with a diffusely hypoplastic right vertebral artery. Both vertebral arteries widely patent within the neck without stenosis or occlusion. IMPRESSION: MRI HEAD IMPRESSION: 1. 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 No associated mass effect. 2. Underlying atrophy with moderate chronic microvascular ischemic disease. 3. Subtle flattening with T2 hypointense thickening at the posterolateral aspects of the globes bilaterally, of uncertain significance. Correlation with history and funduscopic exam suggested. MRA HEAD IMPRESSION: 1. Interval occlusion of the left PCA since previous exam. 2.  Otherwise stable intracranial MRA. No other large vessel occlusion. No other hemodynamically significant or correctable stenosis. MRA NECK IMPRESSION: 1. No more than mild short-segment stenosis at the origin of the left ICA. Otherwise wide patency of both carotid artery systems within the neck. 2. Both vertebral arteries widely patent within the neck. Left vertebral artery strongly dominant, with a diffusely hypoplastic right vertebral artery. Electronically Signed   By: Jeannine Boga M.D.   On: 11/15/2019 20:13   MR BRAIN WO CONTRAST  Result Date: 11/15/2019 CLINICAL DATA:  Initial evaluation for acute neuro deficit, stroke suspected. EXAM: MRI HEAD WITHOUT CONTRAST MRA HEAD WITHOUT CONTRAST MRA NECK WITHOUT AND WITH CONTRAST TECHNIQUE: Multiplanar, multiecho pulse sequences of the brain and surrounding structures were obtained without intravenous contrast. Angiographic images of the Circle of Willis were obtained using MRA technique without intravenous contrast. Angiographic images of the neck were obtained using MRA technique without and with intravenous contrast. Carotid stenosis measurements (when applicable) are obtained utilizing NASCET criteria, using the distal internal carotid diameter as the denominator. CONTRAST:  5mL GADAVIST GADOBUTROL 1 MMOL/ML IV SOLN COMPARISON:  Recent MRI/MRA from 11/14/2019. FINDINGS: MRI HEAD FINDINGS Brain: There has been interval development of patchy restricted  diffusion involving the parasagittal left temporal occipital region, compatible with an acute left PCA territory infarct, overall moderate in size. Involvement is greatest at the mesial left temporal lobe/left hippocampal formation. Mild patchy involvement of the left thalamus noted as well. Additional punctate cortical infarct noted superiorly at the paramedian left parietal lobe (series 5, image thirty-five). No associated hemorrhage or mass effect. No other evidence for acute or interval infarction.  Gray-white matter differentiation otherwise maintained. No susceptibility artifact to suggest acute or chronic intracranial hemorrhage. Underlying atrophy with moderate chronic microvascular ischemic disease again noted. No mass lesion, mass effect, or midline shift. No hydrocephalus or extra-axial fluid collection. Pituitary gland and suprasellar region normal. Midline structures intact. Vascular: Major intracranial vascular flow voids are maintained. Skull and upper cervical spine: Craniocervical junction within normal limits. Postsurgical changes partially visualize within the upper cervical spine. Bone marrow signal intensity normal. No scalp soft tissue abnormality. Sinuses/Orbits: Subtle flattening with T2 hypointense thickening noted at the posterolateral aspects of the globes bilaterally, right slightly greater than left (series 10, image 8), of uncertain significance. Globes and orbital soft tissues otherwise unremarkable. Scattered mucosal thickening noted within the frontoethmoidal sinuses. Paranasal sinuses are otherwise clear. No significant mastoid effusion. Inner ear structures grossly normal. Other: None. MRA HEAD FINDINGS ANTERIOR CIRCULATION: Distal cervical segments of the internal carotid arteries are widely patent with antegrade flow. Mild atheromatous irregularity throughout the carotid siphons without hemodynamically significant stenosis or other abnormality. A1 segments, anterior communicating artery complex, and anterior cerebral arteries widely patent and within normal limits. No M1 stenosis or occlusion. Distal MCA branches well perfused and symmetric. POSTERIOR CIRCULATION: Vertebral arteries widely patent to the vertebrobasilar junction. Left vertebral artery strongly dominant, with a diffusely hypoplastic right vertebral artery. Both picas patent. Basilar widely patent to its distal aspect. Superior cerebral arteries patent bilaterally. Right PCA supplied via the basilar remains widely  patent. Interval occlusion of the left PCA since previous exam. Some attenuated flow is seen within the left posterior communicating artery. No intracranial aneurysm. MRA NECK FINDINGS AORTIC ARCH: Visualized aortic arch of normal caliber. Bovine branching pattern with common origin of the innominate and left common carotid arteries noted. No hemodynamically significant stenosis seen about the origin of the great vessels. RIGHT CAROTID SYSTEM: Right CCA widely patent from its origin to the bifurcation without stenosis. No significant atheromatous narrowing about the right bifurcation. Right ICA widely patent to the skull base without stenosis or other abnormality. LEFT CAROTID SYSTEM: Left CCA widely patent from its origin to the bifurcation without stenosis. No more than mild short-segment 20% stenosis at the origin of the left ICA. Left ICA otherwise widely patent distally without stenosis or other vascular abnormality. VERTEBRAL ARTERIES: Both vertebral arteries arise from subclavian arteries. No proximal subclavian artery stenosis. Left vertebral artery strongly dominant, with a diffusely hypoplastic right vertebral artery. Both vertebral arteries widely patent within the neck without stenosis or occlusion. IMPRESSION: MRI HEAD IMPRESSION: 1. 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 No associated mass effect. 2. Underlying atrophy with moderate chronic microvascular ischemic disease. 3. Subtle flattening with T2 hypointense thickening at the posterolateral aspects of the globes bilaterally, of uncertain significance. Correlation with history and funduscopic exam suggested. MRA HEAD IMPRESSION: 1. Interval occlusion of the left PCA since previous exam. 2. Otherwise stable intracranial MRA. No other large vessel occlusion. No other hemodynamically significant or correctable  stenosis. MRA NECK  IMPRESSION: 1. No more than mild short-segment stenosis at the origin of the left ICA. Otherwise wide patency of both carotid artery systems within the neck. 2. Both vertebral arteries widely patent within the neck. Left vertebral artery strongly dominant, with a diffusely hypoplastic right vertebral artery. Electronically Signed   By: Jeannine Boga M.D.   On: 11/15/2019 20:13   ECHOCARDIOGRAM COMPLETE  Result Date: 11/16/2019    ECHOCARDIOGRAM REPORT   Patient Name:   Philip Stone Date of Exam: 11/16/2019 Medical Rec #:  [id number]    Height:       70.0 in Accession #:    [id number]   Weight:       180.0 lb Date of Birth:  30-Jul-1950    BSA:          1.996 m Patient Age:    39 years     BP:           155/82 mmHg Patient Gender: M            HR:           67 bpm. Exam Location:  [facility] Procedure: 2D Echo Indications:    Stroke 434.91 / I163.9  History:        Patient has no prior history of Echocardiogram examinations.                 Stroke; Risk Factors:Former Smoker. CMT (Charcot-Marie-Tooth                 disease, GERD.  Sonographer:    Leavy Cella RDCS (AE) Referring Phys: [id number] OLADAPO ADEFESO IMPRESSIONS  1. Left ventricular ejection fraction, by estimation, is 60 to 65%. The left ventricle has normal function. The left ventricle has no regional wall motion abnormalities. There is mild left ventricular hypertrophy. Left ventricular diastolic parameters are consistent with Grade I diastolic dysfunction (impaired relaxation).  2. Right ventricular systolic function is normal. The right ventricular size is normal.  3. The mitral valve is normal in structure. No evidence of mitral valve regurgitation. No evidence of mitral stenosis.  4. The aortic valve is tricuspid. Aortic valve regurgitation is not visualized. No aortic stenosis is present.  5. The inferior vena cava is normal in size with greater than 50% respiratory variability, suggesting right atrial pressure of 3  mmHg. FINDINGS  Left Ventricle: Left ventricular ejection fraction, by estimation, is 60 to 65%. The left ventricle has normal function. The left ventricle has no regional wall motion abnormalities. The left ventricular internal cavity size was normal in size. There is  mild left ventricular hypertrophy. Left ventricular diastolic parameters are consistent with Grade I diastolic dysfunction (impaired relaxation). Normal left ventricular filling pressure. Right Ventricle: The right ventricular size is normal. No increase in right ventricular wall thickness. Right ventricular systolic function is normal. Left Atrium: Left atrial size was normal in size. Right Atrium: Right atrial size was normal in size. Pericardium: There is no evidence of pericardial effusion. Mitral Valve: The mitral valve is normal in structure. No evidence of mitral valve regurgitation. No evidence of mitral valve stenosis. Tricuspid Valve: The tricuspid valve is normal in structure. Tricuspid valve regurgitation is trivial. No evidence of tricuspid stenosis. Aortic Valve: The aortic valve is tricuspid. Aortic valve regurgitation is not visualized. No aortic stenosis is present. Aortic valve mean gradient measures 3.2 mmHg. Aortic valve peak gradient measures 5.4 mmHg. Aortic valve area, by VTI measures 3.93 cm. Pulmonic Valve: The pulmonic valve was not well  visualized. Pulmonic valve regurgitation is not visualized. No evidence of pulmonic stenosis. Aorta: The aortic root is normal in size and structure. Venous: The inferior vena cava is normal in size with greater than 50% respiratory variability, suggesting right atrial pressure of 3 mmHg. IAS/Shunts: The interatrial septum was not well visualized.  LEFT VENTRICLE PLAX 2D LVIDd:         4.39 cm  Diastology LVIDs:         2.15 cm  LV e' medial:    5.77 cm/s LV PW:         1.13 cm  LV E/e' medial:  11.2 LV IVS:        1.25 cm  LV e' lateral:   8.49 cm/s LVOT diam:     2.00 cm  LV E/e' lateral:  7.6 LV SV:         91 LV SV Index:   45 LVOT Area:     3.14 cm  RIGHT VENTRICLE RV S prime:     16.90 cm/s TAPSE (M-mode): 1.9 cm LEFT ATRIUM             Index       RIGHT ATRIUM           Index LA diam:        3.70 cm 1.85 cm/m  RA Area:     10.20 cm LA Vol (A2C):   41.7 ml 20.89 ml/m RA Volume:   23.60 ml  11.82 ml/m LA Vol (A4C):   36.6 ml 18.34 ml/m LA Biplane Vol: 39.6 ml 19.84 ml/m  AORTIC VALVE AV Area (Vmax):    3.53 cm AV Area (Vmean):   3.33 cm AV Area (VTI):     3.93 cm AV Vmax:           116.35 cm/s AV Vmean:          84.084 cm/s AV VTI:            0.231 m AV Peak Grad:      5.4 mmHg AV Mean Grad:      3.2 mmHg LVOT Vmax:         130.73 cm/s LVOT Vmean:        89.136 cm/s LVOT VTI:          0.289 m LVOT/AV VTI ratio: 1.25  AORTA Ao Root diam: 3.10 cm MITRAL VALVE MV Area (PHT): 2.46 cm     SHUNTS MV Decel Time: 309 msec     Systemic VTI:  0.29 m MV E velocity: 64.50 cm/s   Systemic Diam: 2.00 cm MV A velocity: 133.00 cm/s MV E/A ratio:  0.48 Carlyle Dolly MD Electronically signed by Carlyle Dolly MD Signature Date/Time: 11/16/2019/10:52:08 AM    Final        Medical Problem List and Plan: 1.  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  Antithrombotics: -DVT/anticoagulation: Lovenox  -antiplatelet therapy: Plavix 75 mg daily 3. Pain Management: Tylenol as needed 4. Mood: Provide emotional support  -antipsychotic agents: N/A 5. Neuropsych: This patient is capable of making decisions on his own behalf. 6. Skin/Wound Care: Routine skin checks 7. Fluids/Electrolytes/Nutrition: Routine in and outs.  CMP ordered. 8.  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  Follow-up  outpatient  9.  Hyperlipidemia.  Lipitor 10.  BPH.  Resume tamsulosin 11.  Elevated blood pressure reading  Monitor with increased mobility  Cathlyn Parsons, PA-C 11/17/2019  I have personally performed a face to face diagnostic evaluation, including, but not limited to relevant history and physical exam findings, of this patient and developed relevant assessment and plan.  Additionally, I have reviewed and concur with the physician assistant's documentation above.  Delice Lesch, MD, ABPMR

## 2019-11-18 ENCOUNTER — Inpatient Hospital Stay (HOSPITAL_COMMUNITY): Payer: PPO | Admitting: Physical Therapy

## 2019-11-18 ENCOUNTER — Inpatient Hospital Stay (HOSPITAL_COMMUNITY): Payer: PPO | Admitting: Occupational Therapy

## 2019-11-18 ENCOUNTER — Inpatient Hospital Stay (HOSPITAL_COMMUNITY): Payer: PPO | Admitting: Speech Pathology

## 2019-11-18 DIAGNOSIS — I639 Cerebral infarction, unspecified: Secondary | ICD-10-CM

## 2019-11-18 NOTE — Evaluation (Signed)
Physical Therapy Assessment and Plan  Patient Details  Name: Philip Stone MRN: [id number] Date of Birth: 02-17-1950  PT Diagnosis: Abnormal posture, Abnormality of gait, Ataxia, Ataxic gait, Coordination disorder, Hemiparesis dominant, Impaired sensation and Muscle weakness Rehab Potential: Good ELOS: 21-24 days   Today's Date: 11/18/2019 PT Individual Time: 1300-1410 PT Individual Time Calculation (min): 70 min    Hospital Problem: Principal Problem:   Left thalamic infarction [facility])   Past Medical History:  Past Medical History:  Diagnosis Date  . Arthritis   . BPH (benign prostatic hyperplasia)   . Charcot-Marie-Tooth disease   . Foot drop, right   . Neuropathy   . Platelets decreased ([place])   . Spinal stenosis    Past Surgical History:  Past Surgical History:  Procedure Laterality Date  . CERVICAL DISC SURGERY  10 YEARS AGO   reports there are plates and screws in place   . FOOT ARTHROPLASTY    . INGUINAL HERNIA REPAIR Right 06/15/2014   Procedure: RIGHT INGUINAL HERNIORRHAPHY  WITH MESH;  Surgeon: Aviva Signs Md, MD;  Location: [facility];  Service: General;  Laterality: Right;  . INSERTION OF MESH Right 06/15/2014   Procedure: INSERTION OF MESH RIGHT INGUINAL HERNIORRHAPHY;  Surgeon: Aviva Signs Md, MD;  Location: [facility];  Service: General;  Laterality: Right;  . KNEE ARTHROSCOPY Right   . TOTAL KNEE ARTHROPLASTY Right 01/21/2018   Procedure: TOTAL KNEE ARTHROPLASTY;  Surgeon: Sydnee Cabal, MD;  Location: [facility];  Service: Orthopedics;  Laterality: Right;    Assessment & Plan Clinical Impression: Patient is a Philip Stone is a 69 year old right-handed male with history significant for Charcot-Marie-Tooth disease, right foot drop with neuropathy, cervical stenosis with decompression and discectomy 10 years ago, right TKA 01/21/2018, quit smoking 13 years ago.  History taken from chart review due to aphasia.  Patient lives alone 1 level home 2 steps to entry independent with  assistive device and still driving.  He does have a girlfriend with good support.  He presented on 11/15/2019 to [facility] with right side hemiparesis and numbness x3 days as well as aphasia.  Initial MRI/MRA showed no acute infarction hemorrhage or mass.  Moderate chronic microvascular ischemic changes.  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  No associated mass-effect.  MRA of the head interval occlusion of left PCA since previous exam.  No other large vessel occlusion.  MRA of the neck no more than mild short segment stenosis of the origin of the left ICA.  Both vertebral arteries widely patent within the neck.  Patient did not receive TPA.  Echocardiogram with ejection fraction of 60 to 65%, no wall motion abnormalities.  Grade 1 diastolic dysfunction. Admission chemistries were unremarkable, hemoglobin 15.2, platelets 126.  Neurology follow-up Dr.[place] and maintained on Plavix for CVA prophylaxis.  Subcutaneous Lovenox for DVT prophylaxis.  Tolerating a regular diet.  Noted on 11/17/2019 patient found sitting on the floor in front of his bed with soiled linens no apparent injury except for abrasion left knee.  Therapy evaluations completed and patient was admitted for a comprehensive rehab program.  Patient transferred to CIR on 11/17/2019 .   Patient currently requires max with mobility secondary to muscle weakness and muscle joint tightness, decreased cardiorespiratoy endurance, impaired timing and sequencing, unbalanced muscle activation, motor apraxia, decreased  coordination and decreased motor planning, decreased visual acuity, field cut and hemianopsia, decreased attention to right, right side neglect and ideational apraxia, decreased attention, decreased awareness, decreased  problem solving, decreased safety awareness, decreased memory and delayed processing and decreased sitting balance, decreased standing balance, decreased postural control, hemiplegia and decreased balance strategies.  Prior to hospitalization, patient was modified independent  with mobility and lived with Alone in a House home.  Home access is 2Stairs to enter.  Patient will benefit from skilled PT intervention to maximize safe functional mobility, minimize fall risk and decrease caregiver burden for planned discharge home with 24 hour assist.  Anticipate patient will benefit from follow up [facility] at discharge.  PT - End of Session Activity Tolerance: Tolerates 10 - 20 min activity with multiple rests Endurance Deficit: No PT Assessment Rehab Potential (ACUTE/IP ONLY): Good PT Barriers to Discharge: [place] home environment;Decreased caregiver support;Home environment access/layout;Insurance for SNF coverage PT Patient demonstrates impairments in the following area(s): Balance;Behavior;Endurance;Motor;Pain;Perception;Sensory;Safety;Skin Integrity PT Transfers Functional Problem(s): Bed Mobility;Bed to Chair;Car;Furniture;Floor PT Locomotion Functional Problem(s): Ambulation;Wheelchair Mobility;Stairs PT Plan PT Intensity: Minimum of 1-2 x/day ,45 to 90 minutes PT Frequency: 5 out of 7 days PT Duration Estimated Length of Stay: 21-24 days PT Treatment/Interventions: Discharge planning;Ambulation/gait training;Balance/vestibular training;Cognitive remediation/compensation;Community reintegration;Functional electrical stimulation;DME/adaptive equipment instruction;Disease management/prevention;Functional mobility training;Psychosocial support;Visual/perceptual remediation/compensation;Therapeutic Activities;Neuromuscular re-education;Skin care/wound management;Therapeutic Exercise;Wheelchair propulsion/positioning;Pain management;Splinting/orthotics;UE/LE Strength taining/ROM;Patient/family  education;Stair training;UE/LE Coordination activities PT Transfers Anticipated Outcome(s): Supervision assit transfers PT Locomotion Anticipated Outcome(s): CGA ambulatory with LRAD athouse hold level PT Recommendation Follow Up Recommendations: Home health PT Patient destination: Home Equipment Recommended: Rolling walker with 5" wheels;Wheelchair (measurements);Wheelchair cushion (measurements)   PT Evaluation Precautions/Restrictions   General   Vital SignsTherapy Vitals Temp: 97.9 F (36.6 C) Pulse Rate: 76 Resp: 18 BP: 131/84 Patient Position (if appropriate): Sitting Oxygen Therapy SpO2: 95 % O2 Device: Room Air Pain   Home Living/Prior Functioning Home Living Available Help at Discharge: Available 24 hours/day Type of Home: House Home Access: Stairs to enter Technical brewer of Steps: 2 Entrance Stairs-Rails: None Home Layout: One level Bathroom Shower/Tub: Tub/shower unit (Per chart review, patient reported walk-in shower) Bathroom Toilet: Standard Bathroom Accessibility: Yes  Lives With: Alone Prior Function Level of Independence: Independent with basic ADLs;Independent with homemaking with ambulation  Able to Take Stairs?: Yes Driving: Yes 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, drives, wears bilateral ankle braces - inconsistent historian, question fluent expressive aphasia Vision/Perception  Vision - Assessment Additional Comments: R inattention Perception Inattention/Neglect: Does not attend to right visual field;Does not attend to right side of body Praxis Praxis: Impaired Praxis Impairment Details: Motor planning;Perseveration;Ideation  Cognition Overall Cognitive Status: Impaired/Different from baseline Arousal/Alertness: Awake/alert Attention: Sustained Sustained Attention: Impaired Memory: Impaired Memory Impairment: Decreased short term memory;Retrieval deficit;Decreased recall of new  information Immediate Memory Recall: Sock;Blue;Bed Memory Recall Sock: Without Cue Memory Recall Blue: Without Cue Memory Recall Bed: Not able to recall Awareness: Impaired Safety/Judgment: Impaired Sensation Sensation Light Touch: Impaired Detail Light Touch Impaired Details: Absent RLE;Absent RUE Hot/Cold: Impaired Detail Hot/Cold Impaired Details: Absent RUE;Absent RLE Proprioception: Impaired by gross assessment;Impaired Detail Proprioception Impaired Details: Absent RUE;Absent RLE Stereognosis: Impaired by gross assessment Coordination Gross Motor Movements are Fluid and Coordinated: No Fine Motor Movements are Fluid and Coordinated: No Coordination and Movement Description: ataxia from severe sensory deficits Motor  Motor Motor: Hemiplegia;Motor apraxia;Ataxia   Trunk/Postural Assessment  Cervical Assessment Cervical Assessment: Exceptions to [facility] (s/p cervical sx years ago) Thoracic Assessment Thoracic Assessment: Exceptions to [facility] (  Slightly kyphotic) Lumbar Assessment Lumbar Assessment: Exceptions to [facility] Postural Control Postural Control: Deficits on evaluation (Delayed, absent.)  Balance Static Sitting Balance Static Sitting - Level of Assistance: 6: Modified independent (Device/Increase time) Dynamic Sitting Balance Dynamic Sitting - Level of Assistance: 5: Stand by assistance Sitting balance - Comments: Fair seated EOB with unilateral UE support on bed surface. Static Standing Balance Static Standing - Level of Assistance: 3: Mod assist Dynamic Standing Balance Dynamic Standing - Level of Assistance: Not tested (comment) Extremity Assessment  RUE Assessment RUE Assessment: Exceptions to [facility] Passive Range of Motion (PROM) Comments: WFL LUE Assessment LUE Assessment: Within Functional Limits Passive Range of Motion (PROM) Comments: WFL Active Range of Motion (AROM) Comments: WFL General Strength Comments: 4+/5 RLE Assessment RLE Assessment: Exceptions to  [facility] General Strength Comments: grossly 4/5 functionally, but difficulty with MMT due to sensory and awareness deficits. LLE Assessment LLE Assessment: Within Functional Limits  Care Tool Care Tool Bed Mobility Roll left and right activity   Roll left and right assist level: Moderate Assistance - Patient 50 - 74%    Sit to lying activity   Sit to lying assist level: Moderate Assistance - Patient 50 - 74%    Lying to sitting edge of bed activity   Lying to sitting edge of bed assist level: Moderate Assistance - Patient 50 - 74%     Care Tool Transfers Sit to stand transfer   Sit to stand assist level: Maximal Assistance - Patient 25 - 49%    Chair/bed transfer   Chair/bed transfer assist level: Maximal Assistance - Patient 25 - 49%     Toilet transfer   Assist Level: Maximal Assistance - Patient 24 - 49%    Car transfer Car transfer activity did not occur: Safety/medical concerns        Care Tool Locomotion Ambulation   Assist level: Total Assistance - Patient < 25% Assistive device: Hand held assist Max distance: 3  Walk 10 feet activity   Assist level: Total Assistance - Patient < 25% Assistive device: Hand held assist   Walk 50 feet with 2 turns activity Walk 50 feet with 2 turns activity did not occur: Safety/medical concerns      Walk 150 feet activity Walk 150 feet activity did not occur: Safety/medical concerns      Walk 10 feet on uneven surfaces activity Walk 10 feet on uneven surfaces activity did not occur: Safety/medical concerns      Stairs Stair activity did not occur: Safety/medical concerns        Walk up/down 1 step activity Walk up/down 1 step or curb (drop down) activity did not occur: Safety/medical concerns     Walk up/down 4 steps activity did not occuR: Safety/medical concerns  Walk up/down 4 steps activity      Walk up/down 12 steps activity Walk up/down 12 steps activity did not occur: Safety/medical concerns      Pick up small  objects from floor Pick up small object from the floor (from standing position) activity did not occur: Safety/medical concerns      Wheelchair       Wheelchair assist level: Maximal Assistance - Patient 25 - 49% Max wheelchair distance: 150  Wheel 50 feet with 2 turns activity   Assist Level: Maximal Assistance - Patient 25 - 49%  Wheel 150 feet activity   Assist Level: Maximal Assistance - Patient 25 - 49%    Refer to Care Plan for [place]  GOAL WEEK 1 PT Short Term Goal 1 (Week 1): Pt will propell WC 121f with min assist and hemi technique PT Short Term Goal 2 (Week 1): Pt will perform bed mobility with min assist PT Short Term Goal 3 (Week 1): [facility] Short Term Goal 4 (Week 1): Pt will ambulate 328fwith mod assist  Recommendations for other services: None   Skilled Therapeutic Intervention  Pt received supine in bed and agreeable to PT. Supine>sit transfer with mod assist and for decreased speed and attention to the R. PT instructed patient in PT Evaluation and initiated treatment intervention; see above for results. PT educated [place] in [facility] [place] [place], rehab goals, and discharge recommendations. Pt unable to perform stairs or car transfer due to severe sequencing and sensory deficits . Severe R sided field cut/hemiopsia also limiting safety. dyanmic balance training performed with visual feedback from mirror to improve midline orientation and force attention to the R visual field to locate targets on the R. PT assisted to maintain UE support on the RUE on RW. Pt returned to room and performed stand pivot transfer to bed with UE support on bed rail. Sit>supine completed with mod assist for LE management, and left supine in bed with call bell in reach and all needs met.     Mobility Bed Mobility Bed Mobility: Rolling Right;Rolling Left;Right Sidelying to Sit;Sit to Sidelying Right Rolling Right: Minimal Assistance -  Patient > 75% Rolling Left: Moderate Assistance - Patient 50-74% Right Sidelying to Sit: Moderate Assistance - Patient 50-74% Sit to Supine: Minimal Assistance - Patient > 75% Sit to Sidelying Right: Minimal Assistance - Patient > 75% Transfers Transfers: Sit to Stand;Stand to Sit;Squat Pivot Transfers Squat Pivot Transfers: Maximal Assistance - Patient 25-49% Locomotion  Gait Ambulation: Yes Gait Assistance: Total Assistance - Patient < 25% Gait Distance (Feet): 3 Feet Assistive device: 1 person hand held assist Gait Assistance Details: Verbal cues for gait pattern;Verbal cues for safe use of DME/AE;Manual facilitation for weight shifting;Manual facilitation for placement;Manual facilitation for weight bearing;Verbal cues for precautions/safety;Verbal cues for technique;Verbal cues for sequencing Gait Assistance Details: Pt then performed gait training at rail in hall 2 x 15 ft with max assist and max cues for sequencing Gait Gait: Yes Gait Pattern: Impaired Gait Pattern: Step-to pattern;Narrow base of support;Ataxic Stairs / Additional Locomotion Stairs: No WhArchitectYes Wheelchair Assistance: Maximal Assistance - Patient 25 - 49% Wheelchair Propulsion: Left upper extremity Wheelchair Parts Management: Needs assistance Distance: 150   Discharge Criteria: Patient will be discharged from PT if patient refuses treatment 3 consecutive times without medical reason, if treatment goals not met, if there is a change in medical status, if patient makes no progress towards goals or if patient is discharged from hospital.  The above assessment, treatment plan, treatment alternatives and goals were discussed and mutually agreed upon: by patient  Philip Phenix0/10/2019, 3:25 PM

## 2019-11-18 NOTE — Progress Notes (Signed)
[place] PHYSICAL MEDICINE & REHABILITATION PROGRESS NOTE   Subjective/Complaints:   Pt reports no issues- all set- denies pain; bowels working OK.   ROS:  Pt denies SOB, abd pain, CP, N/V/C/D, and vision changes   Objective:   No results found. Recent Labs    11/16/19 0449 11/17/19 1343  WBC 7.5 7.3  HGB 15.4 15.6  HCT 44.1 45.5  PLT 139* 143*   Recent Labs    11/15/19 1741 11/15/19 1741 11/16/19 0449 11/17/19 1343  NA 137  --  139  --   K 3.4*  --  4.2  --   CL 103  --  106  --   CO2 23  --  24  --   GLUCOSE 102*  --  102*  --   BUN 13  --  14  --   CREATININE 0.93   < > 0.92 1.06  CALCIUM 9.2  --  9.2  --    < > = values in this interval not displayed.    Intake/Output Summary (Last 24 hours) at 11/18/2019 1348 Last data filed at 11/18/2019 1248 Gross per 24 hour  Intake 417 ml  Output 400 ml  Net 17 ml     Physical Exam: Vital Signs Blood pressure 131/84, pulse 76, temperature 97.9 F (36.6 C), resp. rate 18, SpO2 95 %.  Physical Exam  Constitutional:  awake, alert, appropriate, sitting up in bed, therapy in room, NAD HENT: conjugate gaze  Cardiovascular: RRR  Pulmonary: CTA B/L- no W/R/R- good air movement Abdominal: Soft, NT, ND, (+)BS  Musculoskeletal:     Cervical back: Normal range of motion and neck supple.     Comments: No edema or tenderness in extremities  Neurological:     Mental Status: He is alert.     Comments: AOx1-2 Moderate expressive aphasia LUE: 5/5 proximal distal LLE: Hip flexion, knee extension 4-4+/5, ankle dorsiflexion 1/5 RUE: Appears to be 4/5 proximal to distal, limited due to apraxia RLE: Hip flexion, knee extension 2+/5, ankle dorsiflexion 0/5  Psychiatric:  flat affect  Assessment/Plan: 1. Functional deficits secondary R hemiparesis and aphasia due to L medial temporal, L thalamic and L occipital with L PCA stroke to which require 3+ hours per day of interdisciplinary therapy in a comprehensive inpatient  rehab setting.  Physiatrist is providing close team supervision and 24 hour management of active medical problems listed below.  Physiatrist and rehab team continue to assess barriers to discharge/monitor patient progress toward functional and medical goals  Care Tool:  Bathing    Body parts bathed by patient: Right arm, Chest, Abdomen, Front perineal area, Right upper leg, Left upper leg, Face   Body parts bathed by helper: Left arm, Buttocks, Right lower leg, Left lower leg     Bathing assist Assist Level: Moderate Assistance - Patient 50 - 74%     Upper Body Dressing/Undressing Upper body dressing   What is the patient wearing?: Pull over shirt    Upper body assist Assist Level: Moderate Assistance - Patient 50 - 74%    Lower Body Dressing/Undressing Lower body dressing      What is the patient wearing?: Pants     Lower body assist Assist for lower body dressing: Maximal Assistance - Patient 25 - 49%     Toileting Toileting    Toileting assist Assist for toileting: Maximal Assistance - Patient 25 - 49%     Transfers Chair/bed transfer  Transfers assist  Locomotion Ambulation   Ambulation assist              Walk 10 feet activity   Assist           Walk 50 feet activity   Assist           Walk 150 feet activity   Assist           Walk 10 feet on uneven surface  activity   Assist           Wheelchair     Assist               Wheelchair 50 feet with 2 turns activity    Assist            Wheelchair 150 feet activity     Assist          Blood pressure 131/84, pulse 76, temperature 97.9 F (36.6 C), resp. rate 18, SpO2 95 %.  Medical Problem List and Plan: 1.  Right side weakness and aphasia secondary to acute infarct involving the left medial temporal region, left thalamic area and left occipital region consistent with infarct involving the left posterior cerebral artery  distribution  10/9- starting PT, OT and SLP             -patient may shower             -ELOS/Goals: 13-16 days/supervision/min a             Admit to CIR 2.  Antithrombotics: -DVT/anticoagulation: Lovenox             -antiplatelet therapy: Plavix 75 mg daily 3. Pain Management: Tylenol as needed  10/9- no complaints of pain- con't tylenol prn 4. Mood: Provide emotional support             -antipsychotic agents: N/A 5. Neuropsych: This patient is capable of making decisions on his own behalf. 6. Skin/Wound Care: Routine skin checks 7. Fluids/Electrolytes/Nutrition: Routine in and outs.  CMP ordered. 8.  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  Follow-up outpatient 9.  Hyperlipidemia.  Lipitor 10.  BPH.  Resume tamsulosin  10/9- voiding better- con't Flomax 11.  Elevated blood pressure reading  10/9- 130s/80s- con't no meds             Monitor with increased mobility    LOS: 1 days A FACE TO FACE EVALUATION WAS PERFORMED  Araiyah Cumpton 11/18/2019, 1:48 PM

## 2019-11-18 NOTE — Progress Notes (Signed)
Occupational Therapy Assessment and Plan  Patient Details  Name: Philip Stone MRN: [id number] Date of Birth: October 18, 1950  OT Diagnosis: apraxia, cognitive deficits, disturbance of vision, flaccid hemiplegia and hemiparesis, hemiplegia affecting dominant side and muscle weakness (generalized) Rehab Potential: Rehab Potential (ACUTE ONLY): Good ELOS: 2.5-3 weeks   Today's Date: 11/18/2019 OT Individual Time: 5400-8676 OT Individual Time Calculation (min): 79 min     Hospital Problem: Active Problems:   Left thalamic infarction [facility])   Past Medical History:  Past Medical History:  Diagnosis Date  . Arthritis   . BPH (benign prostatic hyperplasia)   . Charcot-Marie-Tooth disease   . Foot drop, right   . Neuropathy   . Platelets decreased ([place])   . Spinal stenosis    Past Surgical History:  Past Surgical History:  Procedure Laterality Date  . CERVICAL DISC SURGERY  10 YEARS AGO   reports there are plates and screws in place   . FOOT ARTHROPLASTY    . INGUINAL HERNIA REPAIR Right 06/15/2014   Procedure: RIGHT INGUINAL HERNIORRHAPHY  WITH MESH;  Surgeon: Aviva Signs Md, MD;  Location: [facility];  Service: General;  Laterality: Right;  . INSERTION OF MESH Right 06/15/2014   Procedure: INSERTION OF MESH RIGHT INGUINAL HERNIORRHAPHY;  Surgeon: Aviva Signs Md, MD;  Location: [facility];  Service: General;  Laterality: Right;  . KNEE ARTHROSCOPY Right   . TOTAL KNEE ARTHROPLASTY Right 01/21/2018   Procedure: TOTAL KNEE ARTHROPLASTY;  Surgeon: Sydnee Cabal, MD;  Location: [facility];  Service: Orthopedics;  Laterality: Right;    Assessment & Plan Clinical Impression:  Philip Stone is a 69 year old right-handed male with history significant for Charcot-Marie-Tooth disease, right foot drop with neuropathy, cervical stenosis with decompression and discectomy 10 years ago, right TKA 01/21/2018, quit smoking 13 years ago.  History taken from chart review due to aphasia.  Patient lives alone 1 level  home 2 steps to entry independent with assistive device and still driving.  He does have a girlfriend with good support.  He presented on 11/15/2019 to [facility] with right side hemiparesis and numbness x3 days as well as aphasia.  Initial MRI/MRA showed no acute infarction hemorrhage or mass.  Moderate chronic microvascular ischemic changes.  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  No associated mass-effect.  MRA of the head interval occlusion of left PCA since previous exam.  No other large vessel occlusion.  MRA of the neck no more than mild short segment stenosis of the origin of the left ICA.  Both vertebral arteries widely patent within the neck.  Patient did not receive TPA.  Echocardiogram with ejection fraction of 60 to 65%, no wall motion abnormalities.  Grade 1 diastolic dysfunction. Admission chemistries were unremarkable, hemoglobin 15.2, platelets 126.  Neurology follow-up Dr.[place] and maintained on Plavix for CVA prophylaxis.  Subcutaneous Lovenox for DVT prophylaxis. Patient transferred to CIR on 11/17/2019 .    Patient currently requires mod with basic self-care skills secondary to muscle weakness and muscle paralysis, impaired timing and sequencing, abnormal tone, unbalanced muscle activation, motor apraxia, decreased coordination and decreased motor planning, field cut, decreased attention to right and decreased initiation, decreased attention, decreased awareness, decreased problem solving, decreased safety awareness, decreased memory and delayed processing.  Prior to hospitalization, patient could complete BADLs/IADLs with modified independence.  Patient will benefit from skilled intervention to decrease level of assist with basic self-care skills and increase  independence with basic self-care skills prior to discharge home with care partner.  Anticipate patient will require 24 hour supervision and minimal physical assistance and follow up home health.  OT - End of Session Activity Tolerance: Improving Endurance Deficit: No OT Assessment Rehab Potential (ACUTE ONLY): Good OT Barriers to Discharge: Inaccessible home environment;Decreased caregiver support OT Patient demonstrates impairments in the following area(s): Balance;Cognition;Motor;Perception;Safety;Vision OT Basic ADL's Functional Problem(s): Eating;Grooming;Bathing;Dressing;Toileting OT Transfers Functional Problem(s): Toilet;Tub/Shower OT Additional Impairment(s): Fuctional Use of Upper Extremity OT Plan OT Intensity: Minimum of 1-2 x/day, 45 to 90 minutes OT Frequency: 5 out of 7 days OT Duration/Estimated Length of Stay: 2.5-3 weeks OT Treatment/Interventions: Balance/vestibular training;Cognitive remediation/compensation;Community reintegration;Discharge planning;DME/adaptive equipment instruction;Functional electrical stimulation;Functional mobility training;Neuromuscular re-education;Patient/family education;Self Care/advanced ADL retraining;Therapeutic Activities;Therapeutic Exercise;UE/LE Strength taining/ROM;UE/LE Coordination activities;Visual/perceptual remediation/compensation OT Self Feeding Anticipated Outcome(s): Independent OT Basic Self-Care Anticipated Outcome(s): Mod I to supervision A OT Toileting Anticipated Outcome(s): Supervision A OT Bathroom Transfers Anticipated Outcome(s): Supervision A OT Recommendation Patient destination: Home Follow Up Recommendations: 24 hour supervision/assistance Equipment Recommended: To be determined   OT Evaluation Precautions/Restrictions  Precautions Precautions: Fall Required Braces or Orthoses: Other Brace Other Brace: bilateral AFO's in shoes Restrictions Weight Bearing Restrictions: No General Chart Reviewed: Yes  Family/Caregiver Present: No Vital Signs Therapy Vitals Temp: 97.9 F (36.6 C) Pulse Rate: 76 Resp: 18 BP: 131/84 Patient Position (if appropriate): Sitting Oxygen Therapy SpO2: 95 % O2 Device: Room Air Pain Pain Assessment Pain Scale: 0-10 Pain Score: 0-No pain Home Living/Prior Functioning Home Living Available Help at Discharge: Available 24 hours/day Type of Home: House Home Access: Stairs to enter Technical brewer of Steps: 2 Entrance Stairs-Rails: None Home Layout: One level Bathroom Shower/Tub: Tub/shower unit (Per chart review, patient reported walk-in shower) Bathroom Toilet: Programmer, systems: Yes  Lives With: Alone IADL History Homemaking Responsibilities: Yes Current License: Yes Mode of Transportation: Car Prior Function Level of Independence: Independent with basic ADLs, Independent with homemaking with ambulation  Able to Take Stairs?: Yes Driving: Yes 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 Perception  Inattention/Neglect: Does not attend to right visual field;Does not attend to right side of body Praxis Praxis: Impaired Praxis Impairment Details: Motor planning;Perseveration;Ideation Praxis-Other Comments: Unable to plan movements related to interaction with objects Cognition Overall Cognitive Status: Impaired/Different from baseline Arousal/Alertness: Awake/alert Orientation Level: Person Year: Other (Comment) ("1986") Month: April Day of Week: Correct Memory: Impaired Memory Impairment: Decreased short term memory;Retrieval deficit;Decreased recall of new information  Immediate Memory Recall: Sock;Blue;Bed Memory Recall Sock: Without Cue Memory Recall Blue: Without Cue Memory Recall Bed: Not able to recall Attention: Sustained Sustained Attention: Impaired Awareness: Impaired Safety/Judgment: Impaired Sensation Sensation Light Touch: Impaired by gross assessment Hot/Cold: Impaired by gross assessment Proprioception: Impaired by gross assessment Stereognosis: Impaired by gross assessment Coordination Gross Motor Movements are Fluid and Coordinated: No Fine Motor Movements are Fluid and Coordinated: No Motor  Motor Motor: Hemiplegia;Motor apraxia  Trunk/Postural Assessment  Cervical Assessment Cervical Assessment: Exceptions to [facility] (s/p cervical sx years ago) Thoracic Assessment Thoracic Assessment: Exceptions to [facility] (Slightly kyphotic) Lumbar Assessment Lumbar Assessment: Exceptions to [facility] Postural Control Postural Control: Deficits on evaluation (Delayed, absent.)  Balance Static Sitting Balance Static Sitting - Level of Assistance: 6: Modified independent (Device/Increase time) Dynamic Sitting Balance Dynamic Sitting - Level of Assistance: 5: Stand by assistance Sitting balance - Comments: Fair seated EOB with unilateral UE support on bed surface. Static Standing Balance Static Standing - Level of Assistance: 3: Mod assist Dynamic Standing Balance Dynamic Standing - Level of Assistance: Not tested (comment) Extremity/Trunk Assessment RUE Assessment RUE Assessment: Exceptions to [facility] Passive Range of Motion (PROM) Comments: WFL LUE Assessment LUE Assessment: Within Functional Limits Passive Range of Motion (PROM) Comments: WFL Active Range of Motion (AROM) Comments: WFL General Strength Comments: 4+/5  Care Tool Care Tool Self Care Eating        Oral Care    Oral Care Assist Level: Minimal Assistance - Patient > 75%    Bathing   Body parts bathed by patient: Right arm;Chest;Abdomen;Front perineal area;Right upper leg;Left  upper leg;Face Body parts bathed by helper: Left arm;Buttocks;Right lower leg;Left lower leg   Assist Level: Moderate Assistance - Patient 50 - 74%    Upper Body Dressing(including orthotics)   What is the patient wearing?: Pull over shirt   Assist Level: Moderate Assistance - Patient 50 - 74%    Lower Body Dressing (excluding footwear)   What is the patient wearing?: Pants Assist for lower body dressing: Maximal Assistance - Patient 25 - 49%    Putting on/Taking off footwear   What is the patient wearing?: Socks;AFO;Shoes Assist for footwear: Total Assistance - Patient < 25%       Care Tool Toileting Toileting activity   Assist for toileting: Maximal Assistance - Patient 25 - 49%     Care Tool Bed Mobility Roll left and right activity   Roll left and right assist level: Moderate Assistance - Patient 50 - 74%    Sit to lying activity   Sit to lying assist level: Moderate Assistance - Patient 50 - 74%    Lying to sitting edge of bed activity   Lying to sitting edge of bed assist level: Moderate Assistance - Patient 50 - 74%     Care Tool Transfers Sit to stand transfer        Chair/bed transfer   Chair/bed transfer assist level: Maximal Assistance - Patient 25 - 49%     Toilet transfer   Assist Level: Maximal Assistance - Patient 24 - 49%     Care Tool Cognition Expression of Ideas and Wants Expression of Ideas and Wants: Some difficulty - exhibits some difficulty with expressing needs and ideas (e.g, some words or finishing thoughts) or speech is not clear   Understanding Verbal and Non-Verbal Content Understanding Verbal and Non-Verbal Content: Usually understands - understands most conversations, but misses some part/intent of message. Requires cues at times to understand   Memory/Recall Ability *first 3 days only Memory/Recall Ability *first 3 days only: That he or she is in a hospital/hospital unit;Current season    Refer to Care Plan for Long Term Goals   SHORT TERM GOAL WEEK 1 OT Short Term Goal 1 (Week 1): Patient will complete functional transfers with Mod A and LRAD. OT Short Term Goal 2 (Week 1): Patient will don UB clothing with Min A and use of hemi technique. OT Short Term Goal 3 (Week 1): Patient will recall and demonstrate 3 visual compensatory strategies in prep for ADLs. OT Short Term Goal 4 (Week 1): Patient will thread BLE through LB clothing seated EOB.  Recommendations for other services: Other: TBD  Skilled Therapeutic Intervention Patient met lying supine in bed in agreement with OT assessment. OT provided education on process of rehab, establishment of POC, and ELOS with patient expressing verbal understanding. Patient is a questionable historian with inconsistencies in PLOF and home set-up information provided this date in comparison to information provided during acute care evaluation. Patient able to advance LLE toward EOB with assist at RLE and trunk to bring upright to sitting position. Patient initially requiring assist for sitting balance but progressed to close supervision A. Max A squat-pivot transfer to wc on L with noted increased difficulty processing verbal/tactile information despite cueing. Patient also demonstrates impulsivity and decreased attention to R side of body and R visual field with inability to track in RUQ or RLQ. Patient also unable to see anything in periphery on R. Vision to be further assessed. Sit to stand in [place] with Min A for return to supine. Session concluded with patient lying in bed with call bell within reach and all needs met. Bed turned to allow patient to see television despite visual field cut.   ADL ADL Grooming: Minimal assistance;Moderate cueing Where Assessed-Grooming: Sitting at sink Upper Body Bathing: Moderate assistance;Moderate cueing Where Assessed-Upper Body Bathing: Sitting at sink Lower Body Bathing: Moderate assistance Where Assessed-Lower Body Bathing: Sitting at sink  Upper Body Dressing: Moderate assistance Where Assessed-Upper Body Dressing: Sitting at sink Lower Body Dressing: Maximal assistance Where Assessed-Lower Body Dressing: Sitting at sink Toileting: Moderate assistance Where Assessed-Toileting: Bedside Commode Toilet Transfer: Moderate assistance Toilet Transfer Method: Other (comment) Charlaine Dalton) Toilet Transfer Equipment: Bedside commode Mobility  Bed Mobility Bed Mobility: Rolling Right;Rolling Left;Right Sidelying to Sit;Sit to Sidelying Right Rolling Right: Minimal Assistance - Patient > 75% Rolling Left: Moderate Assistance - Patient 50-74% Right Sidelying to Sit: Moderate Assistance - Patient 50-74% Sit to Supine: Minimal Assistance - Patient > 75% Sit to Sidelying Right: Minimal Assistance - Patient > 75%   Discharge Criteria: Patient will be discharged from OT if patient refuses treatment 3 consecutive times without medical reason, if treatment goals not met, if there is a change in medical status, if patient makes no progress towards goals or if patient is discharged from hospital.  The above assessment, treatment plan, treatment alternatives and goals were discussed and mutually agreed upon: by patient  Philip Stone 11/18/2019, 1:00 PM

## 2019-11-18 NOTE — Plan of Care (Signed)
  Problem: RH Balance Goal: LTG Patient will maintain dynamic sitting balance (PT) Description: LTG:  Patient will maintain dynamic sitting balance with assistance during mobility activities (PT) Flowsheets (Taken 11/18/2019 1547) LTG: Pt will maintain dynamic sitting balance during mobility activities with:: Independent with assistive device  Goal: LTG Patient will maintain dynamic standing balance (PT) Description: LTG:  Patient will maintain dynamic standing balance with assistance during mobility activities (PT) Flowsheets (Taken 11/18/2019 1547) LTG: Pt will maintain dynamic standing balance during mobility activities with:: Supervision/Verbal cueing   Problem: Sit to Stand Goal: LTG:  Patient will perform sit to stand with assistance level (PT) Description: LTG:  Patient will perform sit to stand with assistance level (PT) Flowsheets (Taken 11/18/2019 1547) LTG: PT will perform sit to stand in preparation for functional mobility with assistance level: Supervision/Verbal cueing   Problem: RH Vision Goal: RH LTG Vision Theme park manager) Flowsheets (Taken 11/18/2019 1547) LTG: Vision Goals: Pt will attend to the R side with only 50% cues in functional tasks   Problem: RH Bed Mobility Goal: LTG Patient will perform bed mobility with assist (PT) Description: LTG: Patient will perform bed mobility with assistance, with/without cues (PT). Flowsheets (Taken 11/18/2019 1547) LTG: Pt will perform bed mobility with assistance level of: Supervision/Verbal cueing   Problem: RH Bed to Chair Transfers Goal: LTG Patient will perform bed/chair transfers w/assist (PT) Description: LTG: Patient will perform bed to chair transfers with assistance (PT). Flowsheets (Taken 11/18/2019 1547) LTG: Pt will perform Bed to Chair Transfers with assistance level: Supervision/Verbal cueing   Problem: RH Car Transfers Goal: LTG Patient will perform car transfers with assist (PT) Description: LTG: Patient will perform car  transfers with assistance (PT). Flowsheets (Taken 11/18/2019 1547) LTG: Pt will perform car transfers with assist:: Minimal Assistance - Patient > 75%   Problem: RH Ambulation Goal: LTG Patient will ambulate in controlled environment (PT) Description: LTG: Patient will ambulate in a controlled environment, # of feet with assistance (PT). Flowsheets (Taken 11/18/2019 1547) LTG: Pt will ambulate in controlled environ  assist needed:: Contact Guard/Touching assist LTG: Ambulation distance in controlled environment: 140ft with LRAD Goal: LTG Patient will ambulate in home environment (PT) Description: LTG: Patient will ambulate in home environment, # of feet with assistance (PT). Flowsheets (Taken 11/18/2019 1547) LTG: Pt will ambulate in home environ  assist needed:: Contact Guard/Touching assist LTG: Ambulation distance in home environment: 14ft with LRAD   Problem: RH Wheelchair Mobility Goal: LTG Patient will propel w/c in controlled environment (PT) Description: LTG: Patient will propel wheelchair in controlled environment, # of feet with assist (PT) Flowsheets (Taken 11/18/2019 1547) LTG: Pt will propel w/c in controlled environ  assist needed:: Supervision/Verbal cueing LTG: Propel w/c distance in controlled environment: 148ft   Problem: RH Stairs Goal: LTG Patient will ambulate up and down stairs w/assist (PT) Description: LTG: Patient will ambulate up and down # of stairs with assistance (PT) Flowsheets (Taken 11/18/2019 1547) LTG: Pt will ambulate up/down stairs assist needed:: Minimal Assistance - Patient > 75% LTG: Pt will  ambulate up and down number of stairs: 2 steps to enter house

## 2019-11-18 NOTE — Evaluation (Signed)
Speech Language Pathology Assessment and Plan  Patient Details  Name: Haydin Calandra MRN: [id number] Date of Birth: July 18, 1950  SLP Diagnosis: Aphasia;Cognitive Impairments;Speech and Language deficits  Rehab Potential: Good ELOS: 2.5-3 weeks    Today's Date: 11/18/2019 SLP Individual Time: 0800-0900 SLP Individual Time Calculation (min): 60 min   Hospital Problem: Principal Problem:   Left thalamic infarction [facility]  Past Medical History:  Past Medical History:  Diagnosis Date  . Arthritis   . BPH (benign prostatic hyperplasia)   . Charcot-Marie-Tooth disease   . Foot drop, right   . Neuropathy   . Platelets decreased ([place])   . Spinal stenosis    Past Surgical History:  Past Surgical History:  Procedure Laterality Date  . CERVICAL DISC SURGERY  10 YEARS AGO   reports there are plates and screws in place   . FOOT ARTHROPLASTY    . INGUINAL HERNIA REPAIR Right 06/15/2014   Procedure: RIGHT INGUINAL HERNIORRHAPHY  WITH MESH;  Surgeon: Aviva Signs Md, MD;  Location: [facility];  Service: General;  Laterality: Right;  . INSERTION OF MESH Right 06/15/2014   Procedure: INSERTION OF MESH RIGHT INGUINAL HERNIORRHAPHY;  Surgeon: Aviva Signs Md, MD;  Location: [facility];  Service: General;  Laterality: Right;  . KNEE ARTHROSCOPY Right   . TOTAL KNEE ARTHROPLASTY Right 01/21/2018   Procedure: TOTAL KNEE ARTHROPLASTY;  Surgeon: Sydnee Cabal, MD;  Location: [facility];  Service: Orthopedics;  Laterality: Right;    Assessment / Plan / Recommendation  Patient is a Demarcus Thielke is a 69 year old right-handed male with history significant for Charcot-Marie-Tooth disease, right foot drop with neuropathy, cervical stenosis with decompression and discectomy 10 years ago, right TKA 01/21/2018, quit smoking 13 years ago. History taken from chart review due to aphasia. Patient lives alone 1 level home 2 steps to entry independent with assistive device and still driving. He does have a girlfriend with good  support. He presented on 11/15/2019 to [facility] with right side hemiparesis and numbness x3 days as well as aphasia. Initial MRI/MRA showed no acute infarction hemorrhage or mass. Moderate chronic microvascular ischemic changes. 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 No associated mass-effect. MRA of the head interval occlusion of left PCA since previous exam. No other large vessel occlusion. MRA of the neck no more than mild short segment stenosis of the origin of the left ICA. Both vertebral arteries widely patent within the neck. Patient did not receive TPA. Echocardiogram with ejection fraction of 60 to 65%, no wall motion abnormalities. Grade 1 diastolic dysfunction. Admission chemistries were unremarkable, hemoglobin 15.2, platelets 126. Neurology follow-up Dr.[place] and maintained on Plavix for CVA prophylaxis. Subcutaneous Lovenox for DVT prophylaxis. Tolerating a regular diet. Noted on 11/17/2019 patient found sitting on the floor in front of his bed with soiled linens no apparent injury except for abrasion left knee. Therapy evaluations completed and patient was admitted for a comprehensive rehab program.  Patient transferred to CIR on 11/17/2019 .   Clinical Impression Patient presents with a severe expressive aphasia and a moderate receptive aphasia but with swallow function that is [facility] as per bedside swallow evaluation. Patient exhibited perseveration with naming, such as calling a shoe a "sewing needle" and then calling the next picture a "sewing gun". He was only able to name "glasses" object but no  pictures, photos or other objects. He reported age as 69, stated dcurrent month as "May" (May is his birth month) and for place, stated "Im in [place]" and when  asked where exactly he said, "Im at work". He was able to point to body parts on face but when question switched to another location, he then perseverated on pointing to his face. He was able to repeat phrases with 70% accuracy. He was 60% accurate pointing to object drawings, but when presented with other object pictures or real objects, he was less than 40% accurate. He was able to point to correct spelling of his name in field of two choices. He was able to answer biographical questions accurately with yes/no responses as well as responding to questions, such as "What is your girlfriend's name?" "What kind of work did you do?", etc. Patient described that he cannot see "on the right side" and that he has some trouble with food on left side of mouth. During swallow assessment, he did not present with any difficulty in mastication or swallowing of regular texture solids.  Skilled Therapeutic Interventions          Bedside swallow evaluation, speech-language, cognitive evaluation  SLP Assessment  Patient will need skilled Speech Lanaguage Pathology Services during CIR admission    Recommendations  SLP Diet Recommendations: Age appropriate regular solids;Thin Liquid Administration via: Cup;Straw Medication Administration: Whole meds with liquid Supervision: Patient able to self feed Postural Changes and/or Swallow Maneuvers: Seated upright 90 degrees Oral Care Recommendations: Oral care BID Recommendations for Other Services: Neuropsych consult Patient destination: Home Follow up Recommendations: Home Health SLP;Outpatient SLP;24 hour supervision/assistance Equipment Recommended: None recommended by SLP    SLP Frequency 3 to 5 out of 7 days   SLP Duration  SLP Intensity  SLP Treatment/Interventions 2.5-3 weeks  Minumum of 1-2 x/day, 30 to 90 minutes  Cognitive remediation/compensation;Multimodal communication approach;Speech/Language facilitation;Functional tasks;Cueing  hierarchy;Internal/external aids;Patient/family education    Pain Pain Assessment Pain Scale: Faces Pain Score: 0-No pain  Prior Functioning Cognitive/Linguistic Baseline: Within functional limits Type of Home: House  Lives With: Alone Available Help at Discharge: Available 24 hours/day Education: some high school 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")  SLP Evaluation Cognition Overall Cognitive Status: Impaired/Different from baseline Arousal/Alertness: Awake/alert Orientation Level: Oriented to person;Disoriented to place;Disoriented to time Attention: Sustained Sustained Attention: Impaired Memory: Impaired Memory Impairment: Decreased short term memory;Retrieval deficit;Decreased recall of new information Immediate Memory Recall: Sock;Blue;Bed Memory Recall Sock: Without Cue Memory Recall Blue: Without Cue Memory Recall Bed: Not able to recall Awareness: Impaired Safety/Judgment: Impaired  Comprehension Auditory Comprehension Overall Auditory Comprehension: Impaired Yes/No Questions: Within Functional Limits Commands: Impaired Two Step Basic Commands: 50-74% accurate EffectiveTechniques: Extra processing time;Visual/Gestural cues Visual Recognition/Discrimination Discrimination: Not tested Reading Comprehension Reading Status: Unable to assess (comment) (severe expressive aphasia) Expression Expression Primary Mode of Expression: Verbal Verbal Expression Overall Verbal Expression: Impaired Initiation: No impairment Automatic Speech: Name;Social Response Level of Generative/Spontaneous Verbalization: Phrase Repetition: Impaired Level of Impairment: Sentence level;Phrase level Naming: Impairment Responsive: 0-25% accurate Confrontation: Impaired Verbal Errors: Semantic paraphasias;Phonemic paraphasias;Not aware of errors;Perseveration Pragmatics: Impairment Impairments: Monotone;Abnormal affect Effective Techniques: Phonemic cues;Written cues Non-Verbal  Means of Communication: Not applicable Written Expression Dominant Hand: Right Written Expression: Unable to assess (comment) Oral Motor Oral Motor/Sensory Function Overall Oral Motor/Sensory Function: Mild impairment Facial ROM: Within Functional Limits Facial Symmetry: Within Functional Limits Facial Strength: Within Functional Limits Lingual ROM: Reduced right Lingual Symmetry: Abnormal symmetry left  Care Tool Care Tool Cognition Expression of  Ideas and Wants Expression of Ideas and Wants: Frequent difficulty - frequently exhibits difficulty with expressing needs and ideas   Understanding Verbal and Non-Verbal Content Understanding Verbal and Non-Verbal Content: Sometimes understands - understands only basic conversations or simple, direct phrases. Frequently requires cues to understand   Memory/Recall Ability *first 3 days only Memory/Recall Ability *first 3 days only: None of the above were recalled     PMSV Assessment  PMSV Trial    Bedside Swallowing Assessment General Date of Onset: 11/15/19 Previous Swallow Assessment: N/A Diet Prior to this Study: Regular;Thin liquids Temperature Spikes Noted: No Respiratory Status: Room air History of Recent Intubation: No Behavior/Cognition: Alert;Cooperative;Pleasant mood Oral Cavity - Dentition: Adequate natural dentition Self-Feeding Abilities: Able to feed self Patient Positioning: Upright in bed Baseline Vocal Quality: Normal Volitional Cough: Strong Volitional Swallow: Able to elicit  Oral Care Assessment   Ice Chips   Thin Liquid Presentation: Self Fed;Straw Nectar Thick   Honey Thick   Puree   Solid Solid: Within functional limits Presentation: Self Fed BSE Assessment Risk for Aspiration Impact on safety and function: No limitations Other Related Risk Factors: Cognitive impairment  Short Term Goals: Week 1: SLP Short Term Goal 1 (Week 1): Patient will point to object pictures in field of two with 75%  accuracy. SLP Short Term Goal 2 (Week 1): Patient will demonstrate orientation to place, month, year with mod A SLP Short Term Goal 3 (Week 1): Patient will follow two step directions with 80% accuracy and minA. SLP Short Term Goal 4 (Week 1): Patient will demonstrate intellectual awareness during basic functional ADL's and speech-language tasks with modA SLP Short Term Goal 5 (Week 1): Patient will communicate wants/needs at basic level verbally and/or with communication pictures, with modA  Refer to Care Plan for Long Term Goals  Recommendations for other services: Neuropsych  Discharge Criteria: Patient will be discharged from SLP if patient refuses treatment 3 consecutive times without medical reason, if treatment goals not met, if there is a change in medical status, if patient makes no progress towards goals or if patient is discharged from hospital.  The above assessment, treatment plan, treatment alternatives and goals were discussed and mutually agreed upon: No family available/patient unable  Sonia Baller, MA, CCC-SLP Speech Therapy

## 2019-11-19 MED ORDER — POLYETHYLENE GLYCOL 3350 17 G PO PACK: 17.0000 g | PACK | Freq: Every day | ORAL | Status: DC | PRN

## 2019-11-19 MED ORDER — SORBITOL 70 % SOLN
30.0000 mL | Freq: Every day | Status: DC | PRN
  Administered 2019-11-19: 30 mL via ORAL
  Filled 2019-11-19: qty 30

## 2019-11-19 NOTE — Progress Notes (Signed)
PHYSICAL MEDICINE & REHABILITATION PROGRESS NOTE   Subjective/Complaints:   Per nurse, no BM in 3 days (pt has aphasia and told me yesterday bowels OK).   Pt initially said "I need my meds changed"- finally figured out he meant I need to brush my teeth.      ROS: Limited by aphasia   Objective:   No results found. Recent Labs    11/17/19 1343  WBC 7.3  HGB 15.6  HCT 45.5  PLT 143*   Recent Labs    11/17/19 1343  CREATININE 1.06    Intake/Output Summary (Last 24 hours) at 11/19/2019 1348 Last data filed at 11/19/2019 1303 Gross per 24 hour  Intake 568 ml  Output 375 ml  Net 193 ml     Physical Exam: Vital Signs Blood pressure (!) 148/76, pulse 65, temperature 98.2 F (36.8 C), temperature source Oral, resp. rate 18, SpO2 97 %.  Physical Exam  Constitutional:  awake, alert, severe to moderate aphasia, sitting up in bed, NAD HENT: conjugate gaze  Cardiovascular: RRR Pulmonary: CTA B/L- no W/R/R- good air movement Abdominal: Soft, NT, slightly distended;, (+)BS -hypoactive- Musculoskeletal:     Cervical back: Normal range of motion and neck supple.     Comments: No edema or tenderness in extremities  Neurological:     Mental Status: He is alert.     Comments: AOx1-2 Moderate expressive aphasia LUE: 5/5 proximal distal LLE: Hip flexion, knee extension 4-4+/5, ankle dorsiflexion 1/5 RUE: Appears to be 4/5 proximal to distal, limited due to apraxia RLE: Hip flexion, knee extension 2+/5, ankle dorsiflexion 0/5  Psychiatric:  flat affect still  Assessment/Plan: 1. 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  Physiatrist is providing close team supervision and 24 hour management of active medical problems listed below.  Physiatrist and rehab team continue to  assess barriers to discharge/monitor patient progress toward functional and medical goals  Care Tool:  Bathing  Bathing activity did not occur:  (patient refused) Body parts bathed by patient: Right arm, Chest, Abdomen, Front perineal area, Right upper leg, Left upper leg, Face   Body parts bathed by helper: Left arm, Buttocks, Right lower leg, Left lower leg     Bathing assist Assist Level: Moderate Assistance - Patient 50 - 74%     Upper Body Dressing/Undressing Upper body dressing   What is the patient wearing?: Pull over shirt    Upper body assist Assist Level: Moderate Assistance - Patient 50 - 74%    Lower Body Dressing/Undressing Lower body dressing      What is the patient wearing?: Pants     Lower body assist Assist for lower body dressing: Maximal Assistance - Patient 25 - 49%     Toileting Toileting    Toileting assist Assist for toileting: Independent (patient used urinal)     Transfers Chair/bed transfer  Transfers assist     Chair/bed transfer assist level: Maximal Assistance - Patient 25 - 49%     Locomotion Ambulation   Ambulation assist      Assist level: Total Assistance - Patient < 25% Assistive device: Hand held assist Max distance: 3   Walk 10 feet activity   Assist     Assist level: Total Assistance - Patient < 25% Assistive device: Hand held assist   Walk 50  feet activity   Assist Walk 50 feet with 2 turns activity did not occur: Safety/medical concerns         Walk 150 feet activity   Assist Walk 150 feet activity did not occur: Safety/medical concerns         Walk 10 feet on uneven surface  activity   Assist Walk 10 feet on uneven surfaces activity did not occur: Safety/medical concerns         Wheelchair     Assist        Wheelchair assist level: Maximal Assistance - Patient 25 - 49% Max wheelchair distance: 150    Wheelchair 50 feet with 2 turns activity    Assist         Assist Level: Maximal Assistance - Patient 25 - 49%   Wheelchair 150 feet activity     Assist      Assist Level: Maximal Assistance - Patient 25 - 49%   Blood pressure (!) 148/76, pulse 65, temperature 98.2 F (36.8 C), temperature source Oral, resp. rate 18, SpO2 97 %.  Medical Problem List and Plan: 1.  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  Antithrombotics: -DVT/anticoagulation: Lovenox             -antiplatelet therapy: Plavix 75 mg daily 3. Pain Management: Tylenol as needed  10/9- no complaints of pain- con't tylenol prn 4. Mood: Provide emotional support             -antipsychotic agents: N/A 5. Neuropsych: This patient is capable of making decisions on his own behalf. 6. Skin/Wound Care: Routine skin checks 7. Fluids/Electrolytes/Nutrition: Routine in and outs.  CMP ordered. 8.  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  Follow-up outpatient 9.  Hyperlipidemia.  Lipitor 10.  BPH.  Resume tamsulosin  110/10- voiding better- con't Flomax 11.  Elevated blood pressure reading  10/9- 130s/80s- con't no meds  10/10- BP 148/76- this AM and last evening- if no better in 24 hours, suggest starting BP med.              Monitor with increased mobility 12. Constipation  10/10- give miralax prn for mild constipation and Sorbitol for moderate constipation-     LOS: 2 days A FACE TO FACE EVALUATION WAS PERFORMED  Philip Stone 11/19/2019, 1:48 PM

## 2019-11-20 ENCOUNTER — Inpatient Hospital Stay (HOSPITAL_COMMUNITY): Payer: PPO | Admitting: Occupational Therapy

## 2019-11-20 ENCOUNTER — Inpatient Hospital Stay (HOSPITAL_COMMUNITY): Payer: PPO | Admitting: Physical Therapy

## 2019-11-20 ENCOUNTER — Telehealth: Payer: Self-pay | Admitting: *Deleted

## 2019-11-20 LAB — CBC WITH DIFFERENTIAL/PLATELET
Abs Immature Granulocytes: 0.02 10*3/uL (ref 0.00–0.07)
Basophils Absolute: 0 10*3/uL (ref 0.0–0.1)
Basophils Relative: 1 %
Eosinophils Absolute: 0.1 10*3/uL (ref 0.0–0.5)
Eosinophils Relative: 1 %
HCT: 43.1 % (ref 39.0–52.0)
Hemoglobin: 14.4 g/dL (ref 13.0–17.0)
Immature Granulocytes: 0 %
Lymphocytes Relative: 8 %
Lymphs Abs: 0.7 10*3/uL (ref 0.7–4.0)
MCH: 28.9 pg (ref 26.0–34.0)
MCHC: 33.4 g/dL (ref 30.0–36.0)
MCV: 86.4 fL (ref 80.0–100.0)
Monocytes Absolute: 1 10*3/uL (ref 0.1–1.0)
Monocytes Relative: 12 %
Neutro Abs: 6.5 10*3/uL (ref 1.7–7.7)
Neutrophils Relative %: 78 %
Platelets: 110 10*3/uL — ABNORMAL LOW (ref 150–400)
RBC: 4.99 MIL/uL (ref 4.22–5.81)
RDW: 13.2 % (ref 11.5–15.5)
WBC: 8.3 10*3/uL (ref 4.0–10.5)
nRBC: 0 % (ref 0.0–0.2)

## 2019-11-20 LAB — COMPREHENSIVE METABOLIC PANEL
ALT: 33 U/L (ref 0–44)
AST: 32 U/L (ref 15–41)
Albumin: 3.7 g/dL (ref 3.5–5.0)
Alkaline Phosphatase: 82 U/L (ref 38–126)
Anion gap: 11 (ref 5–15)
BUN: 19 mg/dL (ref 8–23)
CO2: 21 mmol/L — ABNORMAL LOW (ref 22–32)
Calcium: 9 mg/dL (ref 8.9–10.3)
Chloride: 107 mmol/L (ref 98–111)
Creatinine, Ser: 1.18 mg/dL (ref 0.61–1.24)
GFR, Estimated: >60 mL/min (ref >60)
Glucose, Bld: 102 mg/dL — ABNORMAL HIGH (ref 70–99)
Potassium: 4 mmol/L (ref 3.5–5.1)
Sodium: 139 mmol/L (ref 135–145)
Total Bilirubin: 1.2 mg/dL (ref 0.3–1.2)
Total Protein: 6.7 g/dL (ref 6.5–8.1)

## 2019-11-20 NOTE — Progress Notes (Signed)
Occupational Therapy Session Note  Patient Details  Name: Philip Stone MRN: [id number] Date of Birth: 02/23/1950  Today's Date: 11/20/2019 OT Individual Time: 0945-1100 OT Individual Time Calculation (min): 75 min    Short Term Goals: Week 1:  OT Short Term Goal 1 (Week 1): Patient will complete functional transfers with Mod A and LRAD. OT Short Term Goal 2 (Week 1): Patient will don UB clothing with Min A and use of hemi technique. OT Short Term Goal 3 (Week 1): Patient will recall and demonstrate 3 visual compensatory strategies in prep for ADLs. OT Short Term Goal 4 (Week 1): Patient will thread BLE through LB clothing seated EOB.  Skilled Therapeutic Interventions/Progress Updates:  Patient seated in wc in agreement with OT treatment session. 0/10 pain at rest and with activity. Seated at sink level, patient able to complete oral hygiene and face washing with Min A and maximal cueing for sequencing. Patient demonstrates difficulty with naming familiar ADL items despite cueing 2/2 aphasia. Total A for wc transport to therapy gym. Blocked practice for sit to stands in [place] with Mod A progressing to Min A with hand over hand to maintain position of R hand on bar 2/2 R inattention. Seated in wc, patient engaged in peg board activity with focus on compensatory vision strategies. Patient placed pegs on L side of board only with cueing for use of lighthouse technique to fill in R side of board. Session concluded with patient seated in wc with call bell within reach and all needs met.   Therapy Documentation Precautions:  Precautions Precautions: Fall Required Braces or Orthoses: Other Brace Other Brace: bilateral AFO's in shoes Restrictions Weight Bearing Restrictions: No General:    Therapy/Group: Individual Therapy  Destanae R Howerton-Davis 11/20/2019, 7:32 AM  

## 2019-11-20 NOTE — Progress Notes (Signed)
Inpatient Rehabilitation  Patient information reviewed and entered into eRehab system by Romon Devereux M. Lene Mckay, M.A., CCC/SLP, PPS Coordinator.  Information including medical coding, functional ability and quality indicators will be reviewed and updated through discharge.    

## 2019-11-20 NOTE — Progress Notes (Signed)
[facility] PHYSICAL MEDICINE & REHABILITATION PROGRESS NOTE   Subjective/Complaints:    aphasia with word substitution, States he has pain on RIght side but OT reports no pain with therapy, pafter further clarification with pt , he is c/o weakness rather than pain  ROS: Limited by aphasia   Objective:   No results found. Recent Labs    11/17/19 1343 11/20/19 0550  WBC 7.3 8.3  HGB 15.6 14.4  HCT 45.5 43.1  PLT 143* 110*   Recent Labs    11/17/19 1343 11/20/19 0550  NA  --  139  K  --  4.0  CL  --  107  CO2  --  21*  GLUCOSE  --  102*  BUN  --  19  CREATININE 1.06 1.18  CALCIUM  --  9.0    Intake/Output Summary (Last 24 hours) at 11/20/2019 1057 Last data filed at 11/20/2019 0831 Gross per 24 hour  Intake 390 ml  Output 204 ml  Net 186 ml     Physical Exam: Vital Signs Blood pressure 138/88, pulse 99, temperature 98 F (36.7 C), resp. rate 18, SpO2 97 %.  Physical Exam   General: No acute distress Mood and affect are appropriate Heart: Regular rate and rhythm no rubs murmurs or extra sounds Lungs: Clear to auscultation, breathing unlabored, no rales or wheezes Abdomen: Positive bowel sounds, soft nontender to palpation, nondistended Extremities: No clubbing, cyanosis, or edema Skin: No evidence of breakdown, no evidence of rash   Neurological:     Mental Status: He is alert.     Comments: AOx1-2 Moderate expressive aphasia LUE: 5/5 proximal distal LLE: Hip flexion, knee extension 4-4+/5, ankle dorsiflexion 1/5 RUE: Appears to be 4/5 proximal to distal, limited due to apraxia RLE: Hip flexion, knee extension 2+/5, ankle dorsiflexion 0/5  Psychiatric:  flat affect still  Assessment/Plan: 1. 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  Physiatrist is providing  close team supervision and 24 hour management of active medical problems listed below.  Physiatrist and rehab team continue to assess barriers to discharge/monitor patient progress toward functional and medical goals  Care Tool:  Bathing  Bathing activity did not occur:  (patient refused) Body parts bathed by patient: Right arm, Chest, Abdomen, Front perineal area, Right upper leg, Left upper leg, Face   Body parts bathed by helper: Left arm, Buttocks, Right lower leg, Left lower leg     Bathing assist Assist Level: Moderate Assistance - Patient 50 - 74%     Upper Body Dressing/Undressing Upper body dressing   What is the patient wearing?: Pull over shirt    Upper body assist Assist Level: Moderate Assistance - Patient 50 - 74%    Lower Body Dressing/Undressing Lower body dressing      What is the patient wearing?: Incontinence brief     Lower body assist Assist for lower body dressing: Maximal Assistance - Patient 25 - 49%     Toileting Toileting    Toileting assist Assist for toileting: Moderate Assistance - Patient 50 - 74%     Transfers Chair/bed transfer  Transfers assist     Chair/bed transfer assist level: Maximal Assistance - Patient 25 - 49%     Locomotion Ambulation   Ambulation assist      Assist level: Total Assistance - Patient <  25% Assistive device: Hand held assist Max distance: 3   Walk 10 feet activity   Assist     Assist level: Total Assistance - Patient < 25% Assistive device: Hand held assist   Walk 50 feet activity   Assist Walk 50 feet with 2 turns activity did not occur: Safety/medical concerns         Walk 150 feet activity   Assist Walk 150 feet activity did not occur: Safety/medical concerns         Walk 10 feet on uneven surface  activity   Assist Walk 10 feet on uneven surfaces activity did not occur: Safety/medical concerns         Wheelchair     Assist Will patient use wheelchair at  discharge?: Yes (Per PT long term goals) Type of Wheelchair: Manual    Wheelchair assist level: Maximal Assistance - Patient 25 - 49% Max wheelchair distance: 150    Wheelchair 50 feet with 2 turns activity    Assist        Assist Level: Maximal Assistance - Patient 25 - 49%   Wheelchair 150 feet activity     Assist      Assist Level: Maximal Assistance - Patient 25 - 49%   Blood pressure 138/88, pulse 99, temperature 98 F (36.7 C), resp. rate 18, SpO2 97 %.  Medical Problem List and Plan: 1.  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  Antithrombotics: -DVT/anticoagulation: Lovenox             -antiplatelet therapy: Plavix 75 mg daily 3. Pain Management: Tylenol as needed  10/9- no complaints of pain- con't tylenol prn 4. Mood: Provide emotional support             -antipsychotic agents: N/A 5. Neuropsych: This patient is capable of making decisions on his own behalf.with assistance for aphasia  6. Skin/Wound Care: Routine skin checks 7. Fluids/Electrolytes/Nutrition: Routine in and outs.  CMP ordered. 8.  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  Follow-up outpatient 9.  Hyperlipidemia.  Lipitor 10.  BPH.  Resume tamsulosin  110/10- voiding better- con't Flomax 11.  Elevated blood pressure reading- monitor for now    Vitals:   11/19/19 1936 11/20/19 0530  BP: (!) 142/77 138/88  Pulse: 67 99  Resp: 18 18  Temp: 98.4 F (36.9 C) 98 F (36.7 C)  SpO2: 96% 97%   12. Constipation  10/10- give miralax prn for mild constipation and Sorbitol for moderate constipation-     LOS: 3 days A FACE  TO FACE EVALUATION WAS PERFORMED  Charlett Blake 11/20/2019, 10:57 AM

## 2019-11-20 NOTE — Progress Notes (Signed)
[facility] Individual Statement of Services  Patient Name:  Philip Stone  Date:  11/20/2019  Welcome to the [facility].  Our goal is to provide you with an individualized program based on your diagnosis and situation, designed to meet your specific needs.  With this comprehensive rehabilitation program, you will be expected to participate in at least 3 hours of rehabilitation therapies Monday-Friday, with modified therapy programming on the weekends.  Your rehabilitation program will include the following services:  Physical Therapy (PT), Occupational Therapy (OT), Speech Therapy (ST), 24 hour per day rehabilitation nursing, Therapeutic Recreaction (TR), Neuropsychology, Care Coordinator, Rehabilitation Medicine, Nutrition Services, Pharmacy Services and Other  Weekly team conferences will be held on Wednesdays to discuss your progress.  Your Inpatient Rehabilitation Care Coordinator will talk with you frequently to get your input and to update you on team discussions.  Team conferences with you and your family in attendance may also be held.  Expected length of stay: 2-3 Weeks  Overall anticipated outcome: Supervision  Depending on your progress and recovery, your program may change. Your Inpatient Rehabilitation Care Coordinator will coordinate services and will keep you informed of any changes. Your Inpatient Rehabilitation Care Coordinator's name and contact numbers are listed  below.  The following services may also be recommended but are not provided by the [facility]:    [place] will be made to provide these services after discharge if needed.  Arrangements include referral to agencies that provide these services.  Your insurance has been verified to be:  [employer]  Your primary doctor is:  Claretta Fraise, MD  Pertinent  information will be shared with your doctor and your insurance company.  Inpatient Rehabilitation Care Coordinator:  Erlene Quan, [place] or [phone]  Information discussed with and copy given to patient by: Dyanne Iha, 11/20/2019, 10:20 AM

## 2019-11-20 NOTE — Telephone Encounter (Signed)
Melanie from [facility] called to inform us that pt is in the hospital.  He had a stroke.  He is scheduled for a procedure on 11/27/2019.  Would you like me to cancel procedure and if so, how long do you recommend to wait?

## 2019-11-20 NOTE — Telephone Encounter (Signed)
Lets cancel for now. Please have him see me or one of the APPs in 3-4 months in clinic and we can discuss at that time. Thanks!

## 2019-11-20 NOTE — Progress Notes (Signed)
   Patient Details  Name: Philip Stone MRN: [id number] Date of Birth: 1950-06-26  Today's Date: 11/20/2019  Hospital Problems: Principal Problem:   Left thalamic infarction [facility])  Past Medical History:  Past Medical History:  Diagnosis Date  . Arthritis   . BPH (benign prostatic hyperplasia)   . Charcot-Marie-Tooth disease   . Foot drop, right   . Neuropathy   . Platelets decreased ([place])   . Spinal stenosis    Past Surgical History:  Past Surgical History:  Procedure Laterality Date  . CERVICAL DISC SURGERY  10 YEARS AGO   reports there are plates and screws in place   . FOOT ARTHROPLASTY    . INGUINAL HERNIA REPAIR Right 06/15/2014   Procedure: RIGHT INGUINAL HERNIORRHAPHY  WITH MESH;  Surgeon: Aviva Signs Md, MD;  Location: [facility];  Service: General;  Laterality: Right;  . INSERTION OF MESH Right 06/15/2014   Procedure: INSERTION OF MESH RIGHT INGUINAL HERNIORRHAPHY;  Surgeon: Aviva Signs Md, MD;  Location: [facility];  Service: General;  Laterality: Right;  . KNEE ARTHROSCOPY Right   . TOTAL KNEE ARTHROPLASTY Right 01/21/2018   Procedure: TOTAL KNEE ARTHROPLASTY;  Surgeon: Sydnee Cabal, MD;  Location: [facility];  Service: Orthopedics;  Laterality: Right;   Social History:  reports that he quit smoking about 13 years ago. His smoking use included cigarettes. He started smoking about 30 years ago. He has a 18.00 pack-year smoking history. He has never used smokeless tobacco. He reports that he does not drink alcohol and does not use drugs.  Family / Support Systems Marital Status: Single Spouse/Significant Other: [name] (Girlfriend) Other Supports: Nadara Mustard (Brother), Sister Anticipated Caregiver: [name] Ability/Limitations of Caregiver: none Caregiver Availability: 24/7  Social History Preferred language: English Religion: Quaker Read: Yes Write: Yes   Abuse/Neglect Abuse/Neglect Assessment Can Be Completed: Yes Physical Abuse: Denies Verbal Abuse: Denies Sexual Abuse:  Denies Exploitation of patient/patient's resources: Denies Self-Neglect: Denies  Emotional Status Pt's affect, behavior and adjustment status: no Recent Psychosocial Issues: no Psychiatric History: no Substance Abuse History: no  Patient / Family Perceptions, Expectations & Goals Pt/Family understanding of illness & functional limitations: [name] is updated thus far, allowing sister to visit this this Premorbid pt/family roles/activities: Independent Anticipated changes in roles/activities/participation: [name] able to assist 24/7 Pt/family expectations/goals: Goal to d/c supervision level  [employer] Premorbid Home Care/DME Agencies: Other (Comment) (Brace (Bilateral Lower Leg), Rolling Walker, Cane, Bedside Commode, Civil engineer, contracting) Transportation available at discharge: Girlfriend able to [employer] referrals recommended: Neuropsychology  Discharge Planning Living Arrangements: Alone Support Systems: Friends/neighbors Type of Residence: Private residence (1 Level home, 2 steps to enter) Administrator, sports: Multimedia programmer (specify) (HTA) Financial Resources: Radio broadcast assistant Screen Referred: No Living Expenses: Education officer, community Management: Patient Does the patient have any problems obtaining your medications?: No Home Management: Independent Patient/Family Preliminary Plans: Girlfrien able to assist Care Coordinator Barriers to Discharge: Home environment access/layout Care Coordinator Barriers to Discharge Comments: 2 Steps to enter Care Coordinator Anticipated Follow Up Needs: HH/OP Expected length of stay: 2-3 weeks  Clinical Impression SW entered room, patient stated "he needs to use bathroom". SW introduced self and explained role. Reported same information to patients sister. Will continue to follow up for questions, updates and concerns.   Dyanne Iha 11/20/2019, 12:52 PM

## 2019-11-20 NOTE — Progress Notes (Signed)
Occupational Therapy Session Note  Patient Details  Name: Philip Stone MRN: [id number] Date of Birth: 1951/01/05  Today's Date: 11/20/2019 OT Individual Time: 8329-1916 OT Individual Time Calculation (min): 70 min    Short Term Goals: Week 1:  OT Short Term Goal 1 (Week 1): Patient will complete functional transfers with Mod A and LRAD. OT Short Term Goal 2 (Week 1): Patient will don UB clothing with Min A and use of hemi technique. OT Short Term Goal 3 (Week 1): Patient will recall and demonstrate 3 visual compensatory strategies in prep for ADLs. OT Short Term Goal 4 (Week 1): Patient will thread BLE through LB clothing seated EOB.  Skilled Therapeutic Interventions/Progress Updates:    Patient seated in w/c, he denies pain.  Expressive language impairment noted.  He is pleasant and cooperative, significant spatial impairment and right side inattention/visual impairment.  Sit to stand from w/c with mod A, unable to safely complete SPT, LOB and pushing to right at this time.  Returned to sitting and completed w/c level trunk and LB activities to promote midline and inhibit pushing.  Sit pivot transfer to right after blocking pushing with min/mod A.   He is able to tolerate unsupported sitting with CS for trunk, right UE NMRE with focus on proximal stability, motor control and posture.  Sit to supine with mod A.  Completed supine motor control activities returned to w/c sit pivot to left with mod A.   Completed visual motor and scanning activities - needs cues to move eyes to the right - eyes symmetrical and with full ROM.  Completed dynavision scanning activity seated (bottom half of screen)  Trial 1:  6.67 sec (L = 4.55 s, R = 7.75 s) Trial 2:  10.00 sec ( L = 3.5 s, R = 12.97 s) Trial 3:  8.57 sec (L = 2.79 s, R = 15.39 s) Provided lap tray for right UE support and midline orientation, reviewed visual motor activities in room and right UE AROM activities.  He remained seated in w.c at close  of session, seat belt alarm set and call bell in reach.    Therapy Documentation Precautions:  Precautions Precautions: Fall Required Braces or Orthoses: Other Brace Other Brace: bilateral AFO's in shoes Restrictions Weight Bearing Restrictions: No   Therapy/Group: Individual Therapy  Carlos Levering 11/20/2019, 3:28 PM

## 2019-11-20 NOTE — Progress Notes (Signed)
Physical Therapy Session Note  Patient Details  Name: Philip Stone MRN: [id number] Date of Birth: 1951-02-05  Today's Date: 11/20/2019 PT Individual Time: 0805-0900 PT Individual Time Calculation (min): 55 min   Short Term Goals: Week 1:  PT Short Term Goal 1 (Week 1): Pt will propell WC 182f with min assist and hemi technique PT Short Term Goal 2 (Week 1): Pt will perform bed mobility with min assist PT Short Term Goal 3 (Week 1): [facility] PT Short Term Goal 4 (Week 1): Pt will ambulate 326fwith mod assist  Skilled Therapeutic Interventions/Progress Updates: Pt presented in bed agreeable to therapy. Pt denies pain during session. PTA threaded pants maxA while in supine and pt performed bridges to pull pants over hips. Performed supine to sit EOB with minA and verbal cues for sequencing. PTA donned shoes total A for time management and performed squat pivot transfer to w/c with modA and max multimodal cues for sequencing. Pt then transported to rehab gym and performed squat pivot transfer in same manner to mat. Pt participate din STS x 5 without AD and attempted to use mirror for feedback. Pt required modA for STS with PTA blocking RLE to prevent knee buckling. Pt required max mulitmodal cues for sequencing with each stand however was able to maintain fair static standing balance once upright. Pt was then introduced to standing with RW to initiate standing balance and reaching tasks. PTA provided R hand orthotic to RW and attempted to have pt reach and place horseshoes on basketball net. When introduced with additional task pt began to have noticeable R lean. Pt was then re-introduced to task in sitting with improved ability to follow cues (still initially required max cues for sequencing but improved to min) as well as was able to scan to R with mod cues progressing to min. Pt performed squat pivot to L to return to w/c modA and initiated w/c mobility. Pt instructed in  w/c propulsion via hemi-technique requiring minA progressing to supervision with cues for visual aide for approx [id number] transported remaining distance back to room and pt agreeable to remain in w/c at end of session. Pt left with belt alarm on, call bell within reach and needs met.      Therapy Documentation Precautions:  Precautions Precautions: Fall Required Braces or Orthoses: Other Brace Other Brace: bilateral AFO's in shoes Restrictions Weight Bearing Restrictions: No General:   Vital Signs: Therapy Vitals Temp: 98.2 F (36.8 C) Pulse Rate: 84 Resp: 16 BP: 124/76 Patient Position (if appropriate): Sitting Oxygen Therapy SpO2: 98 % O2 Device: Room Air Pain:   Mobility:   Locomotion :    Trunk/Postural Assessment :    Balance:   Exercises:   Other Treatments:      Therapy/Group: Individual Therapy  Jeancarlos Marchena 11/20/2019, 4:22 PM

## 2019-11-20 NOTE — IPOC Note (Signed)
Overall Plan of Care [facility]) Patient Details Name: Philip Stone MRN: [id number] DOB: December 12, 1950  Admitting Diagnosis: Left thalamic infarction [facility])  Hospital Problems: Principal Problem:   Left thalamic infarction [facility])     Functional Problem List: Nursing Bladder, Bowel, Medication Management, Sensory, Safety, Edema  PT Balance, Behavior, Endurance, Motor, Pain, Perception, Sensory, Safety, Skin Integrity  OT Balance, Cognition, Motor, Perception, Safety, Vision  SLP Cognition, Linguistic, Safety  TR         Basic ADL's: OT Eating, Grooming, Bathing, Dressing, Toileting     Advanced  ADL's: OT       Transfers: PT Bed Mobility, Bed to Chair, Car, Furniture, Floor  OT Toilet, Tub/Shower     Locomotion: PT Ambulation, Emergency planning/management officer, Stairs     Additional Impairments: OT Fuctional Use of Upper Extremity  SLP Social Cognition   Social Interaction, Problem Solving, Awareness  TR      Anticipated Outcomes Item Anticipated Outcome  Self Feeding Independent  Swallowing      Basic self-care  Mod I to supervision A  Toileting  Supervision A   Bathroom Transfers Supervision A  Bowel/Bladder  Manage bowel and bladder with min assist  Transfers  Supervision assit transfers  Locomotion  CGA ambulatory with LRAD athouse hold level  Communication  mod A expressive, minA basic receptive  Cognition  supervision basic, minA mild complex  Pain  manage pain at or below level 4  Safety/Judgment  Maintain safety with cues/reminders   Therapy Plan: PT Intensity: Minimum of 1-2 x/day ,45 to 90 minutes PT Frequency: 5 out of 7 days PT Duration Estimated Length of Stay: 21-24 days OT Intensity: Minimum of 1-2 x/day, 45 to 90 minutes OT Frequency: 5 out of 7 days OT Duration/Estimated Length of Stay: 2.5-3 weeks SLP Intensity: Minumum of 1-2 x/day, 30 to 90 minutes SLP Frequency: 3 to 5 out of 7 days SLP Duration/Estimated Length of Stay: 2.5-3 weeks   Due to  the current state of emergency, patients may not be receiving their 3-hours of Medicare-mandated therapy.   Team Interventions: Nursing Interventions Patient/Family Education, Bladder Management, Bowel Management, Disease Management/Prevention, Pain Management, Medication Management, Discharge Planning  PT interventions Discharge planning, Ambulation/gait training, Balance/vestibular training, Cognitive remediation/compensation, Community reintegration, Functional electrical stimulation, DME/adaptive equipment instruction, Disease management/prevention, Functional mobility training, Psychosocial support, Visual/perceptual remediation/compensation, Therapeutic Activities, Neuromuscular re-education, Skin care/wound management, Therapeutic Exercise, Wheelchair propulsion/positioning, Pain management, Splinting/orthotics, UE/LE Strength taining/ROM, Patient/family education, Stair training, UE/LE Coordination activities  OT Interventions Training and development officer, Cognitive remediation/compensation, Community reintegration, Discharge planning, DME/adaptive equipment instruction, Functional electrical stimulation, Functional mobility training, Neuromuscular re-education, Patient/family education, Self Care/advanced ADL retraining, Therapeutic Activities, Therapeutic Exercise, UE/LE Strength taining/ROM, UE/LE Coordination activities, Visual/perceptual remediation/compensation  SLP Interventions Cognitive remediation/compensation, Multimodal communication approach, Speech/Language facilitation, Functional tasks, Cueing hierarchy, Internal/external aids, Patient/family education  TR Interventions    SW/CM Interventions Discharge Planning, Psychosocial Support, Patient/Family Education   Barriers to Discharge MD  Medical stability and pre 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  Nursing      PT Inaccessible home environment, Decreased caregiver support, Home environment Child psychotherapist, Insurance for SNF  coverage    OT Inaccessible home environment, Decreased caregiver support    SLP Decreased caregiver support Patient stated that is girlfriend is retired but does not live with him  SW Home environment access/layout 2 Steps to enter   Team Discharge Planning: Destination: PT-Home ,OT- Home , SLP-Home Projected Follow-up: PT-Home health PT, OT-  24 hour supervision/assistance, SLP-Home Health SLP, Outpatient SLP,  24 hour supervision/assistance Projected Equipment Needs: PT-Rolling walker with 5" wheels, Wheelchair (measurements), Wheelchair cushion (measurements), OT- To be determined, SLP-None recommended by SLP Equipment Details: PT- , OT-  Patient/family involved in discharge planning: PT- Patient,  OT-Patient, SLP-Patient unable/family or caregive not available  MD ELOS: 14-16d Medical Rehab Prognosis:  Good Assessment:  69 year old right-handed male with history significant for Charcot-Marie-Tooth disease, right foot drop with neuropathy, cervical stenosis with decompression and discectomy 10 years ago, right TKA 01/21/2018, quit smoking 13 years ago.  History taken from chart review due to aphasia.  Patient lives alone 1 level home 2 steps to entry independent with assistive device and still driving.  He does have a girlfriend with good support.  He presented on 11/15/2019 to [facility] with right side hemiparesis and numbness x3 days as well as aphasia.  Initial MRI/MRA showed no acute infarction hemorrhage or mass.  Moderate chronic microvascular ischemic changes.  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  No associated mass-effect.  MRA of the head interval occlusion of left PCA since previous exam.  No other large vessel occlusion.  MRA of the neck no more than mild short  segment stenosis of the origin of the left ICA.  Both vertebral arteries widely patent within the neck.  Patient did not receive TPA.  Echocardiogram with ejection fraction of 60 to 65%, no wall motion abnormalities.  Grade 1 diastolic dysfunction. Admission chemistries were unremarkable, hemoglobin 15.2, platelets 126.  Neurology follow-up Dr.[place] and maintained on Plavix for CVA prophylaxis.  Subcutaneous Lovenox for DVT prophylaxis.  Tolerating a regular diet.  Noted on 11/17/2019 patient found sitting on the floor in front of his bed with soiled linens no apparent injury except for abrasion left knee.  Therapy evaluations completed and patient was admitted for a comprehensive rehab program.  Please see preadmission assessment from earlier today as well.    Now requiring 24/7 Rehab RN,MD, as well as CIR level PT, OT and SLP.  Treatment team will focus on ADLs and mobility with goals set at [place] See Team Conference Notes for weekly updates to the plan of care

## 2019-11-21 ENCOUNTER — Inpatient Hospital Stay (HOSPITAL_COMMUNITY): Payer: PPO | Admitting: Physical Therapy

## 2019-11-21 ENCOUNTER — Inpatient Hospital Stay (HOSPITAL_COMMUNITY): Payer: PPO | Admitting: Occupational Therapy

## 2019-11-21 ENCOUNTER — Inpatient Hospital Stay (HOSPITAL_COMMUNITY): Payer: PPO | Admitting: Speech Pathology

## 2019-11-21 DIAGNOSIS — R482 Apraxia: Secondary | ICD-10-CM

## 2019-11-21 DIAGNOSIS — G8191 Hemiplegia, unspecified affecting right dominant side: Secondary | ICD-10-CM

## 2019-11-21 DIAGNOSIS — K5901 Slow transit constipation: Secondary | ICD-10-CM

## 2019-11-21 DIAGNOSIS — R338 Other retention of urine: Secondary | ICD-10-CM

## 2019-11-21 DIAGNOSIS — N401 Enlarged prostate with lower urinary tract symptoms: Secondary | ICD-10-CM

## 2019-11-21 DIAGNOSIS — D696 Thrombocytopenia, unspecified: Secondary | ICD-10-CM

## 2019-11-21 NOTE — Plan of Care (Signed)
  Problem: Consults Goal: RH STROKE PATIENT EDUCATION Description: See Patient Education module for education specifics  Outcome: Progressing   Problem: RH BOWEL ELIMINATION Goal: RH STG MANAGE BOWEL WITH ASSISTANCE Description: STG Manage Bowel with min Assistance. Outcome: Progressing   Problem: RH BLADDER ELIMINATION Goal: RH STG MANAGE BLADDER WITH ASSISTANCE Description: STG Manage Bladder With min Assistance Outcome: Progressing   Problem: RH SKIN INTEGRITY Goal: RH STG SKIN FREE OF INFECTION/BREAKDOWN Description: Skin will be free of infection/breakdown with min assist Outcome: Progressing Goal: RH STG MAINTAIN SKIN INTEGRITY WITH ASSISTANCE Description: STG Maintain Skin Integrity With min Assistance. Outcome: Progressing   Problem: RH SAFETY Goal: RH STG ADHERE TO SAFETY PRECAUTIONS W/ASSISTANCE/DEVICE Description: STG Adhere to Safety Precautions With min Assistance/Device. Outcome: Progressing   Problem: RH PAIN MANAGEMENT Goal: RH STG PAIN MANAGED AT OR BELOW PT'S PAIN GOAL Description: Pain will be managed at or below 3 out of 10 on pain with min assist Outcome: Progressing   Problem: RH KNOWLEDGE DEFICIT Goal: RH STG INCREASE KNOWLEDGE OF STROKE PROPHYLAXIS Description: Pt will be able to verbalize stoke prophylaxis from handouts provided with medications and indicators with min assist Outcome: Progressing   Problem: RH Vision Goal: RH LTG Vision (Specify) Outcome: Progressing   

## 2019-11-21 NOTE — Telephone Encounter (Signed)
Lmom for pt to call me back. 

## 2019-11-21 NOTE — Progress Notes (Signed)
Speech Language Pathology Daily Session Note  Patient Details  Name: Philip Stone MRN: [id number] Date of Birth: April 12, 1950  Today's Date: 11/21/2019 SLP Individual Time: 8889-1694 SLP Individual Time Calculation (min): 55 min  Short Term Goals: Week 1: SLP Short Term Goal 1 (Week 1): Patient will point to object pictures in field of two with 75% accuracy. SLP Short Term Goal 2 (Week 1): Patient will demonstrate orientation to place, month, year with mod A SLP Short Term Goal 3 (Week 1): Patient will follow two step directions with 80% accuracy and minA. SLP Short Term Goal 4 (Week 1): Patient will demonstrate intellectual awareness during basic functional ADL's and speech-language tasks with modA SLP Short Term Goal 5 (Week 1): Patient will communicate wants/needs at basic level verbally and/or with communication pictures, with modA  Skilled Therapeutic Interventions: Pt was seen for skilled ST targeting expressive and receptive language skills. Pt able to engage in simple conversation at phrase level and communicate basic wants and needs upon SLP's arrival. He also participated in automatic speech task such as counting 1-20, days of the week, and months of the year from 90-100% accuracy with no more than Min A verbal cues. Pt is unable to read at word level without total assist, although able to repeat words and phrases. He identified objects by pointing (when given object name by SLP with 60% accuracy. During structured tasks with medium to high demand on expressive language abilities, pt's speech is fluent but full of word substitutions. Max A verbal cues to use strategies to slow down and reduce verbal errors, as well as phonemic cues provided during basic phrase completion tasks. Pt left sitting in chair with seatbelt alarm set and needs within reach. Continue per current plan of care.          Pain Pain Assessment Pain Scale: 0-10 Pain Score: 0-No pain  Therapy/Group: Individual  Therapy  Arbutus Leas 11/21/2019, 3:04 PM

## 2019-11-21 NOTE — Telephone Encounter (Signed)
Cancelled pt off schedule and Covid screening.  Informed Philip Stone in Endo by voice mail.  Waiting on call back from pt to inform him.  He is currently in hospital.

## 2019-11-21 NOTE — Progress Notes (Signed)
Occupational Therapy Session Note  Patient Details  Name: Philip Stone MRN: [id number] Date of Birth: 1950/12/01  Today's Date: 11/21/2019 OT Individual Time:1000-1045; and 1540-1610 OT Individual Time Calculation (min): 45 min; and 30 min    Short Term Goals: Week 1:  OT Short Term Goal 1 (Week 1): Patient will complete functional transfers with Mod A and LRAD. OT Short Term Goal 2 (Week 1): Patient will don UB clothing with Min A and use of hemi technique. OT Short Term Goal 3 (Week 1): Patient will recall and demonstrate 3 visual compensatory strategies in prep for ADLs. OT Short Term Goal 4 (Week 1): Patient will thread BLE through LB clothing seated EOB.  Skilled Therapeutic Interventions/Progress Updates:    First session: Pt sitting up in w/c, flat affect but agreeable to OT session.  Pt only c/o pain during session was tenderness in bilateral feet during bathing.  Pt completed sinkside bathing, dressing, and oral hygiene primarily in sitting with brief standing.  Pt needed mod assist for UB/LB bathing and UB dressing, and max to total assist for LB dressing and donning/doffing of footwear.  Pt requiring frequent intermittent multimodal cues to attend to right arm and environment on right side.  Pt required mod assist sit<>stand at sink and CGA for static standing with LUE support.  Pt cue'd to use mirror for visual feedback to self right to midline due to mild right sided leaning.  Pt able to self correct using mirror and VCs.  Pt brushed teeth needing hand over hand TCs and verbal cues to hold toothpaste in right hand and unscrew top with left.  Pt applied toothpaste using left hand.  Pt able to sequence remainder of oral hygiene without cueing or assistance.  Pt seated in w/c, call bell in reach, seat belt alarm on .  Second session:  Pt sitting up in w/c, no c/o pain.  Transported pt to dayroom via w/c where pt participated in tabletop and seated visual scanning and right attention  activities.  Pt able to grasp clothespins on left and scan to right to place on frame with min VCs to facilitate right sided scanning.  Pt then positioned in front of mirror and cued to position head in full right rotation to be able to scan and visualize right arm.    Pt then attempted to grasp orange cones placed on right leg and pass to left hand to place on chair to left while using mirror for visual feedback.  Pt needed max assist for shoulder FF and step by step VCs for opening and closing of fingers.  Pt does have full active elbow flexion/extension and digital flexion and extension when pt able to visualize arm in mirror and attending to RUE. Pt returned to room, call bell in reach, seat belt alarm on.    Therapy Documentation Precautions:  Precautions Precautions: Fall Required Braces or Orthoses: Other Brace Other Brace: bilateral AFO's in shoes Restrictions Weight Bearing Restrictions: No   Therapy/Group: Individual Therapy  Ezekiel Slocumb 11/21/2019, 4:33 PM

## 2019-11-21 NOTE — Progress Notes (Signed)
[place] PHYSICAL MEDICINE & REHABILITATION PROGRESS NOTE   Subjective/Complaints: Patient seen sitting up in bed working with therapy this morning.  He indicates he slept well overnight.  His expressive deficits appear to have somewhat improved since I last saw him, discussed and noted by therapies as well.  GI notes reviewed-plan to follow-up as outpatient.  ROS: Limited due to aphasia, but appears to deny CP, shortness of breath, nausea, vomiting, diarrhea.  Objective:   No results found. Recent Labs    11/20/19 0550  WBC 8.3  HGB 14.4  HCT 43.1  PLT 110*   Recent Labs    11/20/19 0550  NA 139  K 4.0  CL 107  CO2 21*  GLUCOSE 102*  BUN 19  CREATININE 1.18  CALCIUM 9.0    Intake/Output Summary (Last 24 hours) at 11/21/2019 1214 Last data filed at 11/20/2019 2115 Gross per 24 hour  Intake 240 ml  Output 100 ml  Net 140 ml     Physical Exam: Vital Signs Blood pressure 114/70, pulse (!) 59, temperature 98.4 F (36.9 C), resp. rate 18, SpO2 97 %. Constitutional: No distress . Vital signs reviewed. HENT: Normocephalic.  Atraumatic. Eyes: EOMI. No discharge. Cardiovascular: No JVD.  RRR. Respiratory: Normal effort.  No stridor.  Bilateral clear to auscultation. GI: Non-distended.  BS +. Skin: Warm and dry.  Intact. Psych: Flat. Musc: No edema in extremities.  No tenderness in extremities. Neuro: Alert Expressive aphasia, improving LUE: 5/5 proximal distal LLE: Hip flexion, knee extension 4-4+/5, ankle dorsiflexion 1/5 RUE: Shoulder abduction 1/5, elbow flexion/extension, wrist extension 0/5, handgrip 3/5, overall limited due to apraxia  RLE: Hip flexion, knee extension 2+/5, ankle dorsiflexion 0/5, unchanged   Assessment/Plan: 1. 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  Physiatrist is providing close team supervision and 24 hour management of active medical problems listed below.  Physiatrist and rehab team continue to assess barriers to discharge/monitor patient progress toward functional and medical goals  Care Tool:  Bathing  Bathing activity did not occur:  (patient refused) Body parts bathed by patient: Right arm, Chest, Abdomen, Front perineal area, Right upper leg, Left upper leg, Face   Body parts bathed by helper: Left arm, Buttocks, Right lower leg, Left lower leg     Bathing assist Assist Level: Moderate Assistance - Patient 50 - 74%     Upper Body Dressing/Undressing Upper body dressing   What is the patient wearing?: Pull over shirt    Upper body assist Assist Level: Moderate Assistance - Patient 50 - 74%    Lower Body Dressing/Undressing Lower body dressing      What is the patient wearing?: Incontinence brief     Lower body assist Assist for lower body dressing: Maximal Assistance - Patient 25 - 49%     Toileting Toileting    Toileting assist Assist for toileting: Moderate Assistance - Patient 50 - 74%     Transfers Chair/bed transfer  Transfers assist     Chair/bed transfer assist level: Maximal Assistance - Patient 25 - 49%     Locomotion Ambulation   Ambulation assist      Assist level: Total Assistance - Patient < 25% Assistive device: Hand held assist Max distance: 3   Walk 10 feet activity   Assist     Assist level: Total  Assistance - Patient < 25% Assistive device: Hand held assist   Walk 50 feet activity   Assist Walk 50 feet with 2 turns activity did not occur: Safety/medical concerns         Walk 150 feet activity   Assist Walk 150 feet activity did not occur: Safety/medical concerns         Walk 10 feet on uneven surface  activity   Assist Walk 10 feet on uneven surfaces activity did not occur: Safety/medical concerns          Wheelchair     Assist Will patient use wheelchair at discharge?: Yes (Per PT long term goals) Type of Wheelchair: Manual    Wheelchair assist level: Maximal Assistance - Patient 25 - 49% Max wheelchair distance: 150    Wheelchair 50 feet with 2 turns activity    Assist        Assist Level: Maximal Assistance - Patient 25 - 49%   Wheelchair 150 feet activity     Assist      Assist Level: Maximal Assistance - Patient 25 - 49%   Blood pressure 114/70, pulse (!) 59, temperature 98.4 F (36.9 C), resp. rate 18, SpO2 97 %.  Medical Problem List and Plan: 1.  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  Antithrombotics: -DVT/anticoagulation: Lovenox             -antiplatelet therapy: Plavix 75 mg daily 3. Pain Management: Tylenol as needed  Initially complained of pain, however on further investigation appears to deny pain-continue to monitor 4. Mood: Provide emotional support             -antipsychotic agents: N/A 5. Neuropsych: This patient is capable of making decisions on his own behalf.with assistance for aphasia  6. Skin/Wound Care: Routine skin checks 7. Fluids/Electrolytes/Nutrition: Routine in and outs.    BMP within acceptable range on 10/11 8.  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  Follow-up outpatient 9.  Hyperlipidemia.  Lipitor 10.  BPH.    Resumed tamsulosin  Improving 11.  Elevated blood pressure reading    Vitals:   11/20/19 1920 11/21/19 0546  BP: 136/85 114/70  Pulse: 82 (!) 59  Resp: 15 18  Temp: 98.4 F (36.9 C)   SpO2: 95% 97%   Controlled on 10/12 12.  Slow transit constipation  Improving with meds 13.  Thrombocytopenia,?  Chronic  Platelets 110 on  10/11  Continue to monitor   LOS: 4 days A FACE TO FACE EVALUATION WAS PERFORMED  Yanni Ruberg Lorie Phenix 11/21/2019, 12:14 PM

## 2019-11-21 NOTE — Progress Notes (Signed)
Physical Therapy Session Note  Patient Details  Name: Philip Stone MRN: [id number] Date of Birth: August 18, 1950  Today's Date: 11/21/2019 PT Individual Time: 0803-0900 PT Individual Time Calculation (min): 57 min   Short Term Goals: Week 1:  PT Short Term Goal 1 (Week 1): Pt will propell WC 146f with min assist and hemi technique PT Short Term Goal 2 (Week 1): Pt will perform bed mobility with min assist PT Short Term Goal 3 (Week 1): [facility] Short Term Goal 4 (Week 1): Pt will ambulate 386fwith mod assist  Skilled Therapeutic Interventions/Progress Updates: Pt presented in bed agreeable to therapy. Pt denies pain during session. Pt performed supine to sit with modA and use of bed features. PTA donned shoes/braces/AFO total A and performed STS with modA however upon standing PTA noted that pt did not have brief on. Pt returned to sitting and PTA doffed shoes/brace/AFO and pt returned to supine to remove pants to don brief. PTA then noted that on lateral aspects of pt's groin pt sink red and irritated. Pt was able to express that area was sore. PTA provided pt with washcloth and pt performed peri-care with PTA assisting to ensure cleanliness. PTA applied barrier cream and notified nsg. Pt returned to sitting EOB and PTA threaded pants and donned brief and  AFO/brace/shoes total A. Pt then performed STS with modA and RW with pt maintaining fair standing balance to allow PTA to pull pants/brief over hips. Pt then attempted stand pivot however unable to coordinate stepping with RLE therefore returned to sitting and performed squat pivot with modA. Pt then transported to rehab gym and performed gait training in parallel bars with modA and max multimodal cues for sequencing. Pt was able to advance RLE however needed assistance for placement of R foot.PTA also provided facilitation of wt shifting to R while blocking R knee to allow improved clearance of LLE. Pt then  participated in ambulation at wall rail x 1557fith modA and requiring facilitation of wt shift to R to allow clearance of LLE. Pt then participated in w/c mobility initially requiring minA then improving to supervision with verbal cues only and PTA educating pt on using "green diamonds" on floor for reference in certain parts of unit. Pt remained in w/c at end of session and left with belt alarm on, call bell within reach and needs met.      Therapy Documentation Precautions:  Precautions Precautions: Fall Required Braces or Orthoses: Other Brace Other Brace: bilateral AFO's in shoes Restrictions Weight Bearing Restrictions: No General:   Vital Signs: Therapy Vitals Pulse Rate: 73 Resp: 18 BP: 133/84 Patient Position (if appropriate): Sitting Oxygen Therapy SpO2: 98 % O2 Device: Room Air Pain: Pain Assessment Pain Scale: 0-10 Pain Score: 0-No pain   Therapy/Group: Individual Therapy  Avo Schlachter  Ailynn Gow, PTA  11/21/2019, 4:21 PM

## 2019-11-22 ENCOUNTER — Inpatient Hospital Stay (HOSPITAL_COMMUNITY): Payer: PPO | Admitting: Occupational Therapy

## 2019-11-22 ENCOUNTER — Encounter (HOSPITAL_COMMUNITY): Payer: PPO | Admitting: Psychology

## 2019-11-22 ENCOUNTER — Inpatient Hospital Stay (HOSPITAL_COMMUNITY): Payer: PPO

## 2019-11-22 ENCOUNTER — Inpatient Hospital Stay (HOSPITAL_COMMUNITY): Payer: PPO | Admitting: Physical Therapy

## 2019-11-22 ENCOUNTER — Inpatient Hospital Stay (HOSPITAL_COMMUNITY): Payer: PPO | Admitting: Speech Pathology

## 2019-11-22 NOTE — Progress Notes (Signed)
Patient ID: Philip Stone, male   DOB: 29-Feb-1950, 69 y.o.   MRN: [id number] Team Conference Report to Patient/Family  Team Conference discussion was reviewed with the patient and caregiver, including goals, any changes in plan of care and target discharge date.  Patient and caregiver express understanding and are in agreement.  The patient has a target discharge date of 12/07/19.  Dyanne Iha 11/22/2019, 1:35 PM

## 2019-11-22 NOTE — Progress Notes (Signed)
Occupational Therapy Session Note  Patient Details  Name: Philip Stone MRN: [id number] Date of Birth: 1950/10/10  Today's Date: 11/22/2019 OT Individual Time: 1300-1340 OT Individual Time Calculation (min): 40 min    Short Term Goals: Week 1:  OT Short Term Goal 1 (Week 1): Patient will complete functional transfers with Mod A and LRAD. OT Short Term Goal 2 (Week 1): Patient will don UB clothing with Min A and use of hemi technique. OT Short Term Goal 3 (Week 1): Patient will recall and demonstrate 3 visual compensatory strategies in prep for ADLs. OT Short Term Goal 4 (Week 1): Patient will thread BLE through LB clothing seated EOB. Week 2:     Skilled Therapeutic Interventions/Progress Updates:    OT intervention with focus on RUE function and scanning to R to increase independence with bADLs. Pt participated in tabletop and seated visual scanning and right attention activities. Pt able to grasp clothespins on left and scan to right to place on frame with min VCs to facilitate right sided scanning. Pt participated in functional activities with focus on grasp and release of lotion bottle. Max verbal cues and tactile facilitation.Pt unable to correctly name colors of clothes pins. Max verbal cues to rotate head to right sufficiently locate items on his R. Pt returned to room and remained in w/c with belt alarm activated and all needs within reach.   Therapy Documentation Precautions:  Precautions Precautions: Fall Required Braces or Orthoses: Other Brace Other Brace: bilateral AFO's in shoes Restrictions Weight Bearing Restrictions: No   Pain:  Pt denies pain this afternoon   Therapy/Group: Individual Therapy  Leroy Libman 11/22/2019, 2:48 PM

## 2019-11-22 NOTE — Progress Notes (Signed)
Speech Language Pathology Daily Session Note  Patient Details  Name: Philip Stone MRN: [id number] Date of Birth: 09-06-1950  Today's Date: 11/22/2019 SLP Individual Time: 0830-0926 SLP Individual Time Calculation (min): 56 min  Short Term Goals: Week 1: SLP Short Term Goal 1 (Week 1): Patient will point to object pictures in field of two with 75% accuracy. SLP Short Term Goal 2 (Week 1): Patient will demonstrate orientation to place, month, year with mod A SLP Short Term Goal 3 (Week 1): Patient will follow two step directions with 80% accuracy and minA. SLP Short Term Goal 4 (Week 1): Patient will demonstrate intellectual awareness during basic functional ADL's and speech-language tasks with modA SLP Short Term Goal 5 (Week 1): Patient will communicate wants/needs at basic level verbally and/or with communication pictures, with modA  Skilled Therapeutic Interventions: Pt was seen for skilled ST targeting cognitive-linguistic goals. Pt required multiple choice cues to orient to hospital and month, however independently able to state name and general reason for hospitalization. He was somewhat perseverative on speaking about his right sided visual impairments at beginning of session, requiring Moderate verbal cues to end perseveration on this topic and engage in other functional language tasks. Pt identified objects via pointing (when provided name of object by SLP) with initially 60% accuracy, increasing to 70% accuracy when task repeated. He required a mix of sentence completion and phonemic cues to then verbally name common objects with - overall Max A. He also required Max A phonemic and semantic cues to verbalize sentences describing actions within photographs. Pt stated he is mostly unaware of verbal errors when making them, when asked by SLP. He is able to repeat at the sentence level without difficulty and communicated his basic needs and wants at phrase and sentence level throughout session  with no more than Min A verbal cues for clarification from SLP. Pt left laying in bed with alarm set and needs within reach. Continue per current plan of care.          Pain Pain Assessment Pain Scale: 0-10 Pain Score: 0-No pain  Therapy/Group: Individual Therapy  Arbutus Leas 11/22/2019, 10:09 AM

## 2019-11-22 NOTE — Progress Notes (Signed)
Occupational Therapy Session Note  Patient Details  Name: Philip Stone MRN: [id number] Date of Birth: [birth year]  Today's Date: 11/22/2019 OT Individual Time: 1031-5945 and 8592-9244 OT Individual Time Calculation (min): 25 min and 26 min   Short Term Goals: Week 1:  OT Short Term Goal 1 (Week 1): Patient will complete functional transfers with Mod A and LRAD. OT Short Term Goal 2 (Week 1): Patient will don UB clothing with Min A and use of hemi technique. OT Short Term Goal 3 (Week 1): Patient will recall and demonstrate 3 visual compensatory strategies in prep for ADLs. OT Short Term Goal 4 (Week 1): Patient will thread BLE through LB clothing seated EOB.   Skilled Therapeutic Interventions/Progress Updates:    Pt greeted at time of session semi-reclined in bed resting but agreeable to OT session. Supine to sit EOB Min/Mod with assist to manage RLE and trunk elevation. Donned pants Max A with pt able to help thread feet but total a to don over hips in standing with Max A for static standing balance as well. Donned shoes/AFOs with total A but able to direct which laces he wanted tied/not tied. Explained squat pivot transfer to the pt prior to attempting, but pt still attempted stand pivot transfer which he performed with Max A, difficulty sequencing and pivoting on feet. Max A to scoot buttocks back into chair as well for positioning. Set up at sink and performed oral hygiene Min. Pt with decreased visual scanning and attention to R side throughout session, with therapist placing items needed and stimuli on R side to increase awareness. Pt set up with alarm on, ca;ll bell in reach. RUE positioned with pillows for elevation and supporting shoulder.   Session 2: Pt greeted at time of session sitting up in wheelchair agreeable to OT session, transported to gym via wheelchair for time management. PROM/AAROM for RUE at shoulder, elbow, forearm, and digits to promote AROM and improved sensation, with  pt stating he is discouraged and frustrated by lack of feeling and sensation on R side, expressive aphasia limiting his communication at times but he is frustrated. Pt guided through NMR for RUE with UE ranger at table level for horizontal abduction/adduction and UE ranger floor pole for shoulder flex/ext. Pt did have minimal AROM with horizontal abd/add at table level, cued not to use compensatory techniques. Pt transported back to room, girlfriend present, pt with alarm on call bell in reach.    Therapy Documentation Precautions:  Precautions Precautions: Fall Required Braces or Orthoses: Other Brace Other Brace: bilateral AFO's in shoes Restrictions Weight Bearing Restrictions: No     Therapy/Group: Individual Therapy  Viona Gilmore 11/22/2019, 10:17 AM

## 2019-11-22 NOTE — Progress Notes (Addendum)
[place] PHYSICAL MEDICINE & REHABILITATION PROGRESS NOTE   Subjective/Complaints:  Pt receiving SLP , pt with word substitution but fluent speech Pt aware of not seeing objects on RIght side very well  ROS: Limited due to aphasia, but appears to deny CP, shortness of breath, nausea, vomiting, diarrhea.  Objective:   No results found. Recent Labs    11/20/19 0550  WBC 8.3  HGB 14.4  HCT 43.1  PLT 110*   Recent Labs    11/20/19 0550  NA 139  K 4.0  CL 107  CO2 21*  GLUCOSE 102*  BUN 19  CREATININE 1.18  CALCIUM 9.0    Intake/Output Summary (Last 24 hours) at 11/22/2019 0826 Last data filed at 11/22/2019 0355 Gross per 24 hour  Intake 600 ml  Output 850 ml  Net -250 ml     Physical Exam: Vital Signs Blood pressure 119/67, pulse 68, temperature 98 F (36.7 C), resp. rate 18, SpO2 96 %.  General: No acute distress Mood and affect are appropriate Heart: Regular rate and rhythm no rubs murmurs or extra sounds Lungs: Clear to auscultation, breathing unlabored, no rales or wheezes Abdomen: Positive bowel sounds, soft nontender to palpation, nondistended Extremities: No clubbing, cyanosis, or edema Skin: No evidence of breakdown, no evidence of rash  Expressive aphasia, improving LUE: 5/5 proximal distal LLE: Hip flexion, knee extension 4-4+/5, ankle dorsiflexion 1/5 RUE: Shoulder abduction 1/5, elbow flexion/extension, wrist extension 0/5, handgrip 3/5, overall limited due to apraxia  RLE: Hip flexion, knee extension 2+/5, ankle dorsiflexion 0/5, unchanged   Assessment/Plan:  1. 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  Physiatrist is providing close team supervision and 24 hour management of active medical problems listed below.  Physiatrist and rehab team continue  to assess barriers to discharge/monitor patient progress toward functional and medical goals  Care Tool:  Bathing  Bathing activity did not occur:  (patient refused) Body parts bathed by patient: Right arm, Chest, Abdomen, Front perineal area, Right upper leg, Left upper leg, Face   Body parts bathed by helper: Left arm, Buttocks, Right lower leg, Left lower leg     Bathing assist Assist Level: Moderate Assistance - Patient 50 - 74%     Upper Body Dressing/Undressing Upper body dressing   What is the patient wearing?: Pull over shirt    Upper body assist Assist Level: Moderate Assistance - Patient 50 - 74%    Lower Body Dressing/Undressing Lower body dressing      What is the patient wearing?: Incontinence brief, Pants     Lower body assist Assist for lower body dressing: Maximal Assistance - Patient 25 - 49%     Toileting Toileting    Toileting assist Assist for toileting: Moderate Assistance - Patient 50 - 74%     Transfers Chair/bed transfer  Transfers assist     Chair/bed transfer assist level: Maximal Assistance - Patient 25 - 49%     Locomotion Ambulation   Ambulation assist      Assist level: Total Assistance - Patient < 25% Assistive device: Hand held assist Max distance: 3   Walk 10 feet activity   Assist     Assist level: Total Assistance - Patient < 25% Assistive device: Hand held assist   Walk 50 feet activity   Assist Walk 50 feet with  2 turns activity did not occur: Safety/medical concerns         Walk 150 feet activity   Assist Walk 150 feet activity did not occur: Safety/medical concerns         Walk 10 feet on uneven surface  activity   Assist Walk 10 feet on uneven surfaces activity did not occur: Safety/medical concerns         Wheelchair     Assist Will patient use wheelchair at discharge?: Yes (Per PT long term goals) Type of Wheelchair: Manual    Wheelchair assist level: Maximal Assistance -  Patient 25 - 49% Max wheelchair distance: 150    Wheelchair 50 feet with 2 turns activity    Assist        Assist Level: Maximal Assistance - Patient 25 - 49%   Wheelchair 150 feet activity     Assist      Assist Level: Maximal Assistance - Patient 25 - 49%   Blood pressure 119/67, pulse 68, temperature 98 F (36.7 C), resp. rate 18, SpO2 96 %.  Medical Problem List and Plan: 1.  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 Formulized individual treatment plan based on medical history, underlying problem and comorbidities.  2.  Antithrombotics: -DVT/anticoagulation: Lovenox             -antiplatelet therapy: Plavix 75 mg daily 3. Pain Management: Tylenol as needed  Initially complained of pain, however on further investigation appears to deny pain-continue to monitor 4. Mood: Provide emotional support             -antipsychotic agents: N/A 5. Neuropsych: This patient is capable of making decisions on his own behalf.with assistance for aphasia  6. Skin/Wound Care: Routine skin checks 7. Fluids/Electrolytes/Nutrition: Routine in and outs.    BMP within acceptable range on 10/11 8.  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  Follow-up outpatient 9.  Hyperlipidemia.  Lipitor 10.  BPH.    Resumed tamsulosin  Improving 11.  Elevated blood pressure reading    Vitals:   11/21/19 1913 11/22/19 0357  BP: 129/71 119/67  Pulse: 70 68  Resp: 16 18  Temp: 98 F (36.7 C) 98 F (36.7 C)  SpO2: 98% 96%    Controlled on 10/13 12.  Slow transit constipation  Improving with meds 13.  Thrombocytopenia,?  Chronic  Platelets 110 on 10/11  Continue to monitor   LOS: 5 days A FACE TO FACE EVALUATION WAS PERFORMED  Charlett Blake 11/22/2019, 8:26 AM

## 2019-11-22 NOTE — Patient Care Conference (Signed)
Inpatient RehabilitationTeam Conference and Plan of Care Update Date: 11/22/2019   Time: 10:12 AM    Patient Name: Philip Stone      Medical Record Number: [id number]  Date of Birth: [birth year] Sex: Male         Room/Bed: 4W10C/4W10C-01 Payor Info: Payor: [employer] / Plan: [employer] PPO / Product Type: *No Product type* /    Admit Date/Time:  11/17/2019 12:41 PM  Primary Diagnosis:  Left thalamic infarction [facility])  Hospital Problems: Principal Problem:   Left thalamic infarction [facility]) Active Problems:   Slow transit constipation   Right hemiparesis [facility])   Apraxia    Expected Discharge Date: Expected Discharge Date: 12/07/19  Team Members Present: Physician leading conference: Dr. Alysia Penna Care Coodinator Present: Dorien Chihuahua, RN, BSN, CRRN;Christina Sampson Goon, [facility] Nurse Present: Other (comment) Susie Cassette, RN) PT Present: Phylliss Bob, PTA OT Present: Lillia Corporal, OT SLP Present: Jettie Booze, CF-SLP PPS Coordinator present : Roosevelt Locks, PT     Current Status/Progress Goal Weekly Team Focus  Bowel/Bladder   patient is incontinent/continent of bowel and bladder. LBM 11/21/19  obtain complete continence of bowel and bladder  q 2-3 hours toileting and PRN to decrease incontinence   Swallow/Nutrition/ Hydration             ADL's   Min A grooming seated, Mod a bathing at sink level, Min A UB dress, Max A LB dressing (including bilateral AFOs). Patient aphasic (expressive/receptive?), R hemianopsia, and ataxia. Increased cueing to sequence familiar tasks.  Supervision A  ADL transfers, functional cognition, RUE NMR, sitting/standing balance   Mobility   modA bed mobility, modA STS, modA gait in parallel bars and wall rail up to 62ft, modA squat pivot, minA w/c mobility due to field cut  supervision overall  R NMR, balance, gait, transfers, d/c planning   Communication   improvement in basic receptive language skills  since admission, Max A for word finding in structured tasks, communicating basic wants and needs at word and phrase level  Min-Mod  word finding, object identification, functional phrases, reading   Safety/Cognition/ Behavioral Observations  Mod A basic  Min A  basic familiar problem solving, intellecutal and error awareness   Pain   Patient denies pain  pain level is <3 with or without activity  Q shift and PRN pain assessment   Skin   Patient has bruising in L foot  prevent skin from further breakdown  Q shift and PRN skin assessment     Discharge Planning:  Goal to discharge home. Girlfriend avaliable for 24/7 supervision. 1 Level home, 2 steps to enter   Team Discussion: BP well controlled. Chronic thrombocytopenia. Right neglect, apraxia,visual field cut, affecting progress with poor sequencing, mild pushing, poor coordination and requirement for constant cues. Receptive language skills improved with repetition and ability to follow basic directions, give appropriate responses. Patient on target to meet rehab goals: yes, with supervision goals set.  *See Care Plan and progress notes for long and short-term goals.   Revisions to Treatment Plan:  Working on error awareness and word finding/substitution Teaching Needs: Transfers, toileting, cues, medications, etc.  Current Barriers to Discharge: Decreased caregiver support  Possible Resolutions to Barriers: Friend education     Medical Summary Current Status: hx CMT with bilateral foot drop, cont bowel and bladder, right HH. Right hemisensory deficits  Barriers to Discharge: Medical stability  Barriers to Discharge Comments: RIght side inattention due to CVA Possible Resolutions to [employer] Focus: work  on apraxia and cue to field cut   Continued Need for Acute Rehabilitation Level of Care: The patient requires daily medical management by a physician with specialized training in physical medicine and rehabilitation for  the following reasons: Direction of a multidisciplinary physical rehabilitation program to maximize functional independence : Yes Medical management of patient stability for increased activity during participation in an intensive rehabilitation regime.: Yes Analysis of laboratory values and/or radiology reports with any subsequent need for medication adjustment and/or medical intervention. : Yes   I attest that I was present, lead the team conference, and concur with the assessment and plan of the team.   Dorien Chihuahua B 11/22/2019, 3:38 PM

## 2019-11-22 NOTE — Progress Notes (Signed)
Physical Therapy Session Note  Patient Details  Name: Philip Stone MRN: [id number] Date of Birth: 12/12/1950  Today's Date: 11/22/2019 PT Individual Time: 1100-1200 PT Individual Time Calculation (min): 60 min   Short Term Goals: Week 1:  PT Short Term Goal 1 (Week 1): Pt will propell WC 1565f with min assist and hemi technique PT Short Term Goal 2 (Week 1): Pt will perform bed mobility with min assist PT Short Term Goal 3 (Week 1): [facility] PT Short Term Goal 4 (Week 1): Pt will ambulate 369fwith mod assist  Skilled Therapeutic Interventions/Progress Updates:   Pt received sitting in WC and agreeable to PT. WC mobility x 65f65ft with min assist and moderate cues for visual scanning to the R to prevent hitting door frames and obstacles on the R. Sit<>stand from WC [facility] mod assist and UE support RW. Once in standing, pt unable to engage quads on the R to prevent knee collapse on 2 attempts. PT provided visual feedback from mirror and performed sit<>stand with HW and RUE over therapist shoulder. Pre-gait stepping task x 5 BLE with max cues for improved weight shift L to advance the RLE as wellas sequencing and attention to the LLE. Gait training with HW 2  x15f88fth max assist and +2 on the first bout for WC follow. Step to pattern starting with the LLE required for proper sequencing. Pt then performed foot tap to target x 5 BLE with LUE supported on HW and max-total A to improve WB through the RLE with adequate terminal knee extension. Patient returned to room and [facility] in reach and all needs met.         Therapy Documentation Precautions:  Precautions Precautions: Fall Required Braces or Orthoses: Other Brace Other Brace: bilateral AFO's in shoes Restrictions Weight Bearing Restrictions: No Pain: Pain Assessment Pain Scale: 0-10 Pain Score: 0-No pain    Therapy/Group: Individual Therapy  AustLorie Phenix13/2021, 1:00  PM

## 2019-11-23 ENCOUNTER — Inpatient Hospital Stay (HOSPITAL_COMMUNITY): Payer: PPO

## 2019-11-23 ENCOUNTER — Inpatient Hospital Stay (HOSPITAL_COMMUNITY): Payer: PPO | Admitting: Physical Therapy

## 2019-11-23 ENCOUNTER — Inpatient Hospital Stay (HOSPITAL_COMMUNITY): Payer: PPO | Admitting: Speech Pathology

## 2019-11-23 NOTE — Progress Notes (Signed)
Speech Language Pathology Daily Session Note  Patient Details  Name: Philip Stone MRN: [id number] Date of Birth: [birth year]  Today's Date: 11/23/2019 SLP Individual Time: 0730-0826 SLP Individual Time Calculation (min): 56 min  Short Term Goals: Week 1: SLP Short Term Goal 1 (Week 1): Patient will point to object pictures in field of two with 75% accuracy. SLP Short Term Goal 2 (Week 1): Patient will demonstrate orientation to place, month, year with mod A SLP Short Term Goal 3 (Week 1): Patient will follow two step directions with 80% accuracy and minA. SLP Short Term Goal 4 (Week 1): Patient will demonstrate intellectual awareness during basic functional ADL's and speech-language tasks with modA SLP Short Term Goal 5 (Week 1): Patient will communicate wants/needs at basic level verbally and/or with communication pictures, with modA  Skilled Therapeutic Interventions: Pt was seen for skilled ST targeting cognitive-linguistic goals. Pt continues to require multiple choice cues to orient to correct time (month/year) and place. Moderate semantic cues to orient to situation. SLP provided Moderate verbal cues to turn his head for visual scanning to the right during scanning tasks. He required Min A verbal and visual cues for detecting and correcting errors during a basic 3-step action card sequencing task. Moderate verbal/sentence completion cues provided for word finding when verbally describing the steps in the photographs (initially descriptions were very vague in nature). Mod A verbal and visual cues provided for problem solving use of TV remote (on/off and surfing channels). Pt also improving in ability to identify common objects via pointing - when provided name of object he pointed to correct object from field of 2 with 80% accuracy in first attempt and 90% accuracy when repeated. Pt left sitting in bed with alarm set and needs within reach. Continue per current plan of care.            Pain Pain Assessment Pain Scale: 0-10 Pain Score: 0-No pain  Therapy/Group: Individual Therapy  Arbutus Leas 11/23/2019, 12:09 PM

## 2019-11-23 NOTE — Progress Notes (Signed)
Physical Therapy Session Note  Patient Details  Name: Philip Stone MRN: [id number] Date of Birth: 11/10/1950  Today's Date: 11/23/2019 PT Individual Time: 1005-1100 PT Individual Time Calculation (min): 55 min   Short Term Goals: Week 1:  PT Short Term Goal 1 (Week 1): Pt will propell WC 132f with min assist and hemi technique PT Short Term Goal 2 (Week 1): Pt will perform bed mobility with min assist PT Short Term Goal 3 (Week 1): [facility] PT Short Term Goal 4 (Week 1): Pt will ambulate 360fwith mod assist  Skilled Therapeutic Interventions/Progress Updates:   Pt received supine in bed and agreeable to PT. Supine>sit transfer with mod assist at trunk with pt self selecting to pull onPT regardless of cues to push from bed features.   Sit<>stand from EOB with min-mod assist and UE support on bed rail to push into standing. PT pulled pants to waist with total A for safety.   Stand [facility] for RLE management. Pt transported to orthogym in WCGrand CoteauDynamic standing balance with dynavision to perform visual scanning task to improve attention to the R side; 3 bouts x 109m40m 2rings. Pt utilized LUE x 2 with mod assist overall to maintain terminal knee extension on the RLE and the use of RUE x 1 wit total A to push buttons and mod assist to prevent R lateral LOB.   Gait training to force use and WB through the RLE with HW 2 x 77f45fth max progressing to mod assist from PT to facilitate weight shift L and advance the RLE. Pt then performed gait with 3 musketeer support x 77ft29fh improved weight shift L and increased step height compared to UE support on RW. Max cues for sequencing throughout, but noted improvement compared to previous sessions.   Patient returned to room and [facility] in reach and all needs met.      Therapy Documentation Precautions:  Precautions Precautions: Fall Required Braces or Orthoses:  Other Brace Other Brace: bilateral AFO's in shoes Restrictions Weight Bearing Restrictions: No   Pain: Pain Assessment Pain Scale: 0-10 Pain Score: 0-No pain   Other Treatments:      Therapy/Group: Individual Therapy  AustiLorie Phenix4/2021, 10:59 AM

## 2019-11-23 NOTE — Progress Notes (Signed)
Occupational Therapy Session Note  Patient Details  Name: Brailyn Delman MRN: [id number] Date of Birth: 04/02/1950  Today's Date: 11/23/2019 OT Individual Time: 5929-2446 OT Individual Time Calculation (min): 55 min    Short Term Goals: Week 1:  OT Short Term Goal 1 (Week 1): Patient will complete functional transfers with Mod A and LRAD. OT Short Term Goal 2 (Week 1): Patient will don UB clothing with Min A and use of hemi technique. OT Short Term Goal 3 (Week 1): Patient will recall and demonstrate 3 visual compensatory strategies in prep for ADLs. OT Short Term Goal 4 (Week 1): Patient will thread BLE through LB clothing seated EOB.  Skilled Therapeutic Interventions/Progress Updates:    Pt resting in w/c upon arrival.  Pt declined bathing/dressing this afternoon and agreeable to focusing on RUE function and scanning strategies/techniques. Pt with increased difficulty activating RUE to grasp objects even with [facility] assist. Emphasis switched to scanning strategies to locate items in R field of vision. Pt initially engaged in clothes pin task with dowel. Pt tasked with locating clothes pins in R visual field and placing on dowel using LUE. Pt consistently overshoots or undershoots placing clothes pins on dowels. Pt with increased scanning visual fields to locate clothes pins. Pt transitioned to Dynavision tasks seated in w/c. Pt engaged in 4 trials with improvements in reaction time improving from 5.46" to 4.23". Half lap tray for RUE provided for pt. Pt returned to room and remained in w/c with belt alarm activated and all needs within reach.  Therapy Documentation Precautions:  Precautions Precautions: Fall Required Braces or Orthoses: Other Brace Other Brace: bilateral AFO's in shoes Restrictions Weight Bearing Restrictions: No    Pain: Pain Assessment Pain Scale: 0-10 Pain Score: 0-No pain   Therapy/Group: Individual Therapy  Leroy Libman 11/23/2019, 2:27 PM

## 2019-11-23 NOTE — Progress Notes (Signed)
Speech Language Pathology Daily Session Note  Patient Details  Name: Philip Stone MRN: [id number] Date of Birth: [birth year]  Today's Date: 11/23/2019 SLP Individual Time: 0973-5329 SLP Individual Time Calculation (min): 28 min  Short Term Goals: Week 1: SLP Short Term Goal 1 (Week 1): Patient will point to object pictures in field of two with 75% accuracy. SLP Short Term Goal 2 (Week 1): Patient will demonstrate orientation to place, month, year with mod A SLP Short Term Goal 3 (Week 1): Patient will follow two step directions with 80% accuracy and minA. SLP Short Term Goal 4 (Week 1): Patient will demonstrate intellectual awareness during basic functional ADL's and speech-language tasks with modA SLP Short Term Goal 5 (Week 1): Patient will communicate wants/needs at basic level verbally and/or with communication pictures, with modA  Skilled Therapeutic Interventions: Pt was seen for skilled ST targeting language skills. Pt's significant other, [name], was present to observe session. Interventions focused and reading and writing at the letter and word levels. Pt required Moderate cues to be able to copy at the word level, including his name - unable to generate letters/words or write to dictation. Pt also required Total A to read at the word level, Mod-Max to read single letters. He did express awareness of an error when attempting to write a word but using the wrong letters. Pt left sitting in chair with alarm set and needs within reach. Continue per current plan of care.          Pain Pain Assessment Pain Scale: 0-10 Pain Score: 0-No pain  Therapy/Group: Individual Therapy  Arbutus Leas 11/23/2019, 3:02 PM

## 2019-11-23 NOTE — Progress Notes (Signed)
[place] PHYSICAL MEDICINE & REHABILITATION PROGRESS NOTE   Subjective/Complaints:  Patient without new issues overnight we discussed his discharge date and he feels okay about it.  ROS: Limited due to aphasia, denies  CP, shortness of breath, nausea, vomiting, diarrhea.  Objective:   No results found. No results for input(s): WBC, HGB, HCT, PLT in the last 72 hours. No results for input(s): NA, K, CL, CO2, GLUCOSE, BUN, CREATININE, CALCIUM in the last 72 hours.  Intake/Output Summary (Last 24 hours) at 11/23/2019 0838 Last data filed at 11/23/2019 0700 Gross per 24 hour  Intake 170 ml  Output 501 ml  Net -331 ml     Physical Exam: Vital Signs Blood pressure 130/69, pulse (!) 57, temperature 97.9 F (36.6 C), temperature source Oral, resp. rate 18, SpO2 98 %.   General: No acute distress Mood and affect are appropriate Heart: Regular rate and rhythm no rubs murmurs or extra sounds Lungs: Clear to auscultation, breathing unlabored, no rales or wheezes Abdomen: Positive bowel sounds, soft nontender to palpation, nondistended Extremities: No clubbing, cyanosis, or edema Skin: No evidence of breakdown, no evidence of rash  Expressive aphasia, improving LUE: 5/5 proximal distal LLE: Hip flexion, knee extension 4-4+/5, ankle dorsiflexion 1/5 RUE: Shoulder abduction 1/5, elbow flexion/extension, wrist extension 0/5, handgrip 3/5, overall limited due to apraxia  RLE: Hip flexion, knee extension 2+/5, ankle dorsiflexion 0/5, unchanged   Assessment/Plan:  1. 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  Physiatrist is providing close team supervision and 24 hour management of active medical problems listed below.  Physiatrist and rehab team continue to assess barriers to discharge/monitor  patient progress toward functional and medical goals  Care Tool:  Bathing  Bathing activity did not occur:  (patient refused) Body parts bathed by patient: Right arm, Chest, Abdomen, Front perineal area, Right upper leg, Left upper leg, Face   Body parts bathed by helper: Left arm, Buttocks, Right lower leg, Left lower leg     Bathing assist Assist Level: Moderate Assistance - Patient 50 - 74%     Upper Body Dressing/Undressing Upper body dressing   What is the patient wearing?: Pull over shirt    Upper body assist Assist Level: Moderate Assistance - Patient 50 - 74%    Lower Body Dressing/Undressing Lower body dressing      What is the patient wearing?: Pants     Lower body assist Assist for lower body dressing: Maximal Assistance - Patient 25 - 49%     Toileting Toileting    Toileting assist Assist for toileting: Moderate Assistance - Patient 50 - 74%     Transfers Chair/bed transfer  Transfers assist     Chair/bed transfer assist level: Maximal Assistance - Patient 25 - 49%     Locomotion Ambulation   Ambulation assist      Assist level: Total Assistance - Patient < 25% Assistive device: Hand held assist Max distance: 3   Walk 10 feet activity   Assist     Assist level: Total Assistance - Patient < 25% Assistive device: Hand held assist   Walk 50 feet activity   Assist Walk 50 feet with 2 turns activity did not occur: Safety/medical concerns         Walk 150 feet activity   Assist Walk 150 feet activity did not  occur: Safety/medical concerns         Walk 10 feet on uneven surface  activity   Assist Walk 10 feet on uneven surfaces activity did not occur: Safety/medical concerns         Wheelchair     Assist Will patient use wheelchair at discharge?: Yes (Per PT long term goals) Type of Wheelchair: Manual    Wheelchair assist level: Maximal Assistance - Patient 25 - 49% Max wheelchair distance: 150     Wheelchair 50 feet with 2 turns activity    Assist        Assist Level: Maximal Assistance - Patient 25 - 49%   Wheelchair 150 feet activity     Assist      Assist Level: Maximal Assistance - Patient 25 - 49%   Blood pressure 130/69, pulse (!) 57, temperature 97.9 F (36.6 C), temperature source Oral, resp. rate 18, SpO2 98 %.  Medical Problem List and Plan: 1.  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  Antithrombotics: -DVT/anticoagulation: Lovenox             -antiplatelet therapy: Plavix 75 mg daily 3. Pain Management: Tylenol as needed No pain complaints 4. Mood: Provide emotional support             -antipsychotic agents: N/A 5. Neuropsych: This patient is capable of making decisions on his own behalf.with assistance for aphasia  6. Skin/Wound Care: Routine skin checks 7. Fluids/Electrolytes/Nutrition: Routine in and outs.    BMP within acceptable range on 10/11 8.  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  Follow-up outpatient 9.  Hyperlipidemia.  Lipitor 10.  BPH.    Resumed tamsulosin  Improving 11.  Elevated blood pressure reading    Vitals:   11/22/19 1529 11/23/19 0528  BP: 132/81 130/69  Pulse: 67 (!) 57  Resp: 17 18  Temp: 98.3 F (36.8 C) 97.9 F (36.6 C)  SpO2: 97% 98%   Controlled on 10/14 12.  Slow transit constipation  Improving with meds 13.  Thrombocytopenia,?  Chronic  Platelets 110 on 10/11  Continue to monitor   LOS: 6 days A FACE TO FACE EVALUATION WAS PERFORMED  Charlett Blake 11/23/2019, 8:38 AM

## 2019-11-24 ENCOUNTER — Other Ambulatory Visit (HOSPITAL_COMMUNITY)
Admission: RE | Admit: 2019-11-24 | Discharge: 2019-11-24 | Disposition: A | Discharge status: Home / Self Care | Payer: PPO | Source: Ambulatory Visit | Attending: Family Medicine | Admitting: Family Medicine

## 2019-11-24 ENCOUNTER — Inpatient Hospital Stay (HOSPITAL_COMMUNITY): Payer: PPO | Admitting: Occupational Therapy

## 2019-11-24 ENCOUNTER — Inpatient Hospital Stay (HOSPITAL_COMMUNITY): Payer: PPO | Admitting: Speech Pathology

## 2019-11-24 ENCOUNTER — Inpatient Hospital Stay (HOSPITAL_COMMUNITY): Payer: PPO | Admitting: Physical Therapy

## 2019-11-24 LAB — CREATININE, SERUM
Creatinine, Ser: 1 mg/dL (ref 0.61–1.24)
GFR, Estimated: >60 mL/min (ref >60)

## 2019-11-24 NOTE — Progress Notes (Signed)
Speech Language Pathology Weekly Progress and Session Note  Patient Details  Name: Philip Stone MRN: [id number] Date of Birth: Feb 27, 1950  Beginning of progress report period: November 18, 2019 End of progress report period: November 24, 2019  Today's Date: 11/24/2019 SLP Individual Time: 1100-1156 SLP Individual Time Calculation (min): 56 min  Short Term Goals: Week 1: SLP Short Term Goal 1 (Week 1): Patient will point to object pictures in field of two with 75% accuracy. SLP Short Term Goal 1 - Progress (Week 1): Met SLP Short Term Goal 2 (Week 1): Patient will demonstrate orientation to place, month, year with mod A SLP Short Term Goal 2 - Progress (Week 1): Met SLP Short Term Goal 3 (Week 1): Patient will follow two step directions with 80% accuracy and minA. SLP Short Term Goal 3 - Progress (Week 1): Progressing toward goal SLP Short Term Goal 4 (Week 1): Patient will demonstrate intellectual awareness during basic functional ADL's and speech-language tasks with modA SLP Short Term Goal 4 - Progress (Week 1): Met SLP Short Term Goal 5 (Week 1): Patient will communicate wants/needs at basic level verbally and/or with communication pictures, with modA SLP Short Term Goal 5 - Progress (Week 1): Met    New Short Term Goals: Week 2: SLP Short Term Goal 1 (Week 2): Patient will follow two step directions with 80% accuracy and min A verbal/visual cues. SLP Short Term Goal 2 (Week 2): Patient will demonstrate intellectual awareness during basic functional ADL's and speech-language tasks with Min A cues. SLP Short Term Goal 3 (Week 2): Pt will verbally name common objects with 50% accuracy when provided Max A multimodal cues. SLP Short Term Goal 4 (Week 2): Pt will communicate basic wants and needs via verbal or multimodal means with Min A cues. SLP Short Term Goal 5 (Week 2): Pt will match words to common objects with 50% accuracy with Max A multimodal cues.  Weekly Progress Updates: Pt has  made functional gains and met 5 out of 5 short term goals. Pt is currently Max assist for word finding and awareness/correcting verbal errors in structured language tasks due to severe expressive aphasia. His receptive language abilities have improved slightly, with only mild deficits remaining. He has also demonstrated improvement in ability to identify common objects via pointing and communicate his very basic wants and needs through words and phrases and gestures. He continues to require moderate cues for intellectual awareness and orientation. Pt and family education is ongoing. Pt would continue to benefit from skilled ST while inpatient in order to maximize functional independence and reduce burden of care prior to discharge. Anticipate that pt will need 24/7 supervision at discharge in addition to [place] follow up at next level of care.       Intensity: Minumum of 1-2 x/day, 30 to 90 minutes Frequency: 3 to 5 out of 7 days Duration/Length of Stay: 12/07/19 Treatment/Interventions: Cognitive remediation/compensation;Multimodal communication approach;Speech/Language facilitation;Functional tasks;Cueing hierarchy;Internal/external aids;Patient/family education   Daily Session  Skilled Therapeutic Interventions: Pt was seen for skilled ST targeting communication goals. SLP facilitated session with phrase completion tasks that were also part of automatic speech to maximize verbal output and accuracy. Pt completed with ~75% accuracy and Mod A phonemic cues. Pt also verbally named common objects from photos with Max A semantic and phonemic cues for accuracy and cues to slow down and reduce verbal errors (frequent word substitutions). Pt also identified which object did not belong from 4 objects with Min A semantic cues. Categories of  objects within photos could be verbalized with Max A. Pt left sitting in wheelchair with alarm set and needs within reach. Continue per current plan of care.         Pain Pain Assessment Pain Scale: 0-10 Pain Score: 0-No pain  Therapy/Group: Individual Therapy  Arbutus Leas 11/24/2019, 11:59 AM

## 2019-11-24 NOTE — Progress Notes (Signed)
Occupational Therapy Session Note  Patient Details  Name: Philip Stone MRN: [id number] Date of Birth: October 09, 1950  Today's Date: 11/24/2019 OT Individual Time: 7471-8550 OT Individual Time Calculation (min): 57 min    Short Term Goals: Week 1:  OT Short Term Goal 1 (Week 1): Patient will complete functional transfers with Mod A and LRAD. OT Short Term Goal 2 (Week 1): Patient will don UB clothing with Min A and use of hemi technique. OT Short Term Goal 3 (Week 1): Patient will recall and demonstrate 3 visual compensatory strategies in prep for ADLs. OT Short Term Goal 4 (Week 1): Patient will thread BLE through LB clothing seated EOB.  Skilled Therapeutic Interventions/Progress Updates:  Patient met lying supine in bed in agreement with OT treatment session. 0/10 pain at rest and with activity. Supine to EOB with HOB elevated and Mod A to bring trunk upright. With moderate multimodal cueing, patient completed squat-pivot transfer to wc on L. Seated at sink level, patient able to doff UB clothing with cues for hemi technique. ADL items placed on R side of sink to encourage compensatory vision techniques. With minimal cueing, patient able to locate all items on R in prep for bathing/grooming. UB bathing/dressing with Min A. Some R proximal shoulder activation noted with patient able to partially abduction RUE while washing axilla. Hand over hand assist to wash LUE. Patient able to wash front perineal area, upper thighs, and L lower leg with assist to wash buttocks in sitting with utilization of lateral leans. Patient able to thread LLE through LB clothing with assist to thread RLE. With multimodal cues, patient stood from wc with R knee block. Max A to hike pants over hips in standing with noted R knee buckling. OT provided education on [employer] E-stim with focus on RUE NMR. E-stim applied. Session concluded with patient seated in wc with call bell within reach, belt alarm activated, and Saebo E-stim  applied to RUE.    Therapy Documentation Precautions:  Precautions Precautions: Fall Required Braces or Orthoses: Other Brace Other Brace: bilateral AFO's in shoes Restrictions Weight Bearing Restrictions: No General:    Therapy/Group: Individual Therapy  Shadaya Marschner R [place]-Davis 11/24/2019, 7:34 AM

## 2019-11-24 NOTE — Progress Notes (Signed)
[place] PHYSICAL MEDICINE & REHABILITATION PROGRESS NOTE   Subjective/Complaints:  Severe word finding deficits, points to his eyes and to his ear to request glasses and hearing aids.  ROS: Limited due to aphasia, denies  CP, shortness of breath, nausea, vomiting, diarrhea.  Objective:   No results found. No results for input(s): WBC, HGB, HCT, PLT in the last 72 hours. Recent Labs    11/24/19 0455  CREATININE 1.00    Intake/Output Summary (Last 24 hours) at 11/24/2019 0837 Last data filed at 11/24/2019 0325 Gross per 24 hour  Intake 130 ml  Output 1150 ml  Net -1020 ml     Physical Exam: Vital Signs Blood pressure 140/74, pulse 82, temperature 98.3 F (36.8 C), temperature source Oral, resp. rate 18, SpO2 97 %.    General: No acute distress Mood and affect are appropriate Heart: Regular rate and rhythm no rubs murmurs or extra sounds Lungs: Clear to auscultation, breathing unlabored, no rales or wheezes Abdomen: Positive bowel sounds, soft nontender to palpation, nondistended Extremities: No clubbing, cyanosis, or edema Skin: No evidence of breakdown, no evidence of rash    Expressive aphasia, speaks at sentence level but has severe anomia LUE: 5/5 proximal distal LLE: Hip flexion, knee extension 4-4+/5, ankle dorsiflexion 1/5 RUE: Shoulder abduction 1/5, elbow flexion/extension, wrist extension 0/5, handgrip 3/5, overall limited due to apraxia  RLE: Hip flexion, knee extension 2+/5, ankle dorsiflexion 0/5, unchanged   Assessment/Plan:  1. 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  Physiatrist is providing close team supervision and 24 hour management of active medical problems listed below.  Physiatrist and rehab team continue to assess barriers to discharge/monitor  patient progress toward functional and medical goals  Care Tool:  Bathing  Bathing activity did not occur:  (patient refused) Body parts bathed by patient: Right arm, Chest, Abdomen, Front perineal area, Right upper leg, Left upper leg, Face   Body parts bathed by helper: Left arm, Buttocks, Right lower leg, Left lower leg     Bathing assist Assist Level: Moderate Assistance - Patient 50 - 74%     Upper Body Dressing/Undressing Upper body dressing   What is the patient wearing?: Pull over shirt    Upper body assist Assist Level: Moderate Assistance - Patient 50 - 74%    Lower Body Dressing/Undressing Lower body dressing      What is the patient wearing?: Pants     Lower body assist Assist for lower body dressing: Maximal Assistance - Patient 25 - 49%     Toileting Toileting    Toileting assist Assist for toileting: Moderate Assistance - Patient 50 - 74%     Transfers Chair/bed transfer  Transfers assist     Chair/bed transfer assist level: Maximal Assistance - Patient 25 - 49%     Locomotion Ambulation   Ambulation assist      Assist level: Total Assistance - Patient < 25% Assistive device: Hand held assist Max distance: 3   Walk 10 feet activity   Assist     Assist level: Total Assistance - Patient < 25% Assistive device: Hand held assist   Walk 50 feet activity   Assist Walk 50 feet with 2 turns activity did not occur: Safety/medical concerns         Walk 150 feet activity   Assist  Walk 150 feet activity did not occur: Safety/medical concerns         Walk 10 feet on uneven surface  activity   Assist Walk 10 feet on uneven surfaces activity did not occur: Safety/medical concerns         Wheelchair     Assist Will patient use wheelchair at discharge?: Yes (Per PT long term goals) Type of Wheelchair: Manual    Wheelchair assist level: Maximal Assistance - Patient 25 - 49% Max wheelchair distance: 150     Wheelchair 50 feet with 2 turns activity    Assist        Assist Level: Maximal Assistance - Patient 25 - 49%   Wheelchair 150 feet activity     Assist      Assist Level: Maximal Assistance - Patient 25 - 49%   Blood pressure 140/74, pulse 82, temperature 98.3 F (36.8 C), temperature source Oral, resp. rate 18, SpO2 97 %.  Medical Problem List and Plan: 1.  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  Antithrombotics: -DVT/anticoagulation: Lovenox             -antiplatelet therapy: Plavix 75 mg daily 3. Pain Management: Tylenol as needed No pain complaints 4. Mood: Provide emotional support             -antipsychotic agents: N/A 5. Neuropsych: This patient is capable of making decisions on his own behalf.with assistance for aphasia  6. Skin/Wound Care: Routine skin checks 7. Fluids/Electrolytes/Nutrition: Routine in and outs.    BMP within acceptable range on 10/11 8.  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  Follow-up outpatient 9.  Hyperlipidemia.  Lipitor 10.  BPH.    Resumed tamsulosin  Improving 11.  Elevated blood pressure reading    Vitals:   11/23/19 1949 11/24/19 0320  BP: 137/73 140/74  Pulse: 72 82  Resp: 18 18  Temp: 98.2 F (36.8 C) 98.3 F (36.8 C)  SpO2: 100% 97%   Controlled on 10/15 12.  Slow transit constipation  Improving with meds 13.  Thrombocytopenia,?  Chronic  Platelets 110 on 10/11  Continue to monitor   LOS: 7 days A FACE TO FACE EVALUATION WAS PERFORMED  Charlett Blake 11/24/2019, 8:37 AM

## 2019-11-24 NOTE — Progress Notes (Signed)
Physical Therapy Weekly Progress Note  Patient Details  Name: Philip Stone MRN: [id number] Date of Birth: 1950/12/03  Beginning of progress report period: November 18, 2019 End of progress report period: November 24, 2019  Today's Date: 11/24/2019 PT Individual Time: 2025-4270 PT Individual Time Calculation (min): 55 min   Patient has met 4 of 4 short term goals.  Pt is making slow progress to mi assist overall, but currently requires mod-max assist of 1 due to profound sensory and visual deficits limiting awareness to the R side.   Patient continues to demonstrate the following deficits muscle weakness, muscle joint tightness and muscle paralysis, decreased cardiorespiratoy endurance, impaired timing and sequencing, abnormal tone, unbalanced muscle activation, motor apraxia, ataxia, decreased coordination and decreased motor planning, decreased visual acuity, decreased visual perceptual skills, decreased visual motor skills and field cut, decreased midline orientation, decreased attention to right, right side neglect and ideational apraxia, decreased initiation, decreased attention, decreased awareness, decreased problem solving and decreased safety awareness and decreased sitting balance, decreased standing balance, decreased postural control, hemiplegia and decreased balance strategies and therefore will continue to benefit from skilled PT intervention to increase functional independence with mobility.  Patient progressing toward long term goals..  Continue plan of care.  PT Short Term Goals Week 1:  PT Short Term Goal 1 (Week 1): Pt will propell WC 199f with min assist and hemi technique PT Short Term Goal 1 - Progress (Week 1): Met PT Short Term Goal 2 (Week 1): Pt will perform bed mobility with min assist PT Short Term Goal 2 - Progress (Week 1): Met PT Short Term Goal 3 (Week 1): [facility] Short Term Goal 3 - Progress (Week 1): Met PT Short Term Goal 4  (Week 1): Pt will ambulate 334fwith mod assist PT Short Term Goal 4 - Progress (Week 1): Met Week 2:  PT Short Term Goal 1 (Week 2): Pt will ambulate [id number] mad assist PT Short Term Goal 2 (Week 2): Pt will propell WC [id number] supervision assist PT Short Term Goal 3 (Week 2): Pt will ascend 4 steps with mod assist and LRAD  Skilled Therapeutic Interventions/Progress Updates:   Pt received sitting in WC and agreeable to PT. Pt transported to rehab gym in WC. [place] to target on 3 inch step 2x 5 with BLE and mod assist fromPT to adequate weight shift L to advance the RLE. Step up/down 3inch ste x 4 with mod assist fading to max assist duet o sequencing deficits and leading with LLE on descent for last step. Max uces for visual scanning to the R for improved attention to the RLE.   Sit,>stand in RW with mod assist and max cues for motor planning and awareness of the RLE. pregait stepping task ing the RW to kick target with RLE on the R side x 5, pt abel to successfully kick target and maintain near midline balance. Gait training with RW and RUE hand splint x  5ft,33fft 81f10ft m50fssist initially with visual cues of mirror, tape on foot and band on R side of RW to improve step width. Until last 2 steps of longest bout pt required only mod assist, but decreasing awareness of the RLE limited safety and positioning of LE requiring max assist to stabilize the RLE from buckling. Max cues throughout for step to gait pattern for safety and to return to midline following each step.   Patient returned to room  and left sitting in [facility] with call bell in reach and all needs met.         Therapy Documentation Precautions:  Precautions Precautions: Fall Required Braces or Orthoses: Other Brace Other Brace: bilateral AFO's in shoes Restrictions Weight Bearing Restrictions: No Vital Signs: Therapy Vitals Temp: 98.3 F (36.8 C) Temp Source: Oral Pulse Rate: 74 Resp: 15 BP: 120/90 Patient Position  (if appropriate): Sitting Oxygen Therapy SpO2: 98 % O2 Device: Room Air Pain: denies  Therapy/Group: Individual Therapy  Lorie Phenix 11/24/2019, 4:15 PM

## 2019-11-25 NOTE — Plan of Care (Signed)
  Problem: RH Ambulation Goal: LTG Patient will ambulate in controlled environment (PT) Description: LTG: Patient will ambulate in a controlled environment, # of feet with assistance (PT). Flowsheets Taken 11/25/2019 1810 LTG: Pt will ambulate in controlled environ  assist needed:: Minimal Assistance - Patient > 75% Taken 11/18/2019 1547 LTG: Ambulation distance in controlled environment: 140ft with LRAD Goal: LTG Patient will ambulate in home environment (PT) Description: LTG: Patient will ambulate in home environment, # of feet with assistance (PT). Flowsheets (Taken 11/25/2019 1810) LTG: Pt will ambulate in home environ  assist needed:: Minimal Assistance - Patient > 75% LTG: Ambulation distance in home environment: 65ft with LRAD

## 2019-11-25 NOTE — Progress Notes (Signed)
[place] PHYSICAL MEDICINE & REHABILITATION PROGRESS NOTE   Subjective/Complaints:  Wife at bedside, discussed stroke deficits as well as prognosis for functional improvement.  ROS: Limited due to aphasia, denies  CP, shortness of breath, nausea, vomiting, diarrhea.  Objective:   No results found. No results for input(s): WBC, HGB, HCT, PLT in the last 72 hours. Recent Labs    11/24/19 0455  CREATININE 1.00    Intake/Output Summary (Last 24 hours) at 11/25/2019 1309 Last data filed at 11/25/2019 0847 Gross per 24 hour  Intake 480 ml  Output 250 ml  Net 230 ml     Physical Exam: Vital Signs Blood pressure 127/69, pulse 60, temperature 97.9 F (36.6 C), temperature source Oral, resp. rate 18, SpO2 98 %.   General: No acute distress Mood and affect are appropriate Heart: Regular rate and rhythm no rubs murmurs or extra sounds Lungs: Clear to auscultation, breathing unlabored, no rales or wheezes Abdomen: Positive bowel sounds, soft nontender to palpation, nondistended Extremities: No clubbing, cyanosis, or edema Skin: No evidence of breakdown, no evidence of rash  Expressive aphasia, speaks at sentence level but has severe anomia LUE: 5/5 proximal distal LLE: Hip flexion, knee extension 4-4+/5, ankle dorsiflexion 1/5 RUE: Shoulder abduction 1/5, elbow flexion/extension, wrist extension 0/5, handgrip 3/5, overall limited due to apraxia  RLE: Hip flexion, knee extension 2+/5, ankle dorsiflexion 0/5, unchanged   Assessment/Plan:  1. 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  Physiatrist is providing close team supervision and 24 hour management of active medical problems listed below.  Physiatrist and rehab team continue to assess barriers to discharge/monitor patient progress  toward functional and medical goals  Care Tool:  Bathing  Bathing activity did not occur:  (patient refused) Body parts bathed by patient: Right arm, Chest, Abdomen, Front perineal area, Right upper leg, Left upper leg, Face, Left lower leg   Body parts bathed by helper: Left arm, Buttocks, Right lower leg     Bathing assist Assist Level: Moderate Assistance - Patient 50 - 74%     Upper Body Dressing/Undressing Upper body dressing   What is the patient wearing?: Pull over shirt    Upper body assist Assist Level: Moderate Assistance - Patient 50 - 74%    Lower Body Dressing/Undressing Lower body dressing      What is the patient wearing?: Pants     Lower body assist Assist for lower body dressing: Moderate Assistance - Patient 50 - 74%     Toileting Toileting    Toileting assist Assist for toileting: Moderate Assistance - Patient 50 - 74%     Transfers Chair/bed transfer  Transfers assist     Chair/bed transfer assist level: Moderate Assistance - Patient 50 - 74%     Locomotion Ambulation   Ambulation assist      Assist level: Total Assistance - Patient < 25% Assistive device: Hand held assist Max distance: 3   Walk 10 feet activity   Assist     Assist level: Total Assistance - Patient < 25% Assistive device: Hand held assist   Walk 50 feet activity   Assist Walk 50 feet with 2 turns activity did not occur: Safety/medical concerns         Walk 150 feet activity   Assist Walk 150 feet activity did not occur: Safety/medical  concerns         Walk 10 feet on uneven surface  activity   Assist Walk 10 feet on uneven surfaces activity did not occur: Safety/medical concerns         Wheelchair     Assist Will patient use wheelchair at discharge?: Yes (Per PT long term goals) Type of Wheelchair: Manual    Wheelchair assist level: Maximal Assistance - Patient 25 - 49% Max wheelchair distance: 150    Wheelchair 50 feet with  2 turns activity    Assist        Assist Level: Maximal Assistance - Patient 25 - 49%   Wheelchair 150 feet activity     Assist      Assist Level: Maximal Assistance - Patient 25 - 49%   Blood pressure 127/69, pulse 60, temperature 97.9 F (36.6 C), temperature source Oral, resp. rate 18, SpO2 98 %.  Medical Problem List and Plan: 1.  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  Antithrombotics: -DVT/anticoagulation: Lovenox             -antiplatelet therapy: Plavix 75 mg daily 3. Pain Management: Tylenol as needed No pain complaints 4. Mood: Provide emotional support             -antipsychotic agents: N/A 5. Neuropsych: This patient is capable of making decisions on his own behalf.with assistance for aphasia  6. Skin/Wound Care: Routine skin checks 7. Fluids/Electrolytes/Nutrition: Routine in and outs.    BMP within acceptable range on 10/11 8.  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  Follow-up outpatient 9.  Hyperlipidemia.  Lipitor 10.  BPH.    Resumed tamsulosin  Improving 11.  Elevated blood pressure reading    Vitals:   11/24/19 2032 11/25/19 0453  BP: (!) 157/80 127/69  Pulse: 91 60  Resp: 18 18  Temp: 97.7 F (36.5 C) 97.9 F (36.6 C)  SpO2: 99% 98%   Controlled on 10/16 12.  Slow transit constipation  Improving with meds 13.  Thrombocytopenia,?  Chronic  Platelets 110 on 10/11, repeat on 10/18  Continue to monitor    LOS: 8 days A FACE TO FACE EVALUATION WAS PERFORMED  Charlett Blake 11/25/2019, 1:09 PM

## 2019-11-26 ENCOUNTER — Inpatient Hospital Stay (HOSPITAL_COMMUNITY): Payer: PPO

## 2019-11-26 LAB — BASIC METABOLIC PANEL
Anion gap: 9 (ref 5–15)
BUN: 17 mg/dL (ref 8–23)
CO2: 25 mmol/L (ref 22–32)
Calcium: 9.1 mg/dL (ref 8.9–10.3)
Chloride: 105 mmol/L (ref 98–111)
Creatinine, Ser: 1.22 mg/dL (ref 0.61–1.24)
GFR, Estimated: >60 mL/min (ref >60)
Glucose, Bld: 103 mg/dL — ABNORMAL HIGH (ref 70–99)
Potassium: 3.9 mmol/L (ref 3.5–5.1)
Sodium: 139 mmol/L (ref 135–145)

## 2019-11-26 LAB — CBC
HCT: 42.2 % (ref 39.0–52.0)
Hemoglobin: 14.1 g/dL (ref 13.0–17.0)
MCH: 28.7 pg (ref 26.0–34.0)
MCHC: 33.4 g/dL (ref 30.0–36.0)
MCV: 85.9 fL (ref 80.0–100.0)
Platelets: 142 10*3/uL — ABNORMAL LOW (ref 150–400)
RBC: 4.91 MIL/uL (ref 4.22–5.81)
RDW: 12.8 % (ref 11.5–15.5)
WBC: 6.8 10*3/uL (ref 4.0–10.5)
nRBC: 0 % (ref 0.0–0.2)

## 2019-11-26 NOTE — Progress Notes (Signed)
Physical Therapy Session Note  Patient Details  Name: Philip Stone MRN: [id number] Date of Birth: 1950/09/29  Today's Date: 11/26/2019 PT Individual Time: 1130-1200 PT Individual Time Calculation (min): 30 min   Short Term Goals: Week 2:  PT Short Term Goal 1 (Week 2): Pt will ambulate 77ft with mad assist PT Short Term Goal 2 (Week 2): Pt will propell WC 157ft wth supervision assist PT Short Term Goal 3 (Week 2): Pt will ascend 4 steps with mod assist and LRAD  Skilled Therapeutic Interventions/Progress Updates:     Session 1: Patient in bed asleep upon PT arrival. Patient easily aroused and agreeable to PT session. Patient denied pain during session, however, reported increased fatigue due to early therapy start. Donned hearing aid and glasses at beginning of session.  Therapeutic Activity: Bed Mobility: Patient utilized the urinal independently at beginning of session, however, required cues to use a wash cloth for peri-care instead of the blanket. Donned pants with max A bed level for energy conservation and time management. He performed supine to sit with mod A, required hand over hand assist for setting bottom R elbow and holding bed rail with L hand, however, preceded to push up on therapist rather than with rail despite cues. Patient sat EOB x10 min to eat breakfast with supervision for sitting balance and set-up for self feeding with L UE. Discussed therapy goals, patient's progress, patient self-reported difficulty with walking and vision in his R visual field. Patient communicated in short phrases and sentences and required 1-2 repetitions from PT intermittently to respond. Donned R AFO and L ankle support and B tennis shoes with total A for time management and energy conservation with patient sitting EOB. Transfers: Patient performed stand/squat pivot bed>w/c with mod-max A without AD, patient focused on end of task and sitting before reaching the w/c required max A for shifting  hips safely into the chair. He performed a squat pivot with cues for technique with mod A with PT facilitating forward trunk flexion and pivot of R LE. He performed stand pivot mat table>w/c with mod A following NMR and provided single step commands breaking the movement into "stand" "turn" "sit" with improved motor planning and safety with transfer.   Wheelchair Mobility:  Patient propelled wheelchair 126 feet with supervision using L UE/LE hemi-technique. Provided verbal cues and demonstration for use of L LE to steer w/c and timing for R UE propulsion with LE. Required cues to avoid objects on the R x3 due to visual deficits and inattention.  Neuromuscular Re-ed: Patient performed the following functional balance activities for improved motor planning and motor control: -blocked practice sit to/from stand x8 from low mat table with mirror in front for external/visual feedback, focused on foot placement, forward weight shift, trunk and hip extension to stand, and controlled descent with L weight shift with multi-modal cues for sequencing and muscle activation throughout; progressed from max A-min A with transfer and mod A-CGA for standing balance 15-30 sec each trial  Patient in w/c with R UE on lap tray in the room at end of session with breaks locked, seat belt alarm set, and all needs within reach.   Session 2: Patient in w/c in the room upon PT arrival. Patient alert and agreeable to PT session. Patient denied pain during session.  Wheelchair Mobility:  Patient propelled wheelchair >150 feet with 2 R and 1L turn from his room to the main rehab gym with supervision. Provided verbal cues for initiating technique and avoiding objects  on the R x1.  Neuromuscular Re-ed: Patient performed the following functional balance and gait activities for improved motor planning and motor control: -sit to/from stand with RW and R hand splint with mirror for external/visual feedback, focus as above with added  sequencing of using RW and hand splint, initially patient attempting to pull up with L UE on the RW, demonstrated focus on end task rather than performing steps to get there, removed RW and performed sit to/from stand as in previous session x2 with min A pushing up from w/c arm rest on L, then performed with RW in front but out of reach of patient and encourage patient to fully stand before grabbing the RW with min A x3, then performed with RW in reach focused on sequencing standing then grabbing the RW, required [facility] assist and heavy cues for opening R UE to place R hand in the hand splint -gait training with RW with R hand splint in front of a mirror for visual feedback focused on sequencing and attention to R LE with stepping with mod progressing to max A. Patient with increasing R knee flexion in stance and LOB to the R with poor foot placement requiring max A to sit in a chair safely; ambulated with step-to gait pattern leading with L following cues, forward trunk flexion, decreased anterior tibial translation in stance (due to decreased B DF ROM), decreased weight shift R, and heavy assist for AD management on the R.  Patient had difficulty following R/L cues throughout session, responded to "towards therapist" or "away from therapist" with improved success.   Patient in w/c in the room at end of session with breaks locked, seat belt alarm set, and all needs within reach.    Therapy Documentation Precautions:  Precautions Precautions: Fall Required Braces or Orthoses: Other Brace Other Brace: bilateral AFO's in shoes Restrictions Weight Bearing Restrictions: No   Therapy/Group: Individual Therapy  Diedra Sinor L Terence Googe PT, DPT  11/26/2019, 12:29 PM

## 2019-11-26 NOTE — Progress Notes (Signed)
Wife at bedside, reports pt has been repeating the same thing for about an hour and is confused about where and when he goes to the restroom.  Pt able to verbalize his sensory deficits affecting the right side of his body, pt perseverating on bladder and bowel. Wife reports this is different. Advised that may be sensory issue and will enter order for timed toileting. Pt able to tell me that has accidents at times but then started talking about the urinal and having bm in urinal. Wife concerned because she reports this is different for him. Called on-call, new orders received.

## 2019-11-27 ENCOUNTER — Ambulatory Visit (HOSPITAL_COMMUNITY): Admission: RE | Admit: 2019-11-27 | Payer: PPO | Source: Home / Self Care

## 2019-11-27 ENCOUNTER — Inpatient Hospital Stay (HOSPITAL_COMMUNITY): Payer: PPO

## 2019-11-27 ENCOUNTER — Inpatient Hospital Stay (HOSPITAL_COMMUNITY): Payer: PPO | Admitting: Physical Therapy

## 2019-11-27 ENCOUNTER — Encounter (HOSPITAL_COMMUNITY): Admission: RE | Payer: Self-pay | Source: Home / Self Care

## 2019-11-27 ENCOUNTER — Inpatient Hospital Stay (HOSPITAL_COMMUNITY): Payer: PPO | Admitting: Occupational Therapy

## 2019-11-27 SURGERY — COLONOSCOPY WITH PROPOFOL
Anesthesia: Monitor Anesthesia Care

## 2019-11-27 MED ORDER — POLYETHYLENE GLYCOL 3350 17 G PO PACK
17.0000 g | PACK | Freq: Two times a day (BID) | ORAL | Status: DC
  Administered 2019-11-27 – 2019-12-06 (×12): 17 g via ORAL
  Filled 2019-11-27 (×20): qty 1

## 2019-11-27 NOTE — Progress Notes (Signed)
[place] PHYSICAL MEDICINE & REHABILITATION PROGRESS NOTE   Subjective/Complaints: Patient seen laying in bed this morning.  He indicates he slept well overnight.  No reported issues overnight.  He is about to work with therapies.  ROS: Limited due to aphasia, but appears to deny CP, shortness of breath, nausea, vomiting, diarrhea.  Objective:   No results found. Recent Labs    11/26/19 1919  WBC 6.8  HGB 14.1  HCT 42.2  PLT 142*   Recent Labs    11/26/19 1919  NA 139  K 3.9  CL 105  CO2 25  GLUCOSE 103*  BUN 17  CREATININE 1.22  CALCIUM 9.1    Intake/Output Summary (Last 24 hours) at 11/27/2019 1448 Last data filed at 11/27/2019 1300 Gross per 24 hour  Intake 120 ml  Output 200 ml  Net -80 ml     Physical Exam: Vital Signs Blood pressure 125/80, pulse 100, temperature 98 F (36.7 C), temperature source Oral, resp. rate 18, SpO2 99 %. Constitutional: No distress . Vital signs reviewed. HENT: Normocephalic.  Atraumatic. Eyes: EOMI. No discharge. Cardiovascular: No JVD.  RRR. Respiratory: Normal effort.  No stridor.  Bilateral clear to auscultation. GI: Non-distended.  BS +. Skin: Warm and dry.  Intact. Psych: Normal mood.  Normal behavior. Musc: No edema in extremities.  No tenderness in extremities. Neuro: Alert Expressive aphasia LUE: 5/5 proximal distal LLE: Hip flexion, knee extension 4-4+/5, ankle dorsiflexion 1/5 RUE: Shoulder abduction 1/5, elbow flexion/extension, wrist extension 0/5, handgrip 3/5, overall limited due to apraxia, unchanged RLE: Hip flexion, knee extension 2+/5, ankle dorsiflexion 0/5, unchanged   Assessment/Plan:  1. 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  Physiatrist is providing close team supervision and 24 hour management of active  medical problems listed below.  Physiatrist and rehab team continue to assess barriers to discharge/monitor patient progress toward functional and medical goals  Care Tool:  Bathing  Bathing activity did not occur:  (patient refused) Body parts bathed by patient: Right arm, Chest, Abdomen, Front perineal area, Right upper leg, Left upper leg, Face, Left lower leg   Body parts bathed by helper: Left arm, Buttocks, Right lower leg     Bathing assist Assist Level: Moderate Assistance - Patient 50 - 74%     Upper Body Dressing/Undressing Upper body dressing   What is the patient wearing?: Pull over shirt    Upper body assist Assist Level: Moderate Assistance - Patient 50 - 74%    Lower Body Dressing/Undressing Lower body dressing      What is the patient wearing?: Pants     Lower body assist Assist for lower body dressing: Moderate Assistance - Patient 50 - 74%     Toileting Toileting    Toileting assist Assist for toileting: Moderate Assistance - Patient 50 - 74%     Transfers Chair/bed transfer  Transfers assist     Chair/bed transfer assist level: Moderate Assistance - Patient 50 - 74%     Locomotion Ambulation   Ambulation assist      Assist level: Moderate Assistance - Patient 50 - 74% Assistive device: Walker-rolling Max distance: 30 ft   Walk 10 feet activity   Assist     Assist level: Moderate Assistance - Patient - 50 - 74% Assistive device: Walker-rolling   Walk 50 feet  activity   Assist Walk 50 feet with 2 turns activity did not occur: Safety/medical concerns         Walk 150 feet activity   Assist Walk 150 feet activity did not occur: Safety/medical concerns         Walk 10 feet on uneven surface  activity   Assist Walk 10 feet on uneven surfaces activity did not occur: Safety/medical concerns         Wheelchair     Assist Will patient use wheelchair at discharge?: Yes (Per PT long term goals) Type of  Wheelchair: Manual    Wheelchair assist level: Supervision/Verbal cueing Max wheelchair distance: >150 ft    Wheelchair 50 feet with 2 turns activity    Assist        Assist Level: Supervision/Verbal cueing   Wheelchair 150 feet activity     Assist      Assist Level: Supervision/Verbal cueing   Blood pressure 125/80, pulse 100, temperature 98 F (36.7 C), temperature source Oral, resp. rate 18, SpO2 99 %.  Medical Problem List and Plan: 1.  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  Antithrombotics: -DVT/anticoagulation: Lovenox             -antiplatelet therapy: Plavix 75 mg daily 3. Pain Management: Tylenol as needed No pain complaints 4. Mood: Provide emotional support             -antipsychotic agents: N/A 5. Neuropsych: This patient is capable of making decisions on his own behalf with accommodations for aphasia  6. Skin/Wound Care: Routine skin checks 7. Fluids/Electrolytes/Nutrition: Routine in and outs.    BMP within acceptable range on 10/11 8.  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  Follow-up outpatient 9.  Hyperlipidemia.    Continue Lipitor 10.  BPH.    Resumed tamsulosin  Improved 11.  Elevated blood pressure reading    Vitals:   11/27/19 0439 11/27/19 1439  BP: 128/82 125/80  Pulse: 60 100  Resp: 16 18  Temp: 97.7 F (36.5 C) 98 F (36.7 C)  SpO2: 98% 99%   Controlled on 10/18 12.  Slow transit constipation  Bowel meds increased on 10/18 13.  Thrombocytopenia,?  Chronic  Platelets 142 on 10/18  Continue to monitor   LOS: 10 days A FACE TO FACE EVALUATION WAS PERFORMED  Gisel Vipond Lorie Phenix 11/27/2019, 2:48 PM

## 2019-11-27 NOTE — Progress Notes (Signed)
Patient ID: Philip Stone, male   DOB: [birth year], 69 y.o.   MRN: [id number]  SW provided [facility] agencies in patients area. Will allow family to review and make a decision on [place].   [place], [place]

## 2019-11-27 NOTE — Progress Notes (Signed)
Physical Therapy Session Note  Patient Details  Name: Philip Stone MRN: [id number] Date of Birth: [birth year]  Today's Date: 11/27/2019 PT Individual Time: 1545-1620 PT Individual Time Calculation (min): 35 min   Short Term Goals: Week 1:  PT Short Term Goal 1 (Week 2): Pt will ambulate 58ft with mad assist PT Short Term Goal 2 (Week 2): Pt will propell WC 175ft wth supervision assist PT Short Term Goal 3 (Week 2): Pt will ascend 4 steps with mod assist and LRAD  Skilled Therapeutic Interventions/Progress Updates:    Patient in room reports thought done with therapy for today.  Max A to don braces and shoes while up in chair.  Propelled w/c x 100' to dayroom with S.  Patient transfer to mat with min to mod A stand step.  Sit <>stand from higher surface of mat x 5 reps.  Standing balance with L UE support on back of chair with work on R UE stretching/tone inhibition techniques.  Patient transferred to L scoot pivot to w/c min A.  Sit to stand at wall rail min A, pt ambulated x 12' with min to mod A cues for placement R LE and for safety.  Transfer to w/c mod A scoot pivot from armchair.  Patient sit to stand back at wall rail to negotiate 4" Step with mod A, performed x 3 trials assist to place and lower R LE.  Patient in w/c propelled to room with S.  Assist to doff R shoe. Pt doffed L shoe.  LEft in w/c with alarm belt and needs in reach.   Therapy Documentation Precautions:  Precautions Precautions: Fall Required Braces or Orthoses: Other Brace Other Brace: bilateral AFO's in shoes Restrictions Weight Bearing Restrictions: No Pain: Pain Assessment Pain Score: 0-No pain    Therapy/Group: Individual Therapy  [name]  [place], [place] 11/27/2019, 6:09 PM

## 2019-11-27 NOTE — Progress Notes (Addendum)
Occupational Therapy Weekly Progress Note  Patient Details  Name: Philip Stone MRN: [id number] Date of Birth: [birth year]  Beginning of progress report period: November 18, 2019 End of progress report period: November 27, 2019  Today's Date: 11/27/2019 OT Individual Time: 1000-1045 OT Individual Time Calculation (min): 45 min    Patient has met 3 of 4 short term goals. Patient is making slow progress toward goals secondary to continued profound sensory and visual deficits making self-care tasks and ADL transfers a hardship. Patient currently able to dof/don UB clothing with Min A and use of hemi technique with occasional cueing for completion of the task. Patient also able to thread LLE through LB clothing but requires assist to thread RLE 2/2 hemiplegia and decreased gross coordination. Patient demonstrates increased ability to locate ADL items on sink surface with use of visual compensatory strategies including lighthouse technique (head turns). Patient able to complete squat-pivot transfers with Mod A and multimodal cues for hand placement, technique and attention to R side.   Patient continues to demonstrate the following deficits: weakness and muscle paralysis, impaired timing and sequencing, abnormal tone, unbalanced muscle activation, motor apraxia, decreased coordination and decreased motor planning, R hemianopsia, decreased attention to right and decreased initiation, decreased problem solving, decreased safety awareness, decreased memory and delayed processing and therefore will continue to benefit from skilled OT intervention to enhance overall performance with BADL and Reduce care partner burden.  Patient progressing toward long term goals..Continue plan of care.  OT Short Term Goals Week 1:  OT Short Term Goal 1 (Week 1): Patient will complete functional transfers with Mod A and LRAD. OT Short Term Goal 1 - Progress (Week 1): Met OT Short Term Goal 2 (Week 1): Patient will don UB  clothing with Min A and use of hemi technique. OT Short Term Goal 2 - Progress (Week 1): Met OT Short Term Goal 3 (Week 1): Patient will recall and demonstrate 3 visual compensatory strategies in prep for ADLs. OT Short Term Goal 3 - Progress (Week 1): Met OT Short Term Goal 4 (Week 1): Patient will thread BLE through LB clothing seated EOB. OT Short Term Goal 4 - Progress (Week 1): Progressing toward goal Week 2:  OT Short Term Goal 1 (Week 2): Patient will thread BLE through LB clothing seated EOB. OT Short Term Goal 2 (Week 2): Patient will don UB clothing with set-up assist utilizing hemi technique OT Short Term Goal 3 (Week 2): Patient will complete oral hygiene with use of RUE as a stabilizer. OT Short Term Goal 4 (Week 2): Patient Complete stand-pivot transfers with Min A and LRAD.   Skilled Therapeutic Interventions/Progress Updates:  Patient met seated in wc in agreement with OT treatment session. 0/10 pain at rest and with activity. Oral hygiene seated at sink level with focus on compensatory vision strategies. Patient continues to have difficulty with word finding. Use of RUE at gross assist level with hand over hand and maximal cueing 2/2 profound visual and sensory deficits. Patient attempting to brush teeth with comb. Hand over hand assist to grasp cup in R hand, fill with water, and bring to mouth to rinse. Patient able to follow 1-step verbal commands to self-propel wc from room to dayroom with use of hemi technique. Patient able to utilize visual scanning techniques to avoid obstacles in the hallway. Seated at high/low table, patient engaged in [place] with towel pushes with use of LUE to assist. [place] E-stim placed on anterior deltoid to facilitate shoulder flexion.  Patient expressed discomfort with shoulder abduction noting less than average ROM in RUE at baseline. Patient returned to room in same manner as noted above. Session concluded with patient seated in wc with belt alarm  activated, call bell within reach, and all needs met.     Therapy Documentation Precautions:  Precautions Precautions: Fall Required Braces or Orthoses: Other Brace Other Brace: bilateral AFO's in shoes Restrictions Weight Bearing Restrictions: No General:    Therapy/Group: Individual Therapy  Adessa Primiano R [place] 11/27/2019, 7:32 AM

## 2019-11-27 NOTE — Progress Notes (Signed)
Patient slept well during the night, no issues. Rn asked if he wanted to go back to sleep or watch TV, he answered watch tv. Requested for his glasses and hearing aid. Needs met. Last toileted at 439 am. No perseveration observed at this time.

## 2019-11-27 NOTE — Progress Notes (Signed)
Physical Therapy Session Note  Patient Details  Name: Philip Stone MRN: [id number] Date of Birth: 04/01/1950  Today's Date: 11/27/2019 PT Individual Time: 0800-0905 PT Individual Time Calculation (min): 65 min   Short Term Goals: Week 2:  PT Short Term Goal 1 (Week 2): Pt will ambulate 14ft with mad assist PT Short Term Goal 2 (Week 2): Pt will propell WC 120ft wth supervision assist PT Short Term Goal 3 (Week 2): Pt will ascend 4 steps with mod assist and LRAD  Skilled Therapeutic Interventions/Progress Updates:     Patient in bed upon PT arrival. Patient alert and agreeable to PT session. Patient denied pain during session. Patient perseverated on his visual deficits at beginning of session with difficulty to redirect initially, improved as patient became more alert with mobility.   Therapeutic Activity: Bed Mobility: Patient donned pants bed level with max A for energy conservation and time management, did recall to place R LE into pants first. Patient performed supine to sit with min A and increased time with heavy cues and HOH assist to facilitate rolling and pushing up with B UEs. Sitting EOB donned patient's R AFO and L ankle brace with B tennis shoes with total A.  Transfers: Patient performed stand pivot bed>w/c with mod A, provided cues for step-by-step sequencing for improved control and safety with transfer. Patient with good carryover of step-by-step sequencing from yesterday's session. He performed sit to/from stand x5 using a L rail with min A progressing to CGA. Provided verbal cues for forward weight shift, R knee extension, reaching back to sit, and L weight shift when sitting.  Gait Training:  Patient ambulated 30 feet forwards and backwards using a L rail with mod A forward and mod-max A backwards. Required facilitation for L weight shift for improved R foot clearance throughout, intermittent facilitation for R LE advancement and placement backwards>forwards due to  decreased awareness and motor control, provided tactile and verbal cues for hamstring activation for controlled R knee extension in stance. He then ambulated 5 feet then 30 feet x2 using RW and R hand splint with ACE wrap over his R hand to secure it to the RW and provide feedback during gait. He progressed from max A to mod A ambulating with the RW with decreasing facilitation and cuing each trial. Required no facilitation for R limb advancement on last trial with min cues. Performed all gait trials in front of a mirror for visual feedback.  Wheelchair Mobility:  Patient was transported in the w/c with total A throughout session for energy conservation and time management.  Neuromuscular Re-ed: Patient performed the following motor control and balance activities in front of a mirror for visual feedback: -seated reciprocal marching while raising opposite UE to initiate out of synergy movement patterns; progressed from heavy cues and HOH assist for sequencing to mod cues without assist x4 min with 1 short rest break -standing balance 2x1-2 min progressing from min A to close supervision with L UE support, focused on R knee extension and equal weight bearing in standing -opening and closing his R hand throughout session, noted decreased motor planning with delayed initiation and no self initiation for pronation for grasping RW, but able with [facility] assist  Patient in w/c in the room at end of session with breaks locked, bed alarm set, and all needs within reach.    Therapy Documentation Precautions:  Precautions Precautions: Fall Required Braces or Orthoses: Other Brace Other Brace: bilateral AFO's in shoes Restrictions Weight Bearing Restrictions: No  Therapy/Group: Individual Therapy  Eryn Marandola L Brance Dartt PT, DPT  11/27/2019, 12:38 PM

## 2019-11-27 NOTE — Progress Notes (Signed)
Physical Therapy Session Note  Patient Details  Name: Philip Stone MRN: [id number] Date of Birth: [birth year]  Today's Date: 11/27/2019 PT Individual Time: 1345-1445 PT Individual Time Calculation (min): 60 min   Short Term Goals: Week 1:  PT Short Term Goal 1 (Week 1): Pt will propell WC 167f with min assist and hemi technique PT Short Term Goal 1 - Progress (Week 1): Met PT Short Term Goal 2 (Week 1): Pt will perform bed mobility with min assist PT Short Term Goal 2 - Progress (Week 1): Met PT Short Term Goal 3 (Week 1): [facility] Short Term Goal 3 - Progress (Week 1): Met PT Short Term Goal 4 (Week 1): Pt will ambulate 366fwith mod assist PT Short Term Goal 4 - Progress (Week 1): Met Week 2:  PT Short Term Goal 1 (Week 2): Pt will ambulate [id number] mad assist PT Short Term Goal 2 (Week 2): Pt will propell WC [id number] supervision assist PT Short Term Goal 3 (Week 2): Pt will ascend 4 steps with mod assist and LRAD  Skilled Therapeutic Interventions/Progress Updates:    [facility] Pt directed in WC mobility 150' to gym at min A for straight paths and mod A for smaller turns especially to R as pt continues to demonstrate visual cut and limited vision on R side; VC for tech and to turn head to scan environment. Pt directed in x10 total STS to FWW at mod A improving to min A with VC for technique and setup with rest breaks between each rep 2/2 fatigue, with these STS pt directed in dynamic standing and duel task training activities of x5 cross midline with LUE to x3 objects on RLE with and bringing them to L side, mod A and intermittent max A for weight shifting and reaching cross midline in standing for stability and max VC for pt to see these objects; x10 weight shifts with BUE support at walker with min A for stability to promote improved weight shifting for gait progress; 2x10 standing marching with BUE support at walker  with mod A and intermittent max A for stability and max VC for sequencing of marching activity and safety; final standing activity x20 reaching cross midline to R with LUE for clothes pins and clipping to targeted basket max A for stability initially and VC to stop activity with LOB or sway felt and to regain balance instead of continuing to attempt activity, improved to mod A. Pt then requested to return to bed at end of session; [facility] and directed in transfer setup to bed however pt then reported he wanted to stay in WC. [facility] directed in seated reciprocal marching and alternating UEs as well for improved mobility for gait training.  Pt demonstrated great difficulty with this and required single step by step cues and visual feedback to complete and then able to complete activity. Pt left in WC, alarm belt set, All needs in reach and in good condition. Call light in hand.    Therapy Documentation Precautions:  Precautions Precautions: Fall Required Braces or Orthoses: Other Brace Other Brace: bilateral AFO's in shoes Restrictions Weight Bearing Restrictions: No Vital Signs: Therapy Vitals Temp: 98 F (36.7 C) Temp Source: Oral Pulse Rate: 100 Resp: 18 BP: 125/80 Patient Position (if appropriate): Sitting Oxygen Therapy SpO2: 99 % O2 Device: Room Air Pain:      Therapy/Group:  Individual Therapy  Junie Panning 11/27/2019, 3:27 PM

## 2019-11-28 ENCOUNTER — Inpatient Hospital Stay (HOSPITAL_COMMUNITY): Payer: PPO | Admitting: Physical Therapy

## 2019-11-28 ENCOUNTER — Inpatient Hospital Stay (HOSPITAL_COMMUNITY): Payer: PPO | Admitting: Speech Pathology

## 2019-11-28 ENCOUNTER — Inpatient Hospital Stay (HOSPITAL_COMMUNITY): Payer: PPO | Admitting: Occupational Therapy

## 2019-11-28 NOTE — Progress Notes (Signed)
Occupational Therapy Session Note  Patient Details  Name: Griffin Gerrard MRN: [id number] Date of Birth: 1950-09-19  Today's Date: 11/28/2019 OT Individual Time: 9597-4718 OT Individual Time Calculation (min): 57 min       Skilled Therapeutic Interventions/Progress Updates:    Patient seated in w/c, alert and ready for therapy session.  He denies pain.  Sit pivot transfer w.c to mat table with min A, min cues for directionality.  Completed posture, seated balance and trunk mobility activities.  Sit to supine with min A.  Completed right UE proximal control activities - right hand with flat hand paddle to inhibit flexor pattern - focus on inhibition of flexors, facilitation of wrist and elbow extension with good results - he was able to hold right UE at 90 degrees of flexion, full elbow extension and wrist extension after NMRE activities.  Supine to sitting with mod A.  Completed weight bearing through right UE with visual motor/scanning peg board activity - min cues to complete tasks and min input to right UE to maintain WB.  Completed standing activity with focus on weight shift to right, stepping with left LE, weight bearing R UE - improved motor planning noted.  Completed seated ER of R UE activity and wrist and hand control tasks.  He remained seated in w/c at close of session, seat belt alarm set and call bell in reach.    Therapy Documentation Precautions:  Precautions Precautions: Fall Required Braces or Orthoses: Other Brace Other Brace: bilateral AFO's in shoes Restrictions Weight Bearing Restrictions: No  Therapy/Group: Individual Therapy  Carlos Levering 11/28/2019, 7:44 AM

## 2019-11-28 NOTE — Progress Notes (Signed)
Speech Language Pathology Daily Session Note  Patient Details  Name: Curren Mohrmann MRN: [id number] Date of Birth: December 26, 1950  Today's Date: 11/28/2019 SLP Individual Time: 1130-1156 SLP Individual Time Calculation (min): 26 min  Short Term Goals: Week 2: SLP Short Term Goal 1 (Week 2): Patient will follow two step directions with 80% accuracy and min A verbal/visual cues. SLP Short Term Goal 2 (Week 2): Patient will demonstrate intellectual awareness during basic functional ADL's and speech-language tasks with Min A cues. SLP Short Term Goal 3 (Week 2): Pt will verbally name common objects with 50% accuracy when provided Max A multimodal cues. SLP Short Term Goal 4 (Week 2): Pt will communicate basic wants and needs via verbal or multimodal means with Min A cues. SLP Short Term Goal 5 (Week 2): Pt will match words to common objects with 50% accuracy with Max A multimodal cues.  Skilled Therapeutic Interventions: Pt was seen for skilled ST targeting communication goals. SLP facilitated session with naming tasks targeting expressive and receptive language skills. Pt identified objects from photos via pointing (from a field of 3) initially with only 6/13 accuracy but when repeated accuracy increased to 10/13 - indicative of good carryover. He also engaged in informal conversation with therapist about his girlfriend and their hobbies with no more than Min A verbal question cues to elaborate responses. Pt also with increase in spontaneous meaningful phrase and sentence level utterances/commentary throughout session today in comparison to previous visits. Pt left sitting in chair with alarm set and needs within reach.        Pain Pain Assessment Pain Scale: 0-10 Pain Score: 0-No pain  Therapy/Group: Individual Therapy  Arbutus Leas 11/28/2019, 12:09 PM

## 2019-11-28 NOTE — Progress Notes (Signed)
Occupational Therapy Session Note  Patient Details  Name: Philip Stone MRN: [id number] Date of Birth: 08-10-1950  Today's Date: 11/28/2019 OT Individual Time: 4098-1191 OT Individual Time Calculation (min): 41 min    Short Term Goals: Week 2:     Skilled Therapeutic Interventions/Progress Updates:    Pt completed transfer from the wheelchair to the therapy mat with mod assist.  He demonstrated increased flexor tone in the RLE in standing, but was able to place it on the ground actively.  On the mat focused on RUE weightbearing tasks to start.  He is able to exhibit active digit extension to 80% but the RUE maintains flexion at rest with increased tone in the flexors.  He demonstrates inability to detect light touch or deep pressure throughout the UE and needs visual fixation with attempted functional use.  Pt is able to report that he has a significant right visual field deficit as well.  Motor apraxia noted with pt attempting to maintain the RUE in weightbearing while reaching for therapist hand with the LUE.  He was unable to follow instructions for holding up one finger on the left hand when given verbal or visual cueing.  Had him work on reaching across midline to the right to pick up rings from the right side of the arc while weightbearing through the RUE and then move them to the left side.  He was asked to identify the color of the ring as well, which he was incorrect on every time.  He progressed to working on picking up a foam block with the RUE presented in his intact visual field.  He needed mod assist for opening and grasping the block a well as reaching to the bedside table to place it.  Gave him foam block to practice this more in his room, but will likely need supervision and assistance for this.  He completed stand pivot transfer back to the wheelchair at [facility] assist level and was transported back to the room.  He finished session sitting up in the wheelchair with the safety belt in  place and the call button and phone in reach.  Half lap tray positioned under the right arm as well.    Therapy Documentation Precautions:  Precautions Precautions: Fall Required Braces or Orthoses: Other Brace Other Brace: bilateral AFO's in shoes Restrictions Weight Bearing Restrictions: No  Pain: Pain Assessment Pain Scale: Faces Pain Score: 0-No pain ADL: See Care Tool Section for some details of mobility and selfcare  Therapy/Group: Individual Therapy  Becker Christopher OTR/L 11/28/2019, 12:16 PM

## 2019-11-28 NOTE — Progress Notes (Signed)
[place] PHYSICAL MEDICINE & REHABILITATION PROGRESS NOTE   Subjective/Complaints: Pt remains with moderately severe aphasia Cannot name even with multiple cues Discussed mobility with PTA, much improved vs last week  ROS: Limited due to aphasia, but appears to deny CP, shortness of breath, nausea, vomiting, diarrhea.  Objective:   No results found. Recent Labs    11/26/19 1919  WBC 6.8  HGB 14.1  HCT 42.2  PLT 142*   Recent Labs    11/26/19 1919  NA 139  K 3.9  CL 105  CO2 25  GLUCOSE 103*  BUN 17  CREATININE 1.22  CALCIUM 9.1    Intake/Output Summary (Last 24 hours) at 11/28/2019 0902 Last data filed at 11/28/2019 0700 Gross per 24 hour  Intake 710 ml  Output 200 ml  Net 510 ml     Physical Exam: Vital Signs Blood pressure 111/67, pulse 73, temperature 98 F (36.7 C), temperature source Oral, resp. rate 16, SpO2 94 %.  General: No acute distress Mood and affect are appropriate Heart: Regular rate and rhythm no rubs murmurs or extra sounds Lungs: Clear to auscultation, breathing unlabored, no rales or wheezes Abdomen: Positive bowel sounds, soft nontender to palpation, nondistended Extremities: No clubbing, cyanosis, or edema Skin: No evidence of breakdown, no evidence of rash   Expressive aphasia LUE: 5/5 proximal distal LLE: Hip flexion, knee extension 4-4+/5, ankle dorsiflexion 1/5 RUE: Shoulder abduction 1/5, elbow flexion/extension, wrist extension 0/5, handgrip 3/5, overall limited due to apraxia, unchanged RLE: Hip flexion, knee extension 2+/5, ankle dorsiflexion 0/5, unchanged   Assessment/Plan:  1. 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  Physiatrist is providing close team supervision and 24 hour management of active medical problems listed  below.  Physiatrist and rehab team continue to assess barriers to discharge/monitor patient progress toward functional and medical goals  Care Tool:  Bathing  Bathing activity did not occur:  (patient refused) Body parts bathed by patient: Right arm, Chest, Abdomen, Front perineal area, Right upper leg, Left upper leg, Face, Left lower leg   Body parts bathed by helper: Left arm, Buttocks, Right lower leg     Bathing assist Assist Level: Moderate Assistance - Patient 50 - 74%     Upper Body Dressing/Undressing Upper body dressing   What is the patient wearing?: Pull over shirt    Upper body assist Assist Level: Moderate Assistance - Patient 50 - 74%    Lower Body Dressing/Undressing Lower body dressing      What is the patient wearing?: Pants     Lower body assist Assist for lower body dressing: Moderate Assistance - Patient 50 - 74%     Toileting Toileting    Toileting assist Assist for toileting: Moderate Assistance - Patient 50 - 74%     Transfers Chair/bed transfer  Transfers assist     Chair/bed transfer assist level: Moderate Assistance - Patient 50 - 74%     Locomotion Ambulation   Ambulation assist      Assist level: Moderate Assistance - Patient 50 - 74% Assistive device: Orthosis (wall rail) Max distance: 12'   Walk 10 feet activity   Assist     Assist level: Moderate Assistance - Patient - 50 - 74% Assistive device: Other (comment), Orthosis (wall rail)   Walk 50 feet activity   Assist  Walk 50 feet with 2 turns activity did not occur: Safety/medical concerns         Walk 150 feet activity   Assist Walk 150 feet activity did not occur: Safety/medical concerns         Walk 10 feet on uneven surface  activity   Assist Walk 10 feet on uneven surfaces activity did not occur: Safety/medical concerns         Wheelchair     Assist Will patient use wheelchair at discharge?: Yes Type of Wheelchair: Manual     Wheelchair assist level: Supervision/Verbal cueing Max wheelchair distance: 100'    Wheelchair 50 feet with 2 turns activity    Assist        Assist Level: Supervision/Verbal cueing   Wheelchair 150 feet activity     Assist      Assist Level: Supervision/Verbal cueing   Blood pressure 111/67, pulse 73, temperature 98 F (36.7 C), temperature source Oral, resp. rate 16, SpO2 94 %.  Medical Problem List and Plan: 1.  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  Antithrombotics: -DVT/anticoagulation: Lovenox             -antiplatelet therapy: Plavix 75 mg daily 3. Pain Management: Tylenol as needed No pain complaints 4. Mood: Provide emotional support             -antipsychotic agents: N/A 5. Neuropsych: This patient is capable of making decisions on his own behalf with accommodations for aphasia  6. Skin/Wound Care: Routine skin checks 7. Fluids/Electrolytes/Nutrition: Routine in and outs.    BMP within acceptable range on 10/17 8.  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  Follow-up outpatient 9.  Hyperlipidemia.    Continue Lipitor 10.  BPH.    Resumed tamsulosin  Improved 11.  Elevated blood pressure reading    Vitals:   11/27/19 1924 11/28/19 0349  BP: 128/74 111/67  Pulse: 74 73  Resp: 18 16  Temp: 98.7 F (37.1 C) 98 F (36.7 C)  SpO2: 100% 94%   Controlled on 10/19 12.  Slow transit constipation  Bowel meds increased on 10/18 13.  Thrombocytopenia,?  Chronic  Platelets 142 on 10/18  Continue to monitor   LOS: 11 days A FACE TO FACE  EVALUATION WAS PERFORMED  Charlett Blake 11/28/2019, 9:02 AM

## 2019-11-28 NOTE — Progress Notes (Signed)
Physical Therapy Session Note  Patient Details  Name: Philip Stone MRN: [id number] Date of Birth: 12/12/1950  Today's Date: 11/28/2019 PT Individual Time: 0805-0900 PT Individual Time Calculation (min): 55 min   Short Term Goals: Week 2:  PT Short Term Goal 1 (Week 2): Pt will ambulate 80f with mad assist PT Short Term Goal 2 (Week 2): Pt will propell WC [id number] supervision assist PT Short Term Goal 3 (Week 2): Pt will ascend 4 steps with mod assist and LRAD  Skilled Therapeutic Interventions/Progress Updates: Pt presented in bed agreeable to therapy. Pt denies pain during session. Pt requesting to change brief prior to exiting bed. Pt performed rolling L/R with supervision and use of bed features to allow PTA to remove brief and pt provided with wet washcloth to cleanse perineal area. PTA donned clean brief maxA with pt rolling in same manner as prior and PTA threaded pants total A for time management and energy conservation. Pt then performed supine to sit with minA and primary assist at trunk. PTA donned brace/AFO/shoes total A for time management and pt performed stand pivot to w/c with modA. Pt then transported to day room and performed stand pivot in same manner. Pt participated in STS no AD with mirror feedback x 5 for midline orientation. Pt also performed reaching task of stacking/moving cups with emphasis on R side for scanning and static balance. Pt demonstrated good scanning to R with verbal cues only to obtain cup and required min tactile cues for keeping R knee engaged when reaching to R. Pt attempted toe taps to 2in step as well as to target on level floor. Pt was able to perform x 2 with minA then pt would keep RLE up on air despite max cues to return to ground. Pt would then require assist to return to mat. Pt unable to state why difficulty returning foot to floor with each occurrence. Performed stand pivot transfer to L with minA to return to w/c. Pt then propelled ~[id number] to room. Pt left in w/c at end of session with belt alarm on, call bell within reach and needs met.       Therapy Documentation Precautions:  Precautions Precautions: Fall Required Braces or Orthoses: Other Brace Other Brace: bilateral AFO's in shoes Restrictions Weight Bearing Restrictions: No General:   Vital Signs: Therapy Vitals Temp: 98.2 F (36.8 C) Pulse Rate: 63 Resp: 18 BP: 113/69 Patient Position (if appropriate): Sitting Oxygen Therapy SpO2: 98 % O2 Device: Room Air Pain: Pain Assessment Pain Scale: Faces Pain Score: 0-No pain   Therapy/Group: Individual Therapy  Shaleta Ruacho  Deane Wattenbarger, PTA  11/28/2019, 3:18 PM

## 2019-11-29 ENCOUNTER — Inpatient Hospital Stay (HOSPITAL_COMMUNITY): Payer: PPO

## 2019-11-29 ENCOUNTER — Inpatient Hospital Stay (HOSPITAL_COMMUNITY): Payer: PPO | Admitting: Physical Therapy

## 2019-11-29 ENCOUNTER — Inpatient Hospital Stay (HOSPITAL_COMMUNITY): Payer: PPO | Admitting: Speech Pathology

## 2019-11-29 NOTE — Progress Notes (Signed)
Occupational Therapy Session Note  Patient Details  Name: Philip Stone MRN: [id number] Date of Birth: 10/12/1950  Today's Date: 11/29/2019 OT Individual Time: 1300-1345 OT Individual Time Calculation (min): 45 min    Short Term Goals: Week 1:  OT Short Term Goal 1 (Week 1): Patient will complete functional transfers with Mod A and LRAD. OT Short Term Goal 2 (Week 1): Patient will don UB clothing with Min A and use of hemi technique. OT Short Term Goal 3 (Week 1): Patient will recall and demonstrate 3 visual compensatory strategies in prep for ADLs. OT Short Term Goal 4 (Week 1): Patient will thread BLE through LB clothing seated EOB.  Skilled Therapeutic Interventions/Progress Updates:    Pt resting in w/c upon arrival.  Pt agreeable to participating in therapy.  Pt transitioned to Day Room and performed squat pivot transfer to mat with min A and mod verbal cues for sequencing. OT intervention with focus on RUE NMR (see below) and functional reaching and grasp/release. Pt with significant R hand flexor patterns but able to extend fingers when commanded. Weight bearing through RUE while using LUE to move rings in ROM arc and naming colors. Pt 25% accurate with color names. Pt practiced picking up cup and placing on table with max A and max verbal cues for sequencing. Pt cued to scan to his R to locate cup placed on table. Pt transferred back to w/c and returned to room.  Pt remained in w/c with all needs within reach and belt alarm activated.   Therapy Documentation Precautions:  Precautions Precautions: Fall Required Braces or Orthoses: Other Brace Other Brace: bilateral AFO's in shoes Restrictions Weight Bearing Restrictions: No   Pain: Pain Assessment Pain Scale: 0-10 Pain Score: 0-No pain   Other Treatments: Treatments Neuromuscular Facilitation: Right;Upper Extremity;Activity to increase coordination;Activity to increase motor control;Activity to increase grading;Activity to  increase timing and sequencing Weight Bearing Technique Weight Bearing Technique: Yes RUE Weight Bearing Technique: Extended arm seated   Therapy/Group: Individual Therapy  Leroy Libman 11/29/2019, 1:44 PM

## 2019-11-29 NOTE — Progress Notes (Signed)
Patient ID: Philip Stone, male   DOB: December 18, 1950, 69 y.o.   MRN: [id number] Team Conference Report to Patient/Family  Team Conference discussion was reviewed with the patient and caregiver, including goals, any changes in plan of care and target discharge date.  Patient and caregiver express understanding and are in agreement.  The patient has a target discharge date of 12/08/19.  Dyanne Iha 11/29/2019, 1:45 PM

## 2019-11-29 NOTE — Patient Care Conference (Signed)
Inpatient RehabilitationTeam Conference and Plan of Care Update Date: 11/29/2019   Time: 10:08 AM    Patient Name: Philip Stone      Medical Record Number: [id number]  Date of Birth: 02/14/1950 Sex: Male         Room/Bed: 4W10C/4W10C-01 Payor Info: Payor: [employer] / Plan: [employer] Must PPO / Product Type: *No Product type* /    Admit Date/Time:  11/17/2019 12:41 PM  Primary Diagnosis:  Left thalamic infarction [facility])  Hospital Problems: Principal Problem:   Left thalamic infarction [facility]) Active Problems:   Slow transit constipation   Right hemiparesis [facility])   Apraxia    Expected Discharge Date: Expected Discharge Date: 12/08/19  Team Members Present: Physician leading conference: Dr. Alysia Penna Care Coodinator Present: Dorien Chihuahua, RN, BSN, CRRN;Christina Sampson Goon, [place] Nurse Present: Other (comment) Susie Cassette, RN) PT Present: Barrie Folk, PT OT Present: Turner Daniels, OT SLP Present: Jettie Booze, CF-SLP PPS Coordinator present : Ileana Ladd, Burna Mortimer, SLP     Current Status/Progress Goal Weekly Team Focus  Bowel/Bladder   Pt is cont of b/b. LBM 11/26/19  obtain complete continence of bowel and bladder  q2h toileting/PRN   Swallow/Nutrition/ Hydration             ADL's   Min A grooming, Mod A bathing, Min A UB dressing, Mod to Max A LB dressing. Profound visual/sensory/motor deficits, R hemianopsia, and ataxia. Slow progress toward goals.  Supervision A  ADL transfers, functional cognition, RUE NMR, sitting/standing balance.   Mobility   modA bed mobility, minA STS, modA squat pivot transfers, modA gait up to 27ft, continues to demonstrate significant sensory deficits and R field cut  supervision overall  R NMR, transfers, balance, gait   Communication   good improvement in expressive abilities this week, Mod A for word finding in structured tasks, communicating about familiar topics Sup-Min  Min-Mod  word finding,  object identification, functional conversation, reading, writing, education   Safety/Cognition/ Behavioral Observations  Min-Mod  Min  basic familiar problem solving, intellectual and emergent awareness, recall of functionla day to day info and strategies   Pain   pt denies pain at this time.  pain level is <3 with or without activity  Assess pain qshift/PRN   Skin   Pt has no obvious signs of skin breakdown/infection at this time  prevent skin from further breakdown  Assess skin qshift/PRN     Discharge Planning:  Goal to d/c home with significant other to stay with patient. 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 Requires visual cues and note wide base of support.  Patient on target to meet rehab goals: No, goals downgraded and adjusted for visual deficits and need for compensatory strategies.  *See Care Plan and progress notes for long and short-term goals.   Revisions to Treatment Plan:  Downgraded OT goals   Teaching Needs: Prompts needed to ellaborate with communication, compensatory strategies, visual cues needed for activities, toileting, transfers, and medications, etc.  Current Barriers to Discharge: Decreased caregiver support  Possible Resolutions to Barriers:  Family education    Medical Summary Current Status: Continues to exhibit severe right field cut as well as severe proprioceptive deficits in the right upper and right lower limb  Barriers to Discharge: Medical stability;Other (comments)  Barriers to Discharge Comments: Poor awareness of right side due to a combination of  sensory deficits, apraxia as well as field cut Possible Resolutions to [employer] Focus: Work across disciplines to help with right-sided in awareness, continue to monitor platelet counts   Continued Need for Acute Rehabilitation Level of Care: The  patient requires daily medical management by a physician with specialized training in physical medicine and rehabilitation for the following reasons: Direction of a multidisciplinary physical rehabilitation program to maximize functional independence : Yes Medical management of patient stability for increased activity during participation in an intensive rehabilitation regime.: Yes Analysis of laboratory values and/or radiology reports with any subsequent need for medication adjustment and/or medical intervention. : Yes   I attest that I was present, lead the team conference, and concur with the assessment and plan of the team.   Dorien Chihuahua B 11/29/2019, 2:53 PM

## 2019-11-29 NOTE — Progress Notes (Signed)
[place] PHYSICAL MEDICINE & REHABILITATION PROGRESS NOTE   Subjective/Complaints: Patient seen in therapy today.  He continues exhibit poor awareness of the right side as well as right visual field cut.  Physical therapy using cueing visually for foot placement.  Appears to scissor but this is not due to increased tone  ROS: Limited due to aphasia, but appears to deny CP, shortness of breath, nausea, vomiting, diarrhea.  Objective:   No results found. Recent Labs    11/26/19 1919  WBC 6.8  HGB 14.1  HCT 42.2  PLT 142*   Recent Labs    11/26/19 1919  NA 139  K 3.9  CL 105  CO2 25  GLUCOSE 103*  BUN 17  CREATININE 1.22  CALCIUM 9.1    Intake/Output Summary (Last 24 hours) at 11/29/2019 0927 Last data filed at 11/29/2019 4166 Gross per 24 hour  Intake 590 ml  Output 516 ml  Net 74 ml     Physical Exam: Vital Signs Blood pressure 117/69, pulse 60, temperature 97.8 F (36.6 C), temperature source Oral, resp. rate 18, SpO2 95 %.  General: No acute distress Mood and affect are appropriate Heart: Regular rate and rhythm no rubs murmurs or extra sounds Lungs: Clear to auscultation, breathing unlabored, no rales or wheezes Abdomen: Positive bowel sounds, soft nontender to palpation, nondistended Extremities: No clubbing, cyanosis, or edema Skin: No evidence of breakdown, no evidence of rash  Expressive aphasia LUE: 5/5 proximal distal LLE: Hip flexion, knee extension 4-4+/5, ankle dorsiflexion 1/5 RUE: Shoulder abduction 1/5, elbow flexion/extension, wrist extension 0/5, handgrip 3/5, overall limited due to apraxia, unchanged RLE: Hip flexion, knee extension 2+/5, ankle dorsiflexion 0/5, unchanged   Assessment/Plan:  1. 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  Physiatrist is providing close team supervision and 24 hour management of active medical problems listed below.  Physiatrist and rehab team continue to assess barriers to discharge/monitor patient progress toward functional and medical goals  Care Tool:  Bathing  Bathing activity did not occur:  (patient refused) Body parts bathed by patient: Right arm, Chest, Abdomen, Front perineal area, Right upper leg, Left upper leg, Face, Left lower leg   Body parts bathed by helper: Left arm, Buttocks, Right lower leg     Bathing assist Assist Level: Moderate Assistance - Patient 50 - 74%     Upper Body Dressing/Undressing Upper body dressing   What is the patient wearing?: Pull over shirt    Upper body assist Assist Level: Moderate Assistance - Patient 50 - 74%    Lower Body Dressing/Undressing Lower body dressing      What is the patient wearing?: Pants     Lower body assist Assist for lower body dressing: Moderate Assistance - Patient 50 - 74%     Toileting Toileting    Toileting assist Assist for toileting: Moderate Assistance - Patient 50 - 74%     Transfers Chair/bed transfer  Transfers assist     Chair/bed transfer assist level: Minimal Assistance - Patient > 75%     Locomotion Ambulation   Ambulation assist      Assist level: Minimal Assistance - Patient > 75% Assistive device: Walker-platform Max distance: 60   Walk 10 feet activity   Assist     Assist level: Minimal Assistance - Patient > 75% Assistive  device: Walker-rolling, Orthosis   Walk 50 feet activity   Assist Walk 50 feet with 2 turns activity did not occur: Safety/medical concerns  Assist level: Minimal Assistance - Patient > 75% Assistive device: Walker-rolling, Orthosis    Walk 150 feet activity   Assist Walk 150 feet activity did not occur: Safety/medical concerns         Walk 10 feet on uneven surface  activity   Assist Walk 10 feet on uneven surfaces  activity did not occur: Safety/medical concerns         Wheelchair     Assist Will patient use wheelchair at discharge?: Yes Type of Wheelchair: Manual    Wheelchair assist level: Supervision/Verbal cueing Max wheelchair distance: 175    Wheelchair 50 feet with 2 turns activity    Assist        Assist Level: Supervision/Verbal cueing   Wheelchair 150 feet activity     Assist      Assist Level: Supervision/Verbal cueing   Blood pressure 117/69, pulse 60, temperature 97.8 F (36.6 C), temperature source Oral, resp. rate 18, SpO2 95 %.  Medical Problem List and Plan: 1.  Right side hemiparesis with significant apraxia and aphasia secondary to acute infarct involving the left medial temporal region, left thalamic area and left occipital region consistent with infarct involving the left posterior cerebral artery distribution RIght homonymous hemianopsia, mild fluent aphasia RIght hemisensory def   Continue CIR PT, OT, SLP Team conference today please see physician documentation under team conference tab, met with team  to discuss problems,progress, and goals. Formulized individual treatment plan based on medical history, underlying problem and comorbidities.  2.  Antithrombotics: -DVT/anticoagulation: Lovenox             -antiplatelet therapy: Plavix 75 mg daily 3. Pain Management: Tylenol as needed No pain complaints 4. Mood: Provide emotional support             -antipsychotic agents: N/A 5. Neuropsych: This patient is capable of making decisions on his own behalf with accommodations for aphasia  6. Skin/Wound Care: Routine skin checks 7. Fluids/Electrolytes/Nutrition: Routine in and outs.    BMP within acceptable range on 10/17 8.  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  Follow-up outpatient 9.  Hyperlipidemia.    Continue Lipitor 10.  BPH.    Resumed tamsulosin  Improved 11.  Elevated blood pressure reading     Vitals:   11/28/19 2005 11/29/19 0346  BP: 118/66 117/69  Pulse: 62 60  Resp: 18 18  Temp: 98.1 F (36.7 C) 97.8 F (36.6 C)  SpO2: 97% 95%   Controlled on 10/20 12.  Slow transit constipation  Bowel meds increased on 10/18 13.  Thrombocytopenia,?  Chronic  Platelets 142 on 10/18, may need to hold enoxaparin if platelet count drops to less than 100,000  Continue to monitor   LOS: 12 days A FACE TO FACE EVALUATION WAS PERFORMED  Charlett Blake 11/29/2019, 9:27 AM

## 2019-11-29 NOTE — Progress Notes (Signed)
Speech Language Pathology Daily Session Note  Patient Details  Name: Philip Stone MRN: [id number] Date of Birth: [birth year]  Today's Date: 11/29/2019 SLP Individual Time: 0927-0955 SLP Individual Time Calculation (min): 28 min  Short Term Goals: Week 2: SLP Short Term Goal 1 (Week 2): Patient will follow two step directions with 80% accuracy and min A verbal/visual cues. SLP Short Term Goal 2 (Week 2): Patient will demonstrate intellectual awareness during basic functional ADL's and speech-language tasks with Min A cues. SLP Short Term Goal 3 (Week 2): Pt will verbally name common objects with 50% accuracy when provided Max A multimodal cues. SLP Short Term Goal 4 (Week 2): Pt will communicate basic wants and needs via verbal or multimodal means with Min A cues. SLP Short Term Goal 5 (Week 2): Pt will match words to common objects with 50% accuracy with Max A multimodal cues.  Skilled Therapeutic Interventions: Pt was seen for skilled ST targeting cognitive-linguistic goals. Pt continues to require multiple choice cues for orientation to place (hospital) and Total A for orienting to month (he consistently states it is March and confirms he does not have awareness of time). Pt with limited reading ability due to language deficits, however targeted reading simple words (October and Hospital) and signs posted to assist with carryover of basic orientation information. Will continue to target reading skills in future sessions. Pt also completed a semi-complex 4-step action card sequencing task with Mn A verbal cues for awareness of 1 error. He also required fluctuating Mod-Max A phonemic, sentence completion, and other semantic cues to describe actions and objects within photos during sequencing task. He continues to provide somewhat generic/vague responses during very structured language tasks, while informal conversational task familiar to pt are improving. Pt left sitting in chair with alarm set and  needs within reach. Continue per current plan of care.          Pain Pain Assessment Pain Scale: 0-10 Pain Score: 0-No pain  Therapy/Group: Individual Therapy  Arbutus Leas 11/29/2019, 10:22 AM

## 2019-11-29 NOTE — Progress Notes (Signed)
Patient ID: Philip Stone, male   DOB: 05-30-1950, 69 y.o.   MRN: [id number]  Family education scheduled for December 06, 2019  Flanagan [place], [place]

## 2019-11-29 NOTE — Progress Notes (Signed)
Physical Therapy Session Note  Patient Details  Name: Philip Stone MRN: [id number] Date of Birth: Sep 15, 1950  Today's Date: 11/29/2019 PT Individual Time: 1430-1530 PT Individual Time Calculation (min): 60 min   Short Term Goals: Week 2:  PT Short Term Goal 1 (Week 2): Pt will ambulate 21f with mad assist PT Short Term Goal 2 (Week 2): Pt will propell WC 1578fwth supervision assist PT Short Term Goal 3 (Week 2): Pt will ascend 4 steps with mod assist and LRAD  Skilled Therapeutic Interventions/Progress Updates: Pt presented in w/c agreeable to therapy. Pt denies pain but states some fatigue. Pt transported to rehab gym for energy conservation. Pt participated in standing balance activities including reaching and placing horseshoes with LUE with emphasis on placing to R. Pt also participated in several rounds of horseshoes, standing with RW and reaching with LUE with emphasis on reaching to R.  Pt then participated in gait training 4053f 2 with RW and minA. Pt required intermittent cues for RW management, increasing R step length, and increasing BOS. Pt was able to perform x 2 turns with each ambulation bout requiring near modA. Pt then taken to parallel bars and performed toe taps to 4 in step with LLE for increased wt shifting to R. Pt propelled w/c back to room >150f41fth supervision and min cues for object negotiation. Pt requested to remain in w/c at end of session and left with belt alarm on, call bell within reach and needs met.      Therapy Documentation Precautions:  Precautions Precautions: Fall Required Braces or Orthoses: Other Brace Other Brace: bilateral AFO's in shoes Restrictions Weight Bearing Restrictions: No General:   Vital Signs: Therapy Vitals Temp: 98.2 F (36.8 C) Temp Source: Oral Pulse Rate: 86 Resp: 18 BP: 127/80 Patient Position (if appropriate): Sitting Oxygen Therapy SpO2: 99 % O2 Device: Room Air   Other Treatments: Treatments Neuromuscular  Facilitation: Right;Upper Extremity;Activity to increase coordination;Activity to increase motor control;Activity to increase grading;Activity to increase timing and sequencing Weight Bearing Technique Weight Bearing Technique: Yes RUE Weight Bearing Technique: Extended arm seated    Therapy/Group: Individual Therapy  Virgel Haro  Likisha Alles, PTA  11/29/2019, 3:57 PM

## 2019-11-29 NOTE — Progress Notes (Signed)
Physical Therapy Session Note  Patient Details  Name: Philip Stone MRN: [id number] Date of Birth: Jan 15, 1951  Today's Date: 11/29/2019 PT Individual Time: 0804-0903 PT Individual Time Calculation (min): 59 min   Short Term Goals: Week 2:  PT Short Term Goal 1 (Week 2): Pt will ambulate 73f with mad assist PT Short Term Goal 2 (Week 2): Pt will propell WC 1523fwth supervision assist PT Short Term Goal 3 (Week 2): Pt will ascend 4 steps with mod assist and LRAD    Skilled Therapeutic Interventions/Progress Updates:   .Pt received supine in bed and agreeable to PT. Supine>sit transfer with supervision assist, heavy use of rails with LUE. Total A to don Bil shoes and AFOs. Stand pivot transfer with min assist and moderate cues for UE placement on WC arm rest to [facility] present for medication administration  WC mobility with supervision assist x 17512fith only min cues for visual scanniing to navigate doorway into gym.   Dynamic balance training with cross body reach to the R with LUE  3 x 8 each direction to force improved visuaol scanning and UE support through the RLE/RUE   Gait training x50f37fft with min assist from PT, RW, hand splint, and visual aids of co-band wrap to the RLE and R side of RW to improve step width, length and attention to the R side.  Dynamic gait training in parallel bars with min assist to control the RUE and moderate cues for improved sequencing  And attention to the RLE; forward/reverse 2x8ft 75f side stepping R and L 2 x 8ft b66f   Patient returned to room and left sitting in [facility] in reach and all needs met.         Therapy Documentation Precautions:  Precautions Precautions: Fall Required Braces or Orthoses: Other Brace Other Brace: bilateral AFO's in shoes Restrictions Weight Bearing Restrictions: No    Pain: Pain Assessment Pain Scale: 0-10 Pain Score: 0-No pain    Therapy/Group: Individual Therapy  AustinLorie Phenix/2021, 9:04 AM

## 2019-11-30 ENCOUNTER — Inpatient Hospital Stay (HOSPITAL_COMMUNITY): Payer: PPO | Admitting: Speech Pathology

## 2019-11-30 ENCOUNTER — Inpatient Hospital Stay (HOSPITAL_COMMUNITY): Payer: PPO | Admitting: Physical Therapy

## 2019-11-30 ENCOUNTER — Inpatient Hospital Stay (HOSPITAL_COMMUNITY): Payer: PPO | Admitting: Occupational Therapy

## 2019-11-30 NOTE — Progress Notes (Signed)
Physical Therapy Session Note  Patient Details  Name: Philip Stone MRN: [id number] Date of Birth: 1950/08/04  Today's Date: 11/30/2019 PT Individual Time: 1430-1530 PT Individual Time Calculation (min): 60 min   Short Term Goals: Week 2:  PT Short Term Goal 1 (Week 2): Pt will ambulate 30f with mad assist PT Short Term Goal 2 (Week 2): Pt will propell WC 1524fwth supervision assist PT Short Term Goal 3 (Week 2): Pt will ascend 4 steps with mod assist and LRAD  Skilled Therapeutic Interventions/Progress Updates:   Pt received sitting in WC and agreeable to PT. Pt transported to orthogym in [place]stand [facility] C[facility]-min A throughout treatment session. Dynavision dynamic balance training  Performed with RUE (3 rings) then LUE(2 rings) with 1 UE support throughout. CGA from  PT for balance and while performing the LUE and max assist for coordination of the RUE on second bout.   Gait training with RW 2 x 8011fith min assist and min-moderate cues for AD management and use of  Visual aids to improve sequencing and attention to the RLE. Pt the performed dynamic gait training with RW to weave through 4 cones x 2 with min assist and moderate cues for AD management and cues to improve visual scanning to the R to locate obstacles.   Pt performed stepping task over 1 inch obstacles in floor forward and reverse 2x 10 BLE with min-mod assist and max cues for improved hip/knee flexion to return RLE to start position.   Patient returned to room and left sitting in [facility] in reach and all needs met.         Therapy Documentation Precautions:  Precautions Precautions: Fall Required Braces or Orthoses: Other Brace Other Brace: bilateral AFO's in shoes Restrictions Weight Bearing Restrictions: No Vital Signs: Therapy Vitals Temp: 98.2 F (36.8 C) Temp Source: Oral Pulse Rate: (!) 58 Resp: 18 BP: 113/69 Patient Position (if appropriate): Sitting Oxygen Therapy SpO2: 99  % O2 Device: Room Air Pain: Pain Assessment Pain Scale: 0-10 Pain Score: 0-No pain    Therapy/Group: Individual Therapy  AusLorie Phenix/21/2021, 4:01 PM

## 2019-11-30 NOTE — Progress Notes (Signed)
Speech Language Pathology Weekly Progress and Session Note  Patient Details  Name: Philip Stone MRN: [id number] Date of Birth: [birth year]  Beginning of progress report period: November 24, 2019 End of progress report period: November 30, 2019  Today's Date: 11/30/2019 SLP Individual Time: 5277-8242 SLP Individual Time Calculation (min): 57 min  Short Term Goals: Week 2: SLP Short Term Goal 1 (Week 2): Patient will follow two step directions with 80% accuracy and min A verbal/visual cues. SLP Short Term Goal 1 - Progress (Week 2): Met SLP Short Term Goal 2 (Week 2): Patient will demonstrate intellectual awareness during basic functional ADL's and speech-language tasks with Min A cues. SLP Short Term Goal 2 - Progress (Week 2): Met SLP Short Term Goal 3 (Week 2): Pt will verbally name common objects with 50% accuracy when provided Max A multimodal cues. SLP Short Term Goal 3 - Progress (Week 2): Met SLP Short Term Goal 4 (Week 2): Pt will communicate basic wants and needs via verbal or multimodal means with Min A cues. SLP Short Term Goal 4 - Progress (Week 2): Met SLP Short Term Goal 5 (Week 2): Pt will match words to common objects with 50% accuracy with Max A multimodal cues. SLP Short Term Goal 5 - Progress (Week 2): Progressing toward goal    New Short Term Goals: Week 3: SLP Short Term Goal 1 (Week 3): STG=LTG due to remaining length of stay  Weekly Progress Updates: Pt has made functional gains and met 4 out of 5 short term goals. Pt is currently Mod assist for word finding in structured language tasks, Max-Total A for reading and writing, and only Min A for word finding in familiar conversations and to communicate his basic wants and needs due to a mixed expressive receptive aphasia. Although aphasia is mixed, receptive deficits are much improved and only mild in nature now. He is also becoming more aware of his verbal errors, and communicating that even when he has difficulty  producing accurate corrections. Pt education is ongoing; family education is scheduled for 12/06/19 with primary caregiver (girlfriend). Pt would continue to benefit from skilled ST while inpatient in order to maximize functional independence and reduce burden of care prior to discharge. Anticipate that pt will need 24/7 supervision at discharge in addition to [facility] follow up at next level of care.       Intensity: Minumum of 1-2 x/day, 30 to 90 minutes Frequency: 3 to 5 out of 7 days Duration/Length of Stay: 12/08/19 Treatment/Interventions: Cognitive remediation/compensation;Multimodal communication approach;Speech/Language facilitation;Functional tasks;Cueing hierarchy;Internal/external aids;Patient/family education   Daily Session  Skilled Therapeutic Interventions: Pt was seen for skilled ST targeting communication goals. SLP facilitated session with various naming tasks targeting functional items within the home (ex: bathing/grooming items, kitchen and bedroom items, etc.). Pt named items with 85% accuracy when provided Max semantic cues and Mod phonemic cues. He is requiring fewer and fewer phonemic cues and able to rely more on word finding strategy of describing function and/or appearance of items to assist with verbalizing object names. He is also communicating in informal conversation about known topics (ex: discussed d/c plans and home life) with overall Min A cues for word finding and functional communication exchange. He is comprehending mildly complex language in conversation with no more than Min A cues as well. Pt left sitting in chair with alarm set and needs within reach. Continue per current plan of care.          Pain Pain Assessment Pain Scale: 0-10  Pain Score: 0-No pain  Therapy/Group: Individual Therapy  Arbutus Leas 11/30/2019, 12:06 PM

## 2019-11-30 NOTE — Telephone Encounter (Signed)
Lmom for pt to call us back so we can schedule an appointment for 3 to 4 months out.

## 2019-11-30 NOTE — Progress Notes (Signed)
Patient ID: Philip Stone, male   DOB: 05/17/1950, 69 y.o.   MRN: [id number] Met with the patient to review role of the nurse case manager and collaboration with the SW to prepare for discharge. Reviewed secondary stroke risks including HLD and tips for eating after a stroke, dyslipidemia, cooking with less salt and fat and cholesterol eating plan. Patient noted an understanding of the information reviewed and discussed questions that arose during the educational session. Sharp, Deborah B    

## 2019-11-30 NOTE — Progress Notes (Signed)
[place] PHYSICAL MEDICINE & REHABILITATION PROGRESS NOTE   Subjective/Complaints:  Pt has SAEBO unit on Right wrist /finger extensors No other issues overnite   ROS: Limited due to aphasia, but appears to deny CP, shortness of breath, nausea, vomiting, diarrhea.  Objective:   No results found. No results for input(s): WBC, HGB, HCT, PLT in the last 72 hours. No results for input(s): NA, K, CL, CO2, GLUCOSE, BUN, CREATININE, CALCIUM in the last 72 hours.  Intake/Output Summary (Last 24 hours) at 11/30/2019 0848 Last data filed at 11/30/2019 0811 Gross per 24 hour  Intake 600 ml  Output 550 ml  Net 50 ml     Physical Exam: Vital Signs Blood pressure (!) 99/53, pulse 62, temperature 97.8 F (36.6 C), temperature source Oral, resp. rate 15, SpO2 97 %.  General: No acute distress Mood and affect are appropriate Heart: Regular rate and rhythm no rubs murmurs or extra sounds Lungs: Clear to auscultation, breathing unlabored, no rales or wheezes Abdomen: Positive bowel sounds, soft nontender to palpation, nondistended Extremities: No clubbing, cyanosis, or edema Skin: No evidence of breakdown, no evidence of rash  Expressive aphasia LUE: 5/5 proximal distal LLE: Hip flexion, knee extension 4-4+/5, ankle dorsiflexion 1/5 RUE: Shoulder abduction 1/5, elbow flexion/extension, wrist extension 0/5, handgrip 3/5, overall limited due to apraxia, unchanged RLE: Hip flexion, knee extension 2+/5, ankle dorsiflexion 0/5, unchanged   Assessment/Plan:  1. 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  Physiatrist is providing close team supervision and 24 hour management of active medical problems listed below.  Physiatrist and rehab team continue to assess barriers to discharge/monitor patient  progress toward functional and medical goals  Care Tool:  Bathing  Bathing activity did not occur:  (patient refused) Body parts bathed by patient: Right arm, Chest, Abdomen, Front perineal area, Right upper leg, Left upper leg, Face, Left lower leg   Body parts bathed by helper: Left arm, Buttocks, Right lower leg     Bathing assist Assist Level: Moderate Assistance - Patient 50 - 74%     Upper Body Dressing/Undressing Upper body dressing   What is the patient wearing?: Pull over shirt    Upper body assist Assist Level: Moderate Assistance - Patient 50 - 74%    Lower Body Dressing/Undressing Lower body dressing      What is the patient wearing?: Pants     Lower body assist Assist for lower body dressing: Moderate Assistance - Patient 50 - 74%     Toileting Toileting    Toileting assist Assist for toileting: Moderate Assistance - Patient 50 - 74%     Transfers Chair/bed transfer  Transfers assist     Chair/bed transfer assist level: Minimal Assistance - Patient > 75%     Locomotion Ambulation   Ambulation assist      Assist level: Minimal Assistance - Patient > 75% Assistive device: Walker-platform Max distance: 60   Walk 10 feet activity   Assist     Assist level: Minimal Assistance - Patient > 75% Assistive device: Walker-rolling, Orthosis   Walk 50 feet activity   Assist Walk 50 feet with 2 turns activity did not occur: Safety/medical concerns  Assist level: Minimal Assistance - Patient > 75% Assistive device: Walker-rolling, Orthosis    Walk 150 feet activity   Assist Walk 150 feet activity  did not occur: Safety/medical concerns         Walk 10 feet on uneven surface  activity   Assist Walk 10 feet on uneven surfaces activity did not occur: Safety/medical concerns         Wheelchair     Assist Will patient use wheelchair at discharge?: Yes Type of Wheelchair: Manual    Wheelchair assist level:  Supervision/Verbal cueing Max wheelchair distance: 175    Wheelchair 50 feet with 2 turns activity    Assist        Assist Level: Supervision/Verbal cueing   Wheelchair 150 feet activity     Assist      Assist Level: Supervision/Verbal cueing   Blood pressure (!) 99/53, pulse 62, temperature 97.8 F (36.6 C), temperature source Oral, resp. rate 15, SpO2 97 %.  Medical Problem List and Plan: 1.  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  Antithrombotics: -DVT/anticoagulation: Lovenox             -antiplatelet therapy: Plavix 75 mg daily 3. Pain Management: Tylenol as needed No pain complaints 4. Mood: Provide emotional support             -antipsychotic agents: N/A 5. Neuropsych: This patient is capable of making decisions on his own behalf with accommodations for aphasia  6. Skin/Wound Care: Routine skin checks 7. Fluids/Electrolytes/Nutrition: Routine in and outs.    BMP within acceptable range on 10/17 8.  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  Follow-up outpatient 9.  Hyperlipidemia.    Continue Lipitor 10.  BPH.    Resumed tamsulosin  Improved 11.  Elevated blood pressure reading    Vitals:   11/29/19 2015 11/30/19 0519  BP: 117/72 (!) 99/53  Pulse: 65 62  Resp: 15 15  Temp: 98.2 F (36.8 C) 97.8 F (36.6 C)  SpO2: 99% 97%   Controlled on 10/21, on low side but asymp 12.  Slow transit constipation  Bowel meds increased on 10/18 13.  Thrombocytopenia,?  Chronic  Platelets 142 on 10/18, may need to hold enoxaparin if platelet count drops to less  than 100,000  Continue to monitor   LOS: 13 days A FACE TO FACE EVALUATION WAS PERFORMED  Charlett Blake 11/30/2019, 8:48 AM

## 2019-11-30 NOTE — Progress Notes (Addendum)
Occupational Therapy Session Note  Patient Details  Name: Philip Stone MRN: [id number] Date of Birth: [birth year]  Today's Date: 11/30/2019 OT Individual Time: 7322-0254 OT Individual Time Calculation (min): 38 min    Short Term Goals: Week 1:  OT Short Term Goal 1 (Week 1): Patient will complete functional transfers with Mod A and LRAD. OT Short Term Goal 1 - Progress (Week 1): Met OT Short Term Goal 2 (Week 1): Patient will don UB clothing with Min A and use of hemi technique. OT Short Term Goal 2 - Progress (Week 1): Met OT Short Term Goal 3 (Week 1): Patient will recall and demonstrate 3 visual compensatory strategies in prep for ADLs. OT Short Term Goal 3 - Progress (Week 1): Met OT Short Term Goal 4 (Week 1): Patient will thread BLE through LB clothing seated EOB. OT Short Term Goal 4 - Progress (Week 1): Progressing toward goal Week 2:  OT Short Term Goal 1 (Week 2): Patient will thread BLE through LB clothing seated EOB. OT Short Term Goal 2 (Week 2): Patient will don UB clothing with set-up assist utilizing hemi technique OT Short Term Goal 3 (Week 2): Patient will complete oral hygiene with use of RUE as a stabilizer. OT Short Term Goal 4 (Week 2): Patient Complete stand-pivot transfers with Min A and LRAD.  Skilled Therapeutic Interventions/Progress Updates:  Patient met lying supine in bed in agreement with OT treatment session. 0/10 c/o pain at rest and with activity. Continued profound sensory loss to RUE/RLE. Session with focus on self-care re-education and NMR. Patient declined bathing/dressing tasks this date but in agreement with grooming seated at sink level. Hand over hand assist and moderate cues for incorporation of RUE at gross assist level with focus on functional grasp/release. Saebo e-stim applied to R digit/wrist. Blocked digit/wrist extension for 2 sets x10 reps with hand over hand assist. Without hand over hand, patient very ataxic with inability to independently  control extension of wrist and digits. Session concluded with patient seated in wc with call bell within reach and all needs met. Saebo e-stim in place.   Therapy Documentation Precautions:  Precautions Precautions: Fall Required Braces or Orthoses: Other Brace Other Brace: bilateral AFO's in shoes Restrictions Weight Bearing Restrictions: No General:    Therapy/Group: Individual Therapy  [name] 11/30/2019, 8:45 AM

## 2019-12-01 ENCOUNTER — Inpatient Hospital Stay (HOSPITAL_COMMUNITY): Payer: PPO | Admitting: Speech Pathology

## 2019-12-01 ENCOUNTER — Inpatient Hospital Stay (HOSPITAL_COMMUNITY): Payer: PPO | Admitting: Occupational Therapy

## 2019-12-01 ENCOUNTER — Inpatient Hospital Stay (HOSPITAL_COMMUNITY): Payer: PPO | Admitting: Physical Therapy

## 2019-12-01 ENCOUNTER — Telehealth: Payer: Self-pay | Admitting: Internal Medicine

## 2019-12-01 LAB — CREATININE, SERUM
Creatinine, Ser: 1.04 mg/dL (ref 0.61–1.24)
GFR, Estimated: >60 mL/min (ref >60)

## 2019-12-01 NOTE — Telephone Encounter (Signed)
Pt's wife was returning a call from [place], [place]. She said patient had a severe stroke and would like to hold off on scheduling.

## 2019-12-01 NOTE — Progress Notes (Signed)
Physical Therapy Weekly Progress Note  Patient Details  Name: Philip Stone MRN: [id number] Date of Birth: 1950-02-19   Beginning of progress report period: November 24, 2019 End of progress report period: December 01, 2019  Today's Date: 12/01/2019 PT Individual Time: 0905-1005 PT Individual Time Calculation (min): 60 min   Patient has met 3 of 3 short term goals.  Pt is making steady progress towards LTG. Over the past week, pt has demonstrated improved awareness of the R side allowing gait with as little as min assist from PT and RW, min assist-mod assist stand pivot transfers, and supervision assist WC propulsion. Proprioceptive deficits and continued R visual field cut are this pt's greatest limiting factors.   Patient continues to demonstrate the following deficits muscle weakness, muscle joint tightness and muscle paralysis, decreased cardiorespiratoy endurance, unbalanced muscle activation, motor apraxia, ataxia, decreased coordination and decreased motor planning, decreased visual perceptual skills, field cut and hemianopsia, decreased attention to right and decreased sitting balance, decreased standing balance, decreased postural control, hemiplegia and decreased balance strategies and therefore will continue to benefit from skilled PT intervention to increase functional independence with mobility.  Patient progressing toward long term goals..  Continue plan of care.  PT Short Term Goals Week 2:  PT Short Term Goal 1 (Week 2): Pt will ambulate 15f with mad assist PT Short Term Goal 1 - Progress (Week 2): Met PT Short Term Goal 2 (Week 2): Pt will propell WC 1517fwth supervision assist PT Short Term Goal 2 - Progress (Week 2): Met PT Short Term Goal 3 (Week 2): Pt will ascend 4 steps with mod assist and LRAD PT Short Term Goal 3 - Progress (Week 2): Met Week 3:  PT Short Term Goal 1 (Week 3): STG=LTG due to ELOS  Skilled Therapeutic Interventions/Progress Updates:   Pt received  sitting in WC and agreeable to PT. WC mobility with supervision assist x 1709fcues from PT only for safety through tight space in hall.   Gait training with RW x 150f64fth min assist and min-mod cues for use of visual feedback to improve step length and width on the RLE as well as problem solving for AD management in turns to keep BLE within BOS  Dynamic balance training performed with cross body/lateral reaches x 8 BUE with min assist using RUE to maintain balance, and mod-moax assist with LUE to improved coordination of UE movement. Foot taps on 2 targets x 8 BLE with min assist moving the LLE and mod assist with RLE to improved trunk control lateral lean and coordination   Dynamic gait training in parallel bars forward/reverse 3 x 8ft 32fh min assist and min cues for improved step height in reverse on the RLE. Side stepping R and L 3x8ft w85f min assist and min-mod cues for step height on the R.   Patient returned to room and left sitting [facility] in reach and all needs met.        Therapy Documentation Precautions:  Precautions Precautions: Fall Required Braces or Orthoses: Other Brace Other Brace: bilateral AFO's in shoes Restrictions Weight Bearing Restrictions: No Pain:   denies  Therapy/Group: Individual Therapy  AustinLorie Phenix/2021, 10:10 AM

## 2019-12-01 NOTE — Progress Notes (Signed)
Occupational Therapy Session Note  Patient Details  Name: Philip Stone MRN: [id number] Date of Birth: 12-31-1950  Today's Date: 12/01/2019 OT Individual Time: 3825-0539 OT Individual Time Calculation (min): 60 min    Short Term Goals: Week 2:  OT Short Term Goal 1 (Week 2): Patient will thread BLE through LB clothing seated EOB. OT Short Term Goal 2 (Week 2): Patient will don UB clothing with set-up assist utilizing hemi technique OT Short Term Goal 3 (Week 2): Patient will complete oral hygiene with use of RUE as a stabilizer. OT Short Term Goal 4 (Week 2): Patient Complete stand-pivot transfers with Min A and LRAD.  Skilled Therapeutic Interventions/Progress Updates:  Patient met lying supine in bed in agreement with OT treatment session. 0/10 pain at rest and with activity. Patient indicating need for BM requiring Min A to stand from EOB with multimodal cues for hand placement, foot placement, and attention to R. Toilet transfer via stand-pivot from wc with assist for walker management. Patient able to maintain RUE on RW while using LUE to pull down underwear/pants with assist for R knee block and maintenance of balance. Patient also required assist to complete hygiene and pull pants/underwear up. Walk-in shower transfer via stand-pivot in same manner as above. Patient again abl to pull LB clothing down off hips and take off of legs/feet in sitting on shower chair. UB bathing with Min A and utilization of hemi technique to wash L arm. Patient also able to utilize affected RUE at gross assist level to hold bottle of soap. Hand over hand assist for pronation to put soap on towel. Patient also able to slightly abduct RUE to wash axilla with LUE and hold shower head in RUE to rinse body with hand over hand assist. Stand-pivot transfer to wc in same manner as above. Seated EOB, patient donned UB clothing with Min A. Patient continues to require assist to thread RLE through LB clothing but is able to  thread LLE. Assist to maintain standing balance to pull brief and pants over hips in standing with RUE supported on RW with orthosis. Session concluded with patient seated in wc with call bell within reach, belt alarm activated, and all needs met.   Therapy Documentation Precautions:  Precautions Precautions: Fall Required Braces or Orthoses: Other Brace Other Brace: bilateral AFO's in shoes Restrictions Weight Bearing Restrictions: No General:    Therapy/Group: Individual Therapy  Alaina Donati R Howerton-Davis 12/01/2019, 7:23 AM

## 2019-12-01 NOTE — Progress Notes (Signed)
[place] PHYSICAL MEDICINE & REHABILITATION PROGRESS NOTE   Subjective/Complaints:   Showered with OT, pt still c/o RIght side  Is weak, transfers with 1 person assist   ROS: Limited due to aphasia, but appears to deny CP, shortness of breath, nausea, vomiting, diarrhea.  Objective:   No results found. No results for input(s): WBC, HGB, HCT, PLT in the last 72 hours. Recent Labs    12/01/19 0453  CREATININE 1.04    Intake/Output Summary (Last 24 hours) at 12/01/2019 0826 Last data filed at 12/01/2019 0443 Gross per 24 hour  Intake 480 ml  Output 200 ml  Net 280 ml     Physical Exam: Vital Signs Blood pressure 115/61, pulse (!) 52, temperature 97.6 F (36.4 C), temperature source Oral, resp. rate 18, SpO2 96 %.  General: No acute distress Mood and affect are appropriate Heart: Regular rate and rhythm no rubs murmurs or extra sounds Lungs: Clear to auscultation, breathing unlabored, no rales or wheezes Abdomen: Positive bowel sounds, soft nontender to palpation, nondistended Extremities: No clubbing, cyanosis, or edema Skin: No evidence of breakdown, no evidence of rash  Expressive aphasia LUE: 5/5 proximal distal LLE: Hip flexion, knee extension 4-4+/5, ankle dorsiflexion 1/5 RUE: Shoulder abduction 1/5, elbow flexion/extension, wrist extension 0/5, handgrip 3/5, overall limited due to apraxia, unchanged RLE: Hip flexion, knee extension 2+/5, ankle dorsiflexion 0/5, unchanged   Assessment/Plan:  1. 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  Physiatrist is providing close team supervision and 24 hour management of active medical problems listed below.  Physiatrist and rehab team continue to assess barriers to discharge/monitor patient progress toward functional and medical  goals  Care Tool:  Bathing  Bathing activity did not occur:  (patient refused) Body parts bathed by patient: Right arm, Chest, Abdomen, Front perineal area, Right upper leg, Left upper leg, Face, Left arm   Body parts bathed by helper: Buttocks, Right lower leg, Left lower leg     Bathing assist Assist Level: Moderate Assistance - Patient 50 - 74%     Upper Body Dressing/Undressing Upper body dressing   What is the patient wearing?: Pull over shirt    Upper body assist Assist Level: Minimal Assistance - Patient > 75%    Lower Body Dressing/Undressing Lower body dressing      What is the patient wearing?: Pants, Incontinence brief     Lower body assist Assist for lower body dressing: Moderate Assistance - Patient 50 - 74%     Toileting Toileting    Toileting assist Assist for toileting: Moderate Assistance - Patient 50 - 74%     Transfers Chair/bed transfer  Transfers assist     Chair/bed transfer assist level: Minimal Assistance - Patient > 75%     Locomotion Ambulation   Ambulation assist      Assist level: Minimal Assistance - Patient > 75% Assistive device: Walker-platform Max distance: 60   Walk 10 feet activity   Assist     Assist level: Minimal Assistance - Patient > 75% Assistive device: Walker-rolling, Orthosis   Walk 50 feet activity   Assist Walk 50 feet with 2 turns activity did not occur: Safety/medical concerns  Assist level: Minimal Assistance - Patient > 75% Assistive device: Walker-rolling, Orthosis    Walk 150 feet activity   Assist Walk 150 feet activity  did not occur: Safety/medical concerns         Walk 10 feet on uneven surface  activity   Assist Walk 10 feet on uneven surfaces activity did not occur: Safety/medical concerns         Wheelchair     Assist Will patient use wheelchair at discharge?: Yes Type of Wheelchair: Manual    Wheelchair assist level: Supervision/Verbal cueing Max  wheelchair distance: 175    Wheelchair 50 feet with 2 turns activity    Assist        Assist Level: Supervision/Verbal cueing   Wheelchair 150 feet activity     Assist      Assist Level: Supervision/Verbal cueing   Blood pressure 115/61, pulse (!) 52, temperature 97.6 F (36.4 C), temperature source Oral, resp. rate 18, SpO2 96 %.  Medical Problem List and Plan: 1.  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  Antithrombotics: -DVT/anticoagulation: Lovenox             -antiplatelet therapy: Plavix 75 mg daily 3. Pain Management: Tylenol as needed No pain complaints 4. Mood: Provide emotional support             -antipsychotic agents: N/A 5. Neuropsych: This patient is capable of making decisions on his own behalf with accommodations for aphasia  6. Skin/Wound Care: Routine skin checks 7. Fluids/Electrolytes/Nutrition: Routine in and outs.    BMP within acceptable range on 10/17 8.  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  Follow-up outpatient 9.  Hyperlipidemia.    Continue Lipitor 10.  BPH.    Resumed tamsulosin  Improved 11.  Elevated blood pressure reading    Vitals:   11/30/19 1924 12/01/19 0446  BP: 118/79 115/61  Pulse: (!) 56 (!) 52  Resp: 18 18  Temp: 98 F (36.7 C) 97.6 F (36.4 C)  SpO2: 100% 96%   Controlled on 10/21, on low side but asymp Sinus brady  12.  Slow transit constipation  Bowel meds increased on 10/18 13.  Thrombocytopenia,?  Chronic  Platelets 142 on 10/18, may need to hold enoxaparin if platelet count drops to less than  100,000  Continue to monitor   LOS: 14 days A FACE TO FACE EVALUATION WAS PERFORMED  Charlett Blake 12/01/2019, 8:26 AM

## 2019-12-01 NOTE — Progress Notes (Signed)
Speech Language Pathology Daily Session Note  Patient Details  Name: Philip Stone MRN: [id number] Date of Birth: 24-Jun-1950  Today's Date: 12/01/2019 SLP Individual Time: 1300-1400 SLP Individual Time Calculation (min): 60 min  Short Term Goals: Week 3: SLP Short Term Goal 1 (Week 3): STG=LTG due to remaining length of stay  Skilled Therapeutic Interventions:  Pt was seen for skilled ST targeting goals for communication.  When asked openly about his ability to convey needs and wants to his caregivers, pt's response was very tangential, often perseverating on his girlfriend preparing meals for him at home.  With redirection, pt was able to identify "I know what I want to say, it just won't come out."  SLP facilitated the session with structured naming tasks to address expression at the word and short phrase level.  Pt identified objects in pictures with mod assist multimodal cues to recognize and correct verbal errors.  Pt was then able to describe actions in pictures or infer basic information (ie "what do you think is going to happen in this picture?") from pictures with min assist multimodal cues.  Pt was left in wheelchair with chair alarm set and call bell within reach.  Continue per current plan of care.    Pain Pain Assessment Pain Scale: 0-10 Pain Score: 0-No pain  Therapy/Group: Individual Therapy  Stephon Weathers, Selinda Orion 12/01/2019, 1:53 PM

## 2019-12-02 ENCOUNTER — Inpatient Hospital Stay (HOSPITAL_COMMUNITY): Payer: PPO

## 2019-12-02 ENCOUNTER — Inpatient Hospital Stay (HOSPITAL_COMMUNITY): Payer: PPO | Admitting: Physical Therapy

## 2019-12-02 ENCOUNTER — Other Ambulatory Visit: Payer: Self-pay

## 2019-12-02 MED ORDER — SENNOSIDES-DOCUSATE SODIUM 8.6-50 MG PO TABS
2.0000 | ORAL_TABLET | Freq: Every day | ORAL | Status: DC
  Filled 2019-12-02: qty 2

## 2019-12-02 NOTE — Progress Notes (Signed)
Occupational Therapy Session Note  Patient Details  Name: Blong Busk MRN: [id number] Date of Birth: 05/20/1950  Today's Date: 12/02/2019 OT Individual Time: 1420-1459 OT Individual Time Calculation (min): 39 min    Short Term Goals: Week 1:  OT Short Term Goal 1 (Week 1): Patient will complete functional transfers with Mod A and LRAD. OT Short Term Goal 1 - Progress (Week 1): Met OT Short Term Goal 2 (Week 1): Patient will don UB clothing with Min A and use of hemi technique. OT Short Term Goal 2 - Progress (Week 1): Met OT Short Term Goal 3 (Week 1): Patient will recall and demonstrate 3 visual compensatory strategies in prep for ADLs. OT Short Term Goal 3 - Progress (Week 1): Met OT Short Term Goal 4 (Week 1): Patient will thread BLE through LB clothing seated EOB. OT Short Term Goal 4 - Progress (Week 1): Progressing toward goal  Skilled Therapeutic Interventions/Progress Updates:     Pt received in w/c with 0 out of 10 pain  ADL: Pt grooms at sink seated with forced use of RUE and head turns to locate hand and hold toothpaste during bimanual task. Pt requires max VC for attention to RUE d/t sensoy motor deficits.   Neuromuscular Reeducation In prep for activity, OT provides PROM of ext rotators and wrist/digit extension since hand hnaturally rests in flexor pattern. Seated in w/c with mirror tilted in front of pt and OT hand on R scapula pt completes shoulder flexion 40*-extension 10* with resitance gripping handle with OT applying manual resistance for NMR after OT moves pt through ROM. Pt completes internal/external rotation in mirror as prep activity for greater range of shoulder flex.abduct. Pt completes gross grasp/release of digits with mirror for visual feedback and self ROM of digit extension to end range   Pt left at end of session in w/c with exit alarm on, call light in reach and all needs met   Therapy Documentation Precautions:  Precautions Precautions:  Fall Required Braces or Orthoses: Other Brace Other Brace: bilateral AFO's in shoes Restrictions Weight Bearing Restrictions: No General:   Vital Signs:  Pain:   ADL: ADL Grooming: Minimal assistance, Moderate cueing Where Assessed-Grooming: Sitting at sink Upper Body Bathing: Moderate assistance, Moderate cueing Where Assessed-Upper Body Bathing: Sitting at sink Lower Body Bathing: Moderate assistance Where Assessed-Lower Body Bathing: Sitting at sink Upper Body Dressing: Moderate assistance Where Assessed-Upper Body Dressing: Sitting at sink Lower Body Dressing: Maximal assistance Where Assessed-Lower Body Dressing: Sitting at sink Toileting: Moderate assistance Where Assessed-Toileting: Bedside Commode Toilet Transfer: Moderate assistance Toilet Transfer Method: Other (comment) Charlaine Dalton) Toilet Transfer Equipment: Art gallery manager    Praxis   Exercises:   Other Treatments:     Therapy/Group: Individual Therapy  Tonny Branch 12/02/2019, 12:59 PM

## 2019-12-02 NOTE — Progress Notes (Signed)
Physical Therapy Session Note  Patient Details  Name: Philip Stone MRN: [id number] Date of Birth: 01/29/1951  Today's Date: 12/02/2019 PT Individual Time: 0905-1005 PT Individual Time Calculation (min): 60 min   Short Term Goals: Week 3:  PT Short Term Goal 1 (Week 3): STG=LTG due to ELOS  Skilled Therapeutic Interventions/Progress Updates:   Pt received supine in bed and agreeable to PT. Supine>sit transfer with min assist and moderate cues for UE placement to push from bed rather than use of bed rail.    PT assisted pt to don Bil AFO and shoes. Sit<>stand from EOB with min assist to improve anterior weight shift. Stand pivot to [facility] with min assist to place RUE on RW and cues for AD management to keep BLE within BOS.   Pt transported to day room. Stand pivot to Nustep with min assist as listed above. Nustep reciprocal movement training x 12mnBUE/BLE with BLE support to keep contact with foot plate, R hand splint, and visual feedback on R knee to improve hip er/IR control with flexion. Noted improvement in knee control with increased time to neutral position.   Gait training with RW x 843fwith min assist overall and mod assist on this day for safe AD management in turns.  Pt required decreased cues of sequencing of gait pattern on this day compared to previous sessions.   WC mobility x 8021fo room with supervision assist and min cues for doorway management. Patient returned to room and [facility] reach and all needs met.        Therapy Documentation Precautions:  Precautions Precautions: Fall Required Braces or Orthoses: Other Brace Other Brace: bilateral AFO's in shoes Restrictions Weight Bearing Restrictions: No Pain:   denies   Therapy/Group: Individual Therapy  AusLorie Phenix/23/2021, 10:05 AM

## 2019-12-02 NOTE — Progress Notes (Signed)
[place] PHYSICAL MEDICINE & REHABILITATION PROGRESS NOTE   Subjective/Complaints: Patient seen laying in bed this morning.  He suggests he slept well overnight.  No reported issues overnight.  ROS: Limited due to aphasia  Objective:   No results found. No results for input(s): WBC, HGB, HCT, PLT in the last 72 hours. Recent Labs    12/01/19 0453  CREATININE 1.04    Intake/Output Summary (Last 24 hours) at 12/02/2019 1001 Last data filed at 12/02/2019 0804 Gross per 24 hour  Intake 620 ml  Output 575 ml  Net 45 ml     Physical Exam: Vital Signs Blood pressure 113/63, pulse (!) 53, temperature 97.7 F (36.5 C), resp. rate 17, SpO2 97 %. Constitutional: No distress . Vital signs reviewed. HENT: Normocephalic.  Atraumatic. Eyes: EOMI. No discharge. Cardiovascular: No JVD.  RRR. Respiratory: Normal effort.  No stridor.  Bilateral clear to auscultation. GI: Non-distended.  BS +. Skin: Warm and dry.  Intact. Psych: Normal mood.  Normal behavior. Musc: No edema in extremities.  No tenderness in extremities. Neuro: Alert Expressive > receptive aphasia LUE: 5/5 proximal distal LLE: Hip flexion, knee extension 4-4+/5, ankle dorsiflexion 1/5 RUE: Shoulder abduction 1/5, elbow flexion/extension, wrist extension 0/5, handgrip 3/5, overall limited due to apraxia, unchanged RLE: Hip flexion, knee extension 2+/5, ankle dorsiflexion 0/5, persistent  Assessment/Plan:  1. 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  Physiatrist is providing close team supervision and 24 hour management of active medical problems listed below.  Physiatrist and rehab team continue to assess barriers to discharge/monitor patient progress toward functional and medical goals  Care Tool:  Bathing  Bathing  activity did not occur:  (patient refused) Body parts bathed by patient: Right arm, Chest, Abdomen, Front perineal area, Right upper leg, Left upper leg, Face, Left arm   Body parts bathed by helper: Buttocks, Right lower leg, Left lower leg     Bathing assist Assist Level: Moderate Assistance - Patient 50 - 74%     Upper Body Dressing/Undressing Upper body dressing   What is the patient wearing?: Pull over shirt    Upper body assist Assist Level: Minimal Assistance - Patient > 75%    Lower Body Dressing/Undressing Lower body dressing      What is the patient wearing?: Pants, Incontinence brief     Lower body assist Assist for lower body dressing: Moderate Assistance - Patient 50 - 74%     Toileting Toileting    Toileting assist Assist for toileting: Moderate Assistance - Patient 50 - 74%     Transfers Chair/bed transfer  Transfers assist     Chair/bed transfer assist level: Minimal Assistance - Patient > 75%     Locomotion Ambulation   Ambulation assist      Assist level: Minimal Assistance - Patient > 75% Assistive device: Walker-platform Max distance: 60   Walk 10 feet activity   Assist     Assist level: Minimal Assistance - Patient > 75% Assistive device: Walker-rolling, Orthosis   Walk 50 feet activity   Assist Walk 50 feet with 2 turns activity did not occur: Safety/medical concerns  Assist level: Minimal Assistance - Patient > 75% Assistive device: Walker-rolling, Orthosis    Walk 150 feet activity   Assist Walk 150 feet activity did not occur: Safety/medical concerns  Walk 10 feet on uneven surface  activity   Assist Walk 10 feet on uneven surfaces activity did not occur: Safety/medical concerns         Wheelchair     Assist Will patient use wheelchair at discharge?: Yes Type of Wheelchair: Manual    Wheelchair assist level: Supervision/Verbal cueing Max wheelchair distance: 175    Wheelchair 50 feet  with 2 turns activity    Assist        Assist Level: Supervision/Verbal cueing   Wheelchair 150 feet activity     Assist      Assist Level: Supervision/Verbal cueing   Blood pressure 113/63, pulse (!) 53, temperature 97.7 F (36.5 C), resp. rate 17, SpO2 97 %.  Medical Problem List and Plan: 1.  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  Antithrombotics: -DVT/anticoagulation: Lovenox             -antiplatelet therapy: Plavix 75 mg daily 3. Pain Management: Tylenol as needed No pain complaints 4. Mood: Provide emotional support             -antipsychotic agents: N/A 5. Neuropsych: This patient is not fully capable of making decisions on his own behalf with accommodations for aphasia  6. Skin/Wound Care: Routine skin checks 7. Fluids/Electrolytes/Nutrition: Routine in and outs.    BMP within acceptable range on 10/17, labs ordered for Monday 8.  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  Follow-up outpatient 9.  Hyperlipidemia.    Continue Lipitor 10.  BPH.    Resumed tamsulosin  Controlled 11.  Elevated blood pressure reading    Vitals:   12/01/19 1930 12/02/19 0439  BP: 139/72 113/63  Pulse: 60 (!) 53  Resp: 16 17  Temp: 97.9 F (36.6 C) 97.7 F (36.5 C)  SpO2: 99% 97%   Relatively controlled on 10/23 12.  Slow transit constipation  Bowel meds increased on 10/18, increased again on 10/23 13.  Thrombocytopenia,?  Chronic  Platelets 142 on 10/18, labs ordered for Monday  Continue to monitor   LOS: 15 days A FACE TO FACE EVALUATION WAS PERFORMED  Gearldine Looney Lorie Phenix 12/02/2019, 10:01 AM

## 2019-12-03 ENCOUNTER — Inpatient Hospital Stay (HOSPITAL_COMMUNITY): Payer: PPO | Admitting: Physical Therapy

## 2019-12-03 ENCOUNTER — Inpatient Hospital Stay (HOSPITAL_COMMUNITY): Payer: PPO | Admitting: Occupational Therapy

## 2019-12-03 DIAGNOSIS — R001 Bradycardia, unspecified: Secondary | ICD-10-CM

## 2019-12-03 MED ORDER — SENNOSIDES-DOCUSATE SODIUM 8.6-50 MG PO TABS
2.0000 | ORAL_TABLET | Freq: Two times a day (BID) | ORAL | Status: DC
  Administered 2019-12-04 – 2019-12-08 (×5): 2 via ORAL
  Filled 2019-12-03 (×9): qty 2

## 2019-12-03 NOTE — Progress Notes (Signed)
Physical Therapy Session Note  Patient Details  Name: Philip Stone MRN: [id number] Date of Birth: 11/15/1950  Today's Date: 12/03/2019 PT Individual Time: 0800-0859 PT Individual Time Calculation (min): 59 min   Short Term Goals: Week 1:  PT Short Term Goal 1 (Week 1): Pt will propell WC 168f with min assist and hemi technique PT Short Term Goal 1 - Progress (Week 1): Met PT Short Term Goal 2 (Week 1): Pt will perform bed mobility with min assist PT Short Term Goal 2 - Progress (Week 1): Met PT Short Term Goal 3 (Week 1): [facility] Short Term Goal 3 - Progress (Week 1): Met PT Short Term Goal 4 (Week 1): Pt will ambulate 353fwith mod assist PT Short Term Goal 4 - Progress (Week 1): Met  Skilled Therapeutic Interventions/Progress Updates:  Pt was seen bedside in the am. Pt transferred supine to edge of bed with min A. Pt transferred edge of bed to supine with mod A. Pt rolled to assist with changing brief and pants with S. Pt transferred back to edge of bed with min to mod A. Pt transferred edge of bed to w/c with min A and verbal cues. Pt performed multiple transfers with min A and verbal cues. Pt performed step taps and AAROM-AROM B LEs. Therapist placed 2 lbs on R ankle and 2.5 lbs on L ankle to increase proprioceptive feedbacks. Pt ambulated 50 feet with rolling walker and min A with verbal cues. Pt ambulated 50 feet x 3 with rolling walker and min A with verbal cues. Pt appeared to demonstrated improved gait pattern wit weight on ankles. Pt returned to room following treatment and left sitting up in w/c with chair alarm on and all needs within reach.   Therapy Documentation Precautions:  Precautions Precautions: Fall Required Braces or Orthoses: Other Brace Other Brace: bilateral AFO's in shoes Restrictions Weight Bearing Restrictions: No General:   Pain: No c/o pain.   Therapy/Group: Individual Therapy  MiDub Amis0/24/2021, 12:21  PM

## 2019-12-03 NOTE — Progress Notes (Signed)
Occupational Therapy Session Note  Patient Details  Name: Philip Stone MRN: [id number] Date of Birth: July 30, 1950  Today's Date: 12/03/2019 OT Individual Time: 1448-1530 OT Individual Time Calculation (min): 42 min   Short Term Goals: Week 2:  OT Short Term Goal 1 (Week 2): Patient will thread BLE through LB clothing seated EOB. OT Short Term Goal 2 (Week 2): Patient will don UB clothing with set-up assist utilizing hemi technique OT Short Term Goal 3 (Week 2): Patient will complete oral hygiene with use of RUE as a stabilizer. OT Short Term Goal 4 (Week 2): Patient Complete stand-pivot transfers with Min A and LRAD.  Skilled Therapeutic Interventions/Progress Updates:    Pt greeted in the w/c with no c/o pain. ADL needs met. Motivated to work on his Rt side. Pt was escorted to the dayroom and started by guiding him through towel slide exercises with the Rt UE, manual facilitation for elbow extension and flat palm due to flexor patterning. Vcs for turning head to increase visual attendance to affected UE during movement. He was able to fully extend digits when concentrating however flexor patterns increased during full arm active assist movement and also when standing afterwards to complete mini squats. He completed 3 sets 5-10 reps of mini squats in front of elevated table, focusing on flat palm with facilitation, Rt sided weightbearing, and Rt knee control. CGA for balance at this time. At end of session pt was returned to the room via w/c, left sitting up with all needs within reach and safety belt fastened.   Therapy Documentation Precautions:  Precautions Precautions: Fall Required Braces or Orthoses: Other Brace Other Brace: bilateral AFO's in shoes Restrictions Weight Bearing Restrictions: No Vital Signs: Therapy Vitals Temp: 98.5 F (36.9 C) Pulse Rate: 63 Resp: 17 BP: 124/79 Patient Position (if appropriate): Sitting Oxygen Therapy SpO2: 100 % O2 Device: Room  Air ADL: ADL Grooming: Minimal assistance, Moderate cueing Where Assessed-Grooming: Sitting at sink Upper Body Bathing: Moderate assistance, Moderate cueing Where Assessed-Upper Body Bathing: Sitting at sink Lower Body Bathing: Moderate assistance Where Assessed-Lower Body Bathing: Sitting at sink Upper Body Dressing: Moderate assistance Where Assessed-Upper Body Dressing: Sitting at sink Lower Body Dressing: Maximal assistance Where Assessed-Lower Body Dressing: Sitting at sink Toileting: Moderate assistance Where Assessed-Toileting: Bedside Commode Toilet Transfer: Moderate assistance Toilet Transfer Method: Other (comment) Charlaine Dalton) Toilet Transfer Equipment: Bedside commode      Therapy/Group: Individual Therapy  Gaige Fussner A Roniesha Hollingshead 12/03/2019, 3:50 PM

## 2019-12-03 NOTE — Progress Notes (Signed)
[facility] PHYSICAL MEDICINE & REHABILITATION PROGRESS NOTE   Subjective/Complaints: Patient seen laying in bed this morning.  He states he slept well overnight.  He denies complaints.  No reported issues overnight.    ROS: Limited due to aphasia  Objective:   No results found. No results for input(s): WBC, HGB, HCT, PLT in the last 72 hours. Recent Labs    12/01/19 0453  CREATININE 1.04    Intake/Output Summary (Last 24 hours) at 12/03/2019 0910 Last data filed at 12/03/2019 0858 Gross per 24 hour  Intake 720 ml  Output 675 ml  Net 45 ml     Physical Exam: Vital Signs Blood pressure 107/86, pulse (!) 57, temperature 97.8 F (36.6 C), resp. rate 18, height 5\' 10"  (1.778 m), weight 82 kg, SpO2 96 %. Constitutional: No distress . Vital signs reviewed. HENT: Normocephalic.  Atraumatic. Eyes: EOMI. No discharge. Cardiovascular: No JVD.  RRR. Respiratory: Normal effort.  No stridor.  Bilateral clear to auscultation. GI: Non-distended.  BS +. Skin: Warm and dry.  Intact. Psych: Normal mood.  Normal behavior. Musc: No edema in extremities.  No tenderness in extremities. Neuro: Alert Expressive > receptive aphasia, unchanged LUE: 5/5 proximal distal LLE: Hip flexion, knee extension 4-4+/5, ankle dorsiflexion 1/5 RUE: Shoulder abduction 1/5, elbow flexion/extension, wrist extension 0/5, handgrip 3/5, overall limited due to apraxia, stable RLE: Hip flexion, knee extension 2+/5, ankle dorsiflexion 0/5, persistent  Assessment/Plan:  1. 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  Physiatrist is providing close team supervision and 24 hour management of active medical problems listed below.  Physiatrist and rehab team continue to assess barriers to discharge/monitor patient progress toward  functional and medical goals  Care Tool:  Bathing  Bathing activity did not occur:  (patient refused) Body parts bathed by patient: Right arm, Chest, Abdomen, Front perineal area, Right upper leg, Left upper leg, Face, Left arm   Body parts bathed by helper: Buttocks, Right lower leg, Left lower leg     Bathing assist Assist Level: Moderate Assistance - Patient 50 - 74%     Upper Body Dressing/Undressing Upper body dressing   What is the patient wearing?: Pull over shirt    Upper body assist Assist Level: Minimal Assistance - Patient > 75%    Lower Body Dressing/Undressing Lower body dressing      What is the patient wearing?: Pants, Incontinence brief     Lower body assist Assist for lower body dressing: Moderate Assistance - Patient 50 - 74%     Toileting Toileting    Toileting assist Assist for toileting: Moderate Assistance - Patient 50 - 74%     Transfers Chair/bed transfer  Transfers assist     Chair/bed transfer assist level: Minimal Assistance - Patient > 75%     Locomotion Ambulation   Ambulation assist      Assist level: Minimal Assistance - Patient > 75% Assistive device: Walker-platform Max distance: 60   Walk 10 feet activity   Assist     Assist level: Minimal Assistance - Patient > 75% Assistive device: Walker-rolling, Orthosis   Walk 50 feet activity   Assist Walk 50 feet with 2 turns activity did not occur: Safety/medical concerns  Assist level: Minimal Assistance - Patient > 75% Assistive device: Walker-rolling, Orthosis    Walk 150 feet activity  Assist Walk 150 feet activity did not occur: Safety/medical concerns         Walk 10 feet on uneven surface  activity   Assist Walk 10 feet on uneven surfaces activity did not occur: Safety/medical concerns         Wheelchair     Assist Will patient use wheelchair at discharge?: Yes Type of Wheelchair: Manual    Wheelchair assist level:  Supervision/Verbal cueing Max wheelchair distance: 175    Wheelchair 50 feet with 2 turns activity    Assist        Assist Level: Supervision/Verbal cueing   Wheelchair 150 feet activity     Assist      Assist Level: Supervision/Verbal cueing   Blood pressure 107/86, pulse (!) 57, temperature 97.8 F (36.6 C), resp. rate 18, height 5\' 10"  (1.778 m), weight 82 kg, SpO2 96 %.  Medical Problem List and Plan: 1.  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  Antithrombotics: -DVT/anticoagulation: Lovenox             -antiplatelet therapy: Plavix 75 mg daily 3. Pain Management: Tylenol as needed No pain complaints 4. Mood: Provide emotional support             -antipsychotic agents: N/A 5. Neuropsych: This patient is not fully capable of making decisions on his own behalf with accommodations for aphasia  6. Skin/Wound Care: Routine skin checks 7. Fluids/Electrolytes/Nutrition: Routine in and outs.    BMP within acceptable range on 10/17, labs ordered for tomorrow 8.  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  Follow-up outpatient 9.  Hyperlipidemia.    Continue Lipitor 10.  BPH.    Resumed tamsulosin  Controlled 11.  Elevated blood pressure reading    Vitals:   12/02/19 1937 12/03/19 0528  BP: 113/69 107/86  Pulse: (!) 57 (!) 57  Resp: 20 18  Temp: 98.1 F (36.7 C) 97.8 F (36.6 C)  SpO2: 98% 96%   Controlled on 10/24 with mild bradycardia, continue to monitor 12.  Slow transit constipation  Bowel meds increased on 10/18, increased again on 10/23, increased again on 10/24 13.  Thrombocytopenia,?  Chronic  Platelets 142 on  10/18, labs ordered for tomorrow  Continue to monitor   LOS: 16 days A FACE TO FACE EVALUATION WAS PERFORMED  Aza Dantes Lorie Phenix 12/03/2019, 9:10 AM

## 2019-12-04 ENCOUNTER — Inpatient Hospital Stay (HOSPITAL_COMMUNITY): Payer: PPO | Admitting: Occupational Therapy

## 2019-12-04 ENCOUNTER — Inpatient Hospital Stay (HOSPITAL_COMMUNITY): Payer: PPO | Admitting: Speech Pathology

## 2019-12-04 ENCOUNTER — Inpatient Hospital Stay (HOSPITAL_COMMUNITY): Payer: PPO | Admitting: Physical Therapy

## 2019-12-04 LAB — CBC WITH DIFFERENTIAL/PLATELET
Abs Immature Granulocytes: 0.03 10*3/uL (ref 0.00–0.07)
Basophils Absolute: 0 10*3/uL (ref 0.0–0.1)
Basophils Relative: 1 %
Eosinophils Absolute: 0.2 10*3/uL (ref 0.0–0.5)
Eosinophils Relative: 4 %
HCT: 40.5 % (ref 39.0–52.0)
Hemoglobin: 13.5 g/dL (ref 13.0–17.0)
Immature Granulocytes: 1 %
Lymphocytes Relative: 21 %
Lymphs Abs: 1.1 10*3/uL (ref 0.7–4.0)
MCH: 29 pg (ref 26.0–34.0)
MCHC: 33.3 g/dL (ref 30.0–36.0)
MCV: 86.9 fL (ref 80.0–100.0)
Monocytes Absolute: 0.8 10*3/uL (ref 0.1–1.0)
Monocytes Relative: 14 %
Neutro Abs: 3.3 10*3/uL (ref 1.7–7.7)
Neutrophils Relative %: 59 %
Platelets: 163 10*3/uL (ref 150–400)
RBC: 4.66 MIL/uL (ref 4.22–5.81)
RDW: 12.9 % (ref 11.5–15.5)
WBC: 5.5 10*3/uL (ref 4.0–10.5)
nRBC: 0 % (ref 0.0–0.2)

## 2019-12-04 LAB — BASIC METABOLIC PANEL
Anion gap: 8 (ref 5–15)
BUN: 14 mg/dL (ref 8–23)
CO2: 25 mmol/L (ref 22–32)
Calcium: 9.2 mg/dL (ref 8.9–10.3)
Chloride: 105 mmol/L (ref 98–111)
Creatinine, Ser: 1.07 mg/dL (ref 0.61–1.24)
GFR, Estimated: >60 mL/min (ref >60)
Glucose, Bld: 105 mg/dL — ABNORMAL HIGH (ref 70–99)
Potassium: 4.3 mmol/L (ref 3.5–5.1)
Sodium: 138 mmol/L (ref 135–145)

## 2019-12-04 NOTE — Progress Notes (Signed)
Orthopedic Tech Progress Note Patient Details:  Philip Stone [birth year] [id number] Called in order to [facility] for an AFO CONSULT Patient ID: Philip Stone, male   DOB: [birth year], 69 y.o.   MRN: [id number]   Janit Pagan 12/04/2019, 2:26 PM

## 2019-12-04 NOTE — Progress Notes (Signed)
[facility] PHYSICAL MEDICINE & REHABILITATION PROGRESS NOTE   Subjective/Complaints: " can't say what is in my head"  ROS: Limited due to aphasia  Objective:   No results found. Recent Labs    12/04/19 0728  WBC 5.5  HGB 13.5  HCT 40.5  PLT 163   Recent Labs    12/04/19 0728  NA 138  K 4.3  CL 105  CO2 25  GLUCOSE 105*  BUN 14  CREATININE 1.07  CALCIUM 9.2    Intake/Output Summary (Last 24 hours) at 12/04/2019 0953 Last data filed at 12/04/2019 0800 Gross per 24 hour  Intake 660 ml  Output 675 ml  Net -15 ml     Physical Exam: Vital Signs Blood pressure 109/60, pulse (!) 51, temperature 97.8 F (36.6 C), temperature source Oral, resp. rate 16, height 5\' 10"  (1.778 m), weight 82 kg, SpO2 98 %.  General: No acute distress Mood and affect are appropriate Heart: Regular rate and rhythm no rubs murmurs or extra sounds Lungs: Clear to auscultation, breathing unlabored, no rales or wheezes Abdomen: Positive bowel sounds, soft nontender to palpation, nondistended Extremities: No clubbing, cyanosis, or edema Skin: No evidence of breakdown, no evidence of rash   Neuro: Alert Expressive > receptive aphasia, unchanged LUE: 5/5 proximal distal LLE: Hip flexion, knee extension 4-4+/5, ankle dorsiflexion 1/5 RUE: Shoulder abduction 1/5, elbow flexion/extension, wrist extension 0/5, handgrip 3/5, overall limited due to apraxia, stable RLE: Hip flexion, knee extension 2+/5, ankle dorsiflexion 0/5, persistent  Assessment/Plan:  1. 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  Physiatrist is providing close team supervision and 24 hour management of active medical problems listed below.  Physiatrist and rehab team continue to assess barriers to discharge/monitor patient progress  toward functional and medical goals  Care Tool:  Bathing  Bathing activity did not occur:  (patient refused) Body parts bathed by patient: Right arm, Chest, Abdomen, Front perineal area, Right upper leg, Left upper leg, Face, Left arm   Body parts bathed by helper: Buttocks, Right lower leg, Left lower leg     Bathing assist Assist Level: Moderate Assistance - Patient 50 - 74%     Upper Body Dressing/Undressing Upper body dressing   What is the patient wearing?: Pull over shirt    Upper body assist Assist Level: Minimal Assistance - Patient > 75%    Lower Body Dressing/Undressing Lower body dressing      What is the patient wearing?: Pants, Incontinence brief     Lower body assist Assist for lower body dressing: Moderate Assistance - Patient 50 - 74%     Toileting Toileting    Toileting assist Assist for toileting: Moderate Assistance - Patient 50 - 74%     Transfers Chair/bed transfer  Transfers assist     Chair/bed transfer assist level: Minimal Assistance - Patient > 75%     Locomotion Ambulation   Ambulation assist      Assist level: Minimal Assistance - Patient > 75% Assistive device: Orthosis Max distance: 50   Walk 10 feet activity   Assist     Assist level: Minimal Assistance - Patient > 75% Assistive device: Walker-rolling, Orthosis   Walk 50 feet activity   Assist Walk 50 feet with 2 turns activity did not occur: Safety/medical concerns  Assist level: Minimal Assistance - Patient >  75% Assistive device: Walker-rolling, Orthosis    Walk 150 feet activity   Assist Walk 150 feet activity did not occur: Safety/medical concerns         Walk 10 feet on uneven surface  activity   Assist Walk 10 feet on uneven surfaces activity did not occur: Safety/medical concerns         Wheelchair     Assist Will patient use wheelchair at discharge?: Yes Type of Wheelchair: Manual    Wheelchair assist level:  Supervision/Verbal cueing Max wheelchair distance: 175    Wheelchair 50 feet with 2 turns activity    Assist        Assist Level: Supervision/Verbal cueing   Wheelchair 150 feet activity     Assist      Assist Level: Supervision/Verbal cueing   Blood pressure 109/60, pulse (!) 51, temperature 97.8 F (36.6 C), temperature source Oral, resp. rate 16, height 5\' 10"  (1.778 m), weight 82 kg, SpO2 98 %.  Medical Problem List and Plan: 1.  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  Antithrombotics: -DVT/anticoagulation: Lovenox             -antiplatelet therapy: Plavix 75 mg daily 3. Pain Management: Tylenol as needed No pain complaints 4. Mood: Provide emotional support             -antipsychotic agents: N/A 5. Neuropsych: This patient is not fully capable of making decisions on his own behalf with accommodations for aphasia  6. Skin/Wound Care: Routine skin checks 7. Fluids/Electrolytes/Nutrition: Routine in and outs.    BMP within acceptable range on 10/17, labs ordered for tomorrow 8.  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  Follow-up outpatient 9.  Hyperlipidemia.    Continue Lipitor 10.  BPH.    Resumed tamsulosin  Controlled 11.  Elevated blood pressure reading    Vitals:   12/03/19 1937 12/04/19 0526  BP: 125/75 109/60  Pulse: 60 (!) 51  Resp: 18 16  Temp: 97.7 F (36.5 C) 97.8 F (36.6 C)  SpO2: 100% 98%   Controlled on 10/25 with asympt brady 12.  Slow transit constipation  Bowel meds increased on 10/18, increased again on 10/23, increased again on 10/24 13.  Thrombocytopenia,improved , 163K back in  normal range  Continue to monitor   LOS: 17 days A FACE TO FACE EVALUATION WAS PERFORMED  Philip Stone 12/04/2019, 9:53 AM

## 2019-12-04 NOTE — Progress Notes (Signed)
Speech Language Pathology Daily Session Note  Patient Details  Name: Philip Stone MRN: [id number] Date of Birth: 09/22/1950  Today's Date: 12/04/2019 SLP Individual Time: 6720-9470 SLP Individual Time Calculation (min): 58 min  Short Term Goals: Week 3: SLP Short Term Goal 1 (Week 3): STG=LTG due to remaining length of stay  Skilled Therapeutic Interventions: Pt was seen for skilled ST targeting communication goals. Pt required Min A verbal and visual cues for comprehension of directions to assist with repositioning in bed to sitting upright and also to comprehend we would be staying in bed for session (as opposed to getting in chair). Pt with improved accuracy and response to cues (semantic and phonemic) during phrase completion task today in comparison to last time it was targeted; overall Mod A provided. He also demonstrated slight improvement in writing ability. He generated his first and last name with 95% accuracy. Able to generate portions of other words, otherwise requiring dictation or most commonly trace cues for certain letters. Pt left laying in bed with alarm set and needs within reach. Continue per current plan of care.        Pain Pain Assessment Pain Scale: 0-10 Pain Score: 0-No pain  Therapy/Group: Individual Therapy  Arbutus Leas 12/04/2019, 12:17 PM

## 2019-12-04 NOTE — Progress Notes (Signed)
Physical Therapy Session Note  Patient Details  Name: Tarrin Lebow MRN: [id number] Date of Birth: 1950-12-04  Today's Date: 12/04/2019 PT Individual Time: 1050-1205 PT Individual Time Calculation (min): 75 min   Short Term Goals: Week 3:  PT Short Term Goal 1 (Week 3): STG=LTG due to ELOS  Skilled Therapeutic Interventions/Progress Updates:  Pt presented in bed agreeable to therapy. Pt denies pain. Pt performed bed mobility with minA and use of bed features. PTA donned shoes and AFO total A for time management, pt noted to have older boots on states feels more comfortable "less stiff" than shoes pt has been practicing in. Performed stand pivot to w/c with minA and verbal cues for sequencing. Pt transported to day room for energy conservation. Pt ambulated 9f x 2 with minA and verbal cues for pacing. No significant notable change in gait mechanics between old shoes and previously used "newer" ones. Pt ambulated additional 370fminA with intermittent cues for safety with RW. Pt then transported to rehab gym and discussed home entry set up. Per pt, had ramp installed sent message to [facility] verify with SO. Pt did participated in step ups with 6in step x 5 with L rail for general strengthening and coordination. Pt was able to perform with minA for RLE placement when stepping down. Pt then transferred to mat and performed block practice bed mobility supine to sidelying to sitting. Pt performed rolling supine to sit to R and then pushing up with LUE. Pt initially minA progressing to CGA from high/low mat with intermittent verbal cues. Discussed for remaining days performing bed mobility supine/sidelying/sit for continued practice prior to d/c. Pt then performed toe taps to target x 10 for forced use of RLE and coordination. Pt then performed ambulatory transfer to w/c with RW and minA and propelled back to room supervision via hemi technique. Pt left in w/c at end of session with belt alarm on, call  bell within reach and needs met.       Therapy Documentation Precautions:  Precautions Precautions: Fall Required Braces or Orthoses: Other Brace Other Brace: bilateral AFO's in shoes Restrictions Weight Bearing Restrictions: No General:   Vital Signs: Therapy Vitals Temp: 98.1 F (36.7 C) Pulse Rate: (!) 104 Resp: 19 BP: 115/86 Patient Position (if appropriate): Sitting Oxygen Therapy SpO2: 98 % O2 Device: Room Air Pain: Pain Assessment Pain Scale: 0-10 Pain Score: 0-No pain Mobility:   Locomotion :    Trunk/Postural Assessment :    Balance:   Exercises:   Other Treatments:      Therapy/Group: Individual Therapy  Luciann Gossett 12/04/2019, 4:16 PM

## 2019-12-04 NOTE — Progress Notes (Signed)
Occupational Therapy Session Note  Patient Details  Name: Ziare Cryder MRN: [id number] Date of Birth: November 09, 1950  Today's Date: 12/04/2019 OT Individual Time: 1400-1458 OT Individual Time Calculation (min): 58 min    Short Term Goals: Week 2:  OT Short Term Goal 1 (Week 2): Patient will thread BLE through LB clothing seated EOB. OT Short Term Goal 2 (Week 2): Patient will don UB clothing with set-up assist utilizing hemi technique OT Short Term Goal 3 (Week 2): Patient will complete oral hygiene with use of RUE as a stabilizer. OT Short Term Goal 4 (Week 2): Patient Complete stand-pivot transfers with Min A and LRAD.  Skilled Therapeutic Interventions/Progress Updates:  Patient met seated in wc in agreement with OT treatment session. 0/10 pain at rest and with activity. Wc mobility from room <> dayroom with use of hemi technique and occasional cues to avoid obstacles in hallway. Therapeutic activity with focus on visual scanning with clothes pins. Patient then able to grasp clothes pins in R hand and place in bucket with assist to flex shoulder and extend elbow. Patient noting increased sensation to RUE, chest and back compared to day of initial eval. Sit to stand from wc and stand-pivot transfer to mat table with Min A and use of RW. Dynamic sitting balance in prep for self-care tasks with reaching/bending outside of BOS in prep for BADLs. Session concluded with patient seated in wc with call bell within reach, belt alarm activated, and all needs met.   Therapy Documentation Precautions:  Precautions Precautions: Fall Required Braces or Orthoses: Other Brace Other Brace: bilateral AFO's in shoes Restrictions Weight Bearing Restrictions: No General:    Therapy/Group: Individual Therapy  Laquinton Bihm R Howerton-Davis 12/04/2019, 12:06 PM

## 2019-12-05 ENCOUNTER — Inpatient Hospital Stay (HOSPITAL_COMMUNITY): Payer: PPO | Admitting: Physical Therapy

## 2019-12-05 ENCOUNTER — Inpatient Hospital Stay (HOSPITAL_COMMUNITY): Payer: PPO | Admitting: Speech Pathology

## 2019-12-05 ENCOUNTER — Inpatient Hospital Stay (HOSPITAL_COMMUNITY): Payer: PPO | Admitting: Occupational Therapy

## 2019-12-05 NOTE — Progress Notes (Signed)
Speech Language Pathology Daily Session Note  Patient Details  Name: Philip Stone MRN: [id number] Date of Birth: [birth year]  Today's Date: 12/05/2019 SLP Individual Time: 1100-1125 SLP Individual Time Calculation (min): 25 min  Short Term Goals: Week 3: SLP Short Term Goal 1 (Week 3): STG=LTG due to remaining length of stay  Skilled Therapeutic Interventions: Pt was seen for skilled ST targeting language goals. SLP factilitated session with structured tasks targeting object identification, naming, and reading at word level. Pt matched words to objects (line drawing representation) from field of 2 with 9/12 accuracy, which is significantly improved since last targeted. Despite being unable to read aloud, his ability to match most words to objects support improvements in receptive language and reading abilities. He also verbally named objects with 85% accuracy with Min-Mod A semantic and occasionally phonemic cues. Pt left sitting in wheelchair with alarm set and needs within reach. Continue per current plan of care.          Pain Pain Assessment Pain Scale: 0-10 Pain Score: 0-No pain  Therapy/Group: Individual Therapy  Arbutus Leas 12/05/2019, 11:31 AM

## 2019-12-05 NOTE — Progress Notes (Signed)
Physical Therapy Session Note  Patient Details  Name: Philip Stone MRN: [id number] Date of Birth: 05-12-1950  Today's Date: 12/05/2019 PT Individual Time: 5701-7793 PT Individual Time Calculation (min): 74 min   Short Term Goals: Week 3:  PT Short Term Goal 1 (Week 3): STG=LTG due to ELOS  Skilled Therapeutic Interventions/Progress Updates: Pt presented in w/c agreeable to therapy. Pt denies pain during session. Pt transported to day room and participate din gait training with RW 30f and kicking yoga block in place for emphasis on R knee flexion. Chris from [employer] arrived and participated in additional ambulation [id number] [id number] with overall minA nearing CGA with verbal cues for sequencing for turns. Per discussion will replace AFO and will receive toe caps on both new boots and old boots with old boot taken today. Pt then indicated that SO took "new" boots home yesterday so returned to room to call SO and was agreeable to return boots today when visits pt. Pt then transported back to dayroom and performed squat pivot transfer CGA to NuStep. Pt participated in NuStep L5 x 12 min for general conditioning with R hand orthotic in place and verbal cues to maintain neutral RLE. Pt able to maintain avg 40-60 SPM. Pt returned to w/c in same manner as prior and transported to ortho gym where he participated in [employer] tracking/scanning. Participated in Mode A x 3 min with avg 2.22sec response speed with breakdown as follows: LUQ 1.58 sec, LLQ 1.77 sec, RUQ 2.9 sec, RLQ 3.04 sec. Pthen transported back to room and remained in w/c with belt alarm on, call bell within reach and needs met.      Therapy Documentation Precautions:  Precautions Precautions: Fall Required Braces or Orthoses: Other Brace Other Brace: bilateral AFO's in shoes Restrictions Weight Bearing Restrictions: No General:   Vital Signs: Therapy Vitals Temp: 98 F (36.7 C) Pulse Rate: (!) 58 Resp: 18 BP: 103/85 Patient  Position (if appropriate): Sitting Oxygen Therapy SpO2: 96 % O2 Device: Room Air    Therapy/Group: Individual Therapy  Jacara Benito  Chara Marquard, PTA  12/05/2019, 4:02 PM

## 2019-12-05 NOTE — Progress Notes (Signed)
Physical Therapy Session Note  Patient Details  Name: Philip Stone MRN: [id number] Date of Birth: December 13, 1950  Today's Date: 12/05/2019 PT Individual Time: 0908-0950 PT Individual Time Calculation (min): 42 min   Short Term Goals: Week 3:  PT Short Term Goal 1 (Week 3): STG=LTG due to ELOS  Skilled Therapeutic Interventions/Progress Updates:   Pt received sitting in w/c and agreeable to therapy session. Transported to/from gym in w/c for time management and energy conservation. Sit<>stands with CGA/min assist using RW - assist and cuing for R hand placement on/off orthotic. Gait training ~126ft x2 (seated break between) using RW with min assist primarily when not clearing R foot during swing causing minor anterior LOB - demos lack of adequate R hamstring activation during swing - able to turn R with min assist but requires up to mod assist when turning L due to impaired R step and poorer AD management - pt does really well with external target of orange theraband on R front of RW - during gait pt started with step-to pattern leading with R LE progressing to reciprocal pattern with increased distance and cuing. Seated R LE heel slides for hamstring activation with pt demoing good movement x15 reps. R LE NMR at stairs with B HR support via R foot taps on/off 1step 6" height step focusing on motor planning for increased R hip/knee flexion activation to improve swing phase - min assist for balance and facilitation for improved R LE motor recruitment. Gait training ~17ft using RW with min assist targeting carryover of R LE NMR training with pt demoing improved motor recruitment and timing of hamstring activation towards beginning of swing phase but still not fully sufficient. Transported back to room and left seated in w/c with needs in reach and seat belt alarm on.   Therapy Documentation Precautions:  Precautions Precautions: Fall Required Braces or Orthoses: Other Brace Other Brace: bilateral AFO's  in shoes Restrictions Weight Bearing Restrictions: No   Pain:  Denies pain during session.   Therapy/Group: Individual Therapy  Tawana Scale , PT, DPT, CSRS  12/05/2019, 7:55 AM

## 2019-12-05 NOTE — Progress Notes (Signed)
Occupational Therapy Weekly Progress Note  Patient Details  Name: Philip Stone MRN: [id number] Date of Birth: 09/16/1950  Beginning of progress report period: November 27, 2019 End of progress report period: December 05, 2019  Today's Date: 12/05/2019 OT Individual Time: 0731-0830 OT Individual Time Calculation (min): 59 min    Patient has met 2 of 4 short term goals.  Upon initial evaluation, patient required mod to max assist to don UB clothing, Max A to don LB clothing, and total A to don footwear. Patient currently demonstrates ability to doff/don UB clothing with supervision A and Min to Mod question cues for hemi technique. Patient demonstrates ability to complete oral hygiene seated at sink level with use of RUE to grasp cup and bring to mouth to rinse with hand over hand assist and Mod cues. Upon initial evaluation, patient was requiring Max A for functional transfers. Patient currently demonstrates stand-pivot transfers with use of RW and Min A at best and Mod A at most with moderate multimodal cues for sequencing, R attention, and hand over hand assist to maintain position of RUE on orthotic. Patient continues to require Mod A to don LB clothing 2/2 sensory/motor deficits and decreased ROM in R knee (baseline).   Patient continues to demonstrate the following deficits: weakness and muscle paralysis, impaired timing and sequencing, abnormal tone, unbalanced muscle activation, motor apraxia, decreased coordination and decreased motor planning, R hemianopsia, decreased attention to right, decreased initiation, decreased problem solving, decreased safety awareness, decreased memory and delayed processing and therefore will continue to benefit from skilled OT intervention to enhance overall performance with BADL and Reduce care partner burden.  Patient progressing toward long term goals..  Continue plan of care.  OT Short Term Goals Week 2:  OT Short Term Goal 1 (Week 2): Patient will thread  BLE through LB clothing seated EOB. OT Short Term Goal 1 - Progress (Week 2): Progressing toward goal OT Short Term Goal 2 (Week 2): Patient will don UB clothing with set-up assist utilizing hemi technique OT Short Term Goal 2 - Progress (Week 2): Progressing toward goal OT Short Term Goal 3 (Week 2): Patient will complete oral hygiene with use of RUE as a stabilizer. OT Short Term Goal 3 - Progress (Week 2): Met OT Short Term Goal 4 (Week 2): Patient will complete stand-pivot transfers with Min A and LRAD. OT Short Term Goal 4 - Progress (Week 2): Met Week 3:  OT Short Term Goal 1 (Week 3): STG=LTG 2/2 ELOS   Week 2:  OT Short Term Goal 1 (Week 2): Patient will thread BLE through LB clothing seated EOB. OT Short Term Goal 2 (Week 2): Patient will don UB clothing with set-up assist utilizing hemi technique OT Short Term Goal 3 (Week 2): Patient will complete oral hygiene with use of RUE as a stabilizer. OT Short Term Goal 4 (Week 2): Patient Complete stand-pivot transfers with Min A and LRAD. Skilled Therapeutic Interventions/Progress Updates:  Patient met lying supine in bed in agreement with OT treatment session. 0/10 pain reported at rest and with activity. Patient with desire to complete bathing at shower level. Min A with moderate to maximal multimodal cueing for walker management, attention to R, and hand over hand assist to maintain position of R hand on orthotic. Patient able to doff UB clothing with supervision and LB clothing with Min A. Patient able to utilize RUE in bathing tasks to wash L arm. Patient would benefit from shower mit to maximize independence with UB   bathing tasks. Patient demonstrates fair dynamic sitting balance when bathing LB with no noted LOB. Patient also able to wash buttocks in sitting with lateral leans and multimodal cueing for safety. Seated in shower chair patient able to don UB clothing with supervision A and 1-2 cues for hemi technique. Patient able to thread  LLE through brief/pants but requires assist to thread RLE. Assist to hike LB clothing over hips in standing 2/2 balance deficits and decreased R attention. Oral hygiene completed at sink level with mutimodal cues for use of RUE at gross assist level. Session concluded with patient seated in wc with call bell within reach, belt alarm activated, and all needs met.   Therapy Documentation Precautions:  Precautions Precautions: Fall Required Braces or Orthoses: Other Brace Other Brace: bilateral AFO's in shoes Restrictions Weight Bearing Restrictions: No General:     Therapy/Group: Individual Therapy  Layden Caterino R Howerton-Davis 12/05/2019, 7:17 AM

## 2019-12-05 NOTE — Progress Notes (Signed)
[facility] PHYSICAL MEDICINE & REHABILITATION PROGRESS NOTE   Subjective/Complaints: No issues overnite, remains aphasic, says he has pain on right side but then gestrures that he can't move it well, having word substitutions  ROS: Limited due to aphasia  Objective:   No results found. Recent Labs    12/04/19 0728  WBC 5.5  HGB 13.5  HCT 40.5  PLT 163   Recent Labs    12/04/19 0728  NA 138  K 4.3  CL 105  CO2 25  GLUCOSE 105*  BUN 14  CREATININE 1.07  CALCIUM 9.2    Intake/Output Summary (Last 24 hours) at 12/05/2019 0854 Last data filed at 12/05/2019 0615 Gross per 24 hour  Intake 460 ml  Output 200 ml  Net 260 ml     Physical Exam: Vital Signs Blood pressure (!) 98/59, pulse (!) 57, temperature 97.7 F (36.5 C), temperature source Oral, resp. rate 16, height 5\' 10"  (1.778 m), weight 82 kg, SpO2 99 %.  General: No acute distress Mood and affect are appropriate Heart: Regular rate and rhythm no rubs murmurs or extra sounds Lungs: Clear to auscultation, breathing unlabored, no rales or wheezes Abdomen: Positive bowel sounds, soft nontender to palpation, nondistended Extremities: No clubbing, cyanosis, or edema Skin: No evidence of breakdown, no evidence of rash   Neuro: Alert Expressive > receptive aphasia, unchanged LUE: 5/5 proximal distal LLE: Hip flexion, knee extension 4-4+/5, ankle dorsiflexion 1/5 RUE: Shoulder abduction 1/5, elbow flexion/extension, wrist extension 0/5, handgrip 3/5, overall limited due to apraxia, stable RLE: Hip flexion, knee extension 2+/5, ankle dorsiflexion 0/5, persistent  Assessment/Plan:  1. 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  Physiatrist is providing close team supervision and 24 hour management of active medical problems  listed below.  Physiatrist and rehab team continue to assess barriers to discharge/monitor patient progress toward functional and medical goals  Care Tool:  Bathing  Bathing activity did not occur:  (patient refused) Body parts bathed by patient: Right arm, Chest, Abdomen, Front perineal area, Right upper leg, Left upper leg, Face, Left arm, Right lower leg, Left lower leg   Body parts bathed by helper: Buttocks     Bathing assist Assist Level: Minimal Assistance - Patient > 75%     Upper Body Dressing/Undressing Upper body dressing   What is the patient wearing?: Pull over shirt    Upper body assist Assist Level: Supervision/Verbal cueing    Lower Body Dressing/Undressing Lower body dressing      What is the patient wearing?: Pants, Incontinence brief     Lower body assist Assist for lower body dressing: Moderate Assistance - Patient 50 - 74%     Toileting Toileting    Toileting assist Assist for toileting: Moderate Assistance - Patient 50 - 74%     Transfers Chair/bed transfer  Transfers assist     Chair/bed transfer assist level: Minimal Assistance - Patient > 75%     Locomotion Ambulation   Ambulation assist      Assist level: Minimal Assistance - Patient > 75% Assistive device: Orthosis Max distance: 50   Walk 10 feet activity   Assist     Assist level: Minimal Assistance - Patient > 75% Assistive device: Walker-rolling, Orthosis   Walk 50 feet activity   Assist Walk 50 feet with 2 turns activity  did not occur: Safety/medical concerns  Assist level: Minimal Assistance - Patient > 75% Assistive device: Walker-rolling, Orthosis    Walk 150 feet activity   Assist Walk 150 feet activity did not occur: Safety/medical concerns         Walk 10 feet on uneven surface  activity   Assist Walk 10 feet on uneven surfaces activity did not occur: Safety/medical concerns         Wheelchair     Assist Will patient use  wheelchair at discharge?: Yes Type of Wheelchair: Manual    Wheelchair assist level: Supervision/Verbal cueing Max wheelchair distance: 175    Wheelchair 50 feet with 2 turns activity    Assist        Assist Level: Supervision/Verbal cueing   Wheelchair 150 feet activity     Assist      Assist Level: Supervision/Verbal cueing   Blood pressure (!) 98/59, pulse (!) 57, temperature 97.7 F (36.5 C), temperature source Oral, resp. rate 16, height 5\' 10"  (1.778 m), weight 82 kg, SpO2 99 %.  Medical Problem List and Plan: 1.  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  Antithrombotics: -DVT/anticoagulation: Lovenox             -antiplatelet therapy: Plavix 75 mg daily 3. Pain Management: Tylenol as needed No pain complaints 4. Mood: Provide emotional support             -antipsychotic agents: N/A 5. Neuropsych: This patient is not fully capable of making decisions on his own behalf with accommodations for aphasia  6. Skin/Wound Care: Routine skin checks 7. Fluids/Electrolytes/Nutrition: Routine in and outs.    BMP within acceptable range on 10/17, labs ordered for tomorrow 8.  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  Follow-up outpatient 9.  Hyperlipidemia.    Continue Lipitor 10.  BPH.    Resumed tamsulosin  Controlled 11.  no hx HTN BPs in ml range may be a bit low from tamsulosin   Vitals:   12/04/19 1954 12/05/19 0613  BP: 121/72 (!) 98/59  Pulse: 67 (!) 57  Resp: 17 16  Temp:  97.7 F (36.5 C)  SpO2: 100% 99%   Controlled on 10/25 with asympt brady 12.  Slow transit constipation  Bowel meds  increased on 10/18, increased again on 10/23, increased again on 10/24 13.  Thrombocytopenia,improved , 163K back in normal range  Continue to monitor   LOS: 18 days A FACE TO FACE EVALUATION WAS PERFORMED  Charlett Blake 12/05/2019, 8:54 AM

## 2019-12-05 NOTE — Plan of Care (Signed)
  Problem: RH Balance Goal: LTG Patient will maintain dynamic standing with ADLs (OT) Description: LTG:  Patient will maintain dynamic standing balance with assist during activities of daily living (OT)  Flowsheets (Taken 12/05/2019 1510) LTG: Pt will maintain dynamic standing balance during ADLs with: Minimal Assistance - Patient > 75% Note: Goal downgraded 2/2 slower than anticipated progress.    Problem: Sit to Stand Goal: LTG:  Patient will perform sit to stand in prep for activites of daily living with assistance level (OT) Description: LTG:  Patient will perform sit to stand in prep for activites of daily living with assistance level (OT) Flowsheets (Taken 12/05/2019 1510) LTG: PT will perform sit to stand in prep for activites of daily living with assistance level: Supervision/Verbal cueing Note: Goal downgraded 2/2 slower than anticipated progress.    Problem: RH Grooming Goal: LTG Patient will perform grooming w/assist,cues/equip (OT) Description: LTG: Patient will perform grooming with assist, with/without cues using equipment (OT) Flowsheets (Taken 12/05/2019 1510) LTG: Pt will perform grooming with assistance level of: Supervision/Verbal cueing Note: Goal downgraded 2/2 slower than anticipated progress.    Problem: RH Bathing Goal: LTG Patient will bathe all body parts with assist levels (OT) Description: LTG: Patient will bathe all body parts with assist levels (OT) Flowsheets (Taken 12/05/2019 1510) LTG: Pt will perform bathing with assistance level/cueing: Supervision/Verbal cueing Note: Goal downgraded 2/2 slower than anticipated progress.    Problem: RH Toileting Goal: LTG Patient will perform toileting task (3/3 steps) with assistance level (OT) Description: LTG: Patient will perform toileting task (3/3 steps) with assistance level (OT)  Flowsheets (Taken 12/05/2019 1510) LTG: Pt will perform toileting task (3/3 steps) with assistance level: Minimal Assistance -  Patient > 75%   Problem: RH Tub/Shower Transfers Goal: LTG Patient will perform tub/shower transfers w/assist (OT) Description: LTG: Patient will perform tub/shower transfers with assist, with/without cues using equipment (OT) Flowsheets (Taken 12/05/2019 1510) LTG: Pt will perform tub/shower stall transfers with assistance level of: Minimal Assistance - Patient > 75% Note: Goal downgraded 2/2 slower than anticipated progress.

## 2019-12-06 ENCOUNTER — Ambulatory Visit (HOSPITAL_COMMUNITY): Payer: PPO | Admitting: Physical Therapy

## 2019-12-06 ENCOUNTER — Encounter (HOSPITAL_COMMUNITY): Payer: PPO

## 2019-12-06 ENCOUNTER — Encounter (HOSPITAL_COMMUNITY): Payer: PPO | Admitting: Speech Pathology

## 2019-12-06 ENCOUNTER — Inpatient Hospital Stay (HOSPITAL_COMMUNITY): Payer: PPO

## 2019-12-06 ENCOUNTER — Inpatient Hospital Stay (HOSPITAL_COMMUNITY): Payer: PPO | Admitting: Physical Therapy

## 2019-12-06 NOTE — Progress Notes (Signed)
Occupational Therapy Session Note  Patient Details  Name: [place] Fant MRN: [id number] Date of Birth: 04-Mar-1950  Today's Date: 12/06/2019 OT Individual Time: 1300-1345 OT Individual Time Calculation (min): 45 min    Short Term Goals: Week 3:  OT Short Term Goal 1 (Week 3): STG=LTG 2/2 ELOS  Skilled Therapeutic Interventions/Progress Updates:    Pt resting in w/c upon arrival with girlfriend [name]) present. OT intervention with focus on education in preparation for discharge. Discussed assist level for bathing/dressing. Pt requires CGA for sit<>stand. Recommended use of gait belt. Discussed need for assistance with LB bathing/dressing. [name] practiced sit<>stand X 3. Discussed bathing equipment. [name] to order TTB from [employer]. Transitioned to tub room to practice TTB transfers. Pt does not have grab bars (he had previously stated that he does). Pt erquires mod A/max A for squat pivot transfer to R without use of grab bar. Recommended holding off on shower at home until [facility] and installation of grab bar.  Pt owns BSC but [name] states bathroom is too small to use toilet at this time.  Unable to practice stand pivot transfer to [facility] 2/2 time constraints. Discussed with PT and PT stated he would be able to practice transfer with pt and [name]. Pt remained in w/c and handed off to SLP.   Therapy Documentation Precautions:  Precautions Precautions: Fall Required Braces or Orthoses: Other Brace Other Brace: bilateral AFO's in shoes Restrictions Weight Bearing Restrictions: No Pain:  Pt denies pain this afternoon   Therapy/Group: Individual Therapy  Leroy Libman 12/06/2019, 2:51 PM

## 2019-12-06 NOTE — Patient Care Conference (Signed)
Inpatient RehabilitationTeam Conference and Plan of Care Update Date: 12/06/2019   Time: 10:27 AM   Patient Name: Philip Stone      Medical Record Number: [id number]  Date of Birth: 03/03/1950 Sex: Male         Room/Bed: 4W10C/4W10C-01 Payor Info: Payor: [employer] / Plan: [employer] Must PPO / Product Type: *No Product type* /    Admit Date/Time:  11/17/2019 12:41 PM  Primary Diagnosis:  Left thalamic infarction [facility])  Hospital Problems: Principal Problem:   Left thalamic infarction [facility]) Active Problems:   Slow transit constipation   Right hemiparesis ([place])   Apraxia   Bradycardia    Expected Discharge Date: Expected Discharge Date: 12/08/19  Team Members Present: Physician leading conference: Dr. Alysia Penna Care Coodinator Present: Dorien Chihuahua, RN, BSN, CRRN;Christina Sampson Goon, BSW Nurse Present: Renda Rolls, LPN PT Present: Barrie Folk, PT OT Present: Laverle Hobby, OT SLP Present: Jettie Booze, CF-SLP PPS Coordinator present : Ileana Ladd, PT     Current Status/Progress Goal Weekly Team Focus  Bowel/Bladder   pt is continent of b/b  pt will continue to be continent of b/b      Swallow/Nutrition/ Hydration             ADL's   Supervision A grooming, Min A bathing, Supervision A UB dressing, Mod A LB dressing. Slow progress toward goals.  Supervision to Min A overall  Family ed., ADL transfers, RUE NMR, dynamic standing balance.   Mobility   min assist bed mobility, min assist sit<>stand using RW, min/mod assist stand pivot transfers using RW, min assist gait up to 150ft using RW, continues to demonstrate significant sensory impairments with R field cut  min assist overall  R LE NMR, transfer training, gait training, activity tolerance, pt education, dynamic standing balance   Communication   Mod A word finding, communicating about familiar topics and wants and needs Supervision-Min  Min-Mod  word finding, reading, writing, education    Safety/Cognition/ Behavioral Observations  Min A  Min A  basic familiar problem solving, emergent awareness, recall, education   Pain   pt reports no issues with pain  pt will continue to report no pain      Skin   pt skin is intact  pt will experience no new skin breakdown        Discharge Planning:  Discharging Friday. Church installing ramp, [place] set, DME ordered   Team Discussion: No major changes, medically stable for discharge. Some sensory deficits continue; requires min cues for activities. Continent of B+B. Mod assist for word finding/communication.  Patient on target to meet rehab goals: yes, on target to meet goals  *See Care Plan and progress notes for long and short-term goals.   Revisions to Treatment Plan:  OT downgraded goals to a supervision level  Teaching Needs: Transfers, cues for activities, toileting, medications, etc.   Current Barriers to Discharge: Decreased caregiver support and Charcot Lelan Pons Tooth Disease premorbid, right side weakness  Possible Resolutions to Barriers: Family education 12/06/19     Medical Summary Current Status: RIght neglect improving, reduced sensation on RIght , apraxia adn aphasia  Barriers to Discharge: Medical stability   Possible Resolutions to Barriers/Weekly Focus: D/c planning, family training, needs ramp prior to d/c   Continued Need for Acute Rehabilitation Level of Care: The patient requires daily medical management by a physician with specialized training in physical medicine and rehabilitation for the following reasons: Direction of a multidisciplinary physical rehabilitation program to maximize functional  independence : Yes Medical management of patient stability for increased activity during participation in an intensive rehabilitation regime.: Yes Analysis of laboratory values and/or radiology reports with any subsequent need for medication adjustment and/or medical intervention. : Yes   I attest that I was  present, lead the team conference, and concur with the assessment and plan of the team.   Dorien Chihuahua B 12/06/2019, 3:11 PM

## 2019-12-06 NOTE — Progress Notes (Signed)
Patient ID: Philip Stone, male   DOB: Oct 24, 1950, 69 y.o.   MRN: [id number]  Team Conference Report to Patient/Family  Team Conference discussion was reviewed with the patient and caregiver, including goals, any changes in plan of care and target discharge date.  Patient and caregiver express understanding and are in agreement.  The patient has a target discharge date of 12/08/19.  Dyanne Iha 12/06/2019, 1:01 PM

## 2019-12-06 NOTE — Discharge Summary (Signed)
Physician Discharge Summary  Patient ID: Clayson Riling MRN: [id number] DOB/AGE: 08-16-1950 69 y.o.  Admit date: 11/17/2019 Discharge date: 12/08/2019  Discharge Diagnoses:  Principal Problem:   Left thalamic infarction [facility]) Active Problems:   Slow transit constipation   Right hemiparesis (HCC)   Apraxia   Bradycardia DVT prophylaxis Hyperlipidemia BPH Charcot-Marie-Tooth disease Remote tobacco abuse  Discharged Condition: Constipation  Significant Diagnostic Studies: MR ANGIO HEAD WO CONTRAST  Result Date: 11/15/2019 CLINICAL DATA:  Initial evaluation for acute neuro deficit, stroke suspected. EXAM: MRI HEAD WITHOUT CONTRAST MRA HEAD WITHOUT CONTRAST MRA NECK WITHOUT AND WITH CONTRAST TECHNIQUE: Multiplanar, multiecho pulse sequences of the brain and surrounding structures were obtained without intravenous contrast. Angiographic images of the Circle of Willis were obtained using MRA technique without intravenous contrast. Angiographic images of the neck were obtained using MRA technique without and with intravenous contrast. Carotid stenosis measurements (when applicable) are obtained utilizing NASCET criteria, using the distal internal carotid diameter as the denominator. CONTRAST:  74mL GADAVIST GADOBUTROL 1 MMOL/ML IV SOLN COMPARISON:  Recent MRI/MRA from 11/14/2019. FINDINGS: MRI HEAD FINDINGS Brain: There has been interval development of patchy restricted diffusion involving the parasagittal left temporal occipital region, compatible with an acute left PCA territory infarct, overall moderate in size. Involvement is greatest at the mesial left temporal lobe/left hippocampal formation. Mild patchy involvement of the left thalamus noted as well. Additional punctate cortical infarct noted superiorly at the paramedian left parietal lobe (series 5, image thirty-five). No associated hemorrhage or mass effect. No other evidence for acute or interval infarction. Gray-white matter differentiation  otherwise maintained. No susceptibility artifact to suggest acute or chronic intracranial hemorrhage. Underlying atrophy with moderate chronic microvascular ischemic disease again noted. No mass lesion, mass effect, or midline shift. No hydrocephalus or extra-axial fluid collection. Pituitary gland and suprasellar region normal. Midline structures intact. Vascular: Major intracranial vascular flow voids are maintained. Skull and upper cervical spine: Craniocervical junction within normal limits. Postsurgical changes partially visualize within the upper cervical spine. Bone marrow signal intensity normal. No scalp soft tissue abnormality. Sinuses/Orbits: Subtle flattening with T2 hypointense thickening noted at the posterolateral aspects of the globes bilaterally, right slightly greater than left (series 10, image 8), of uncertain significance. Globes and orbital soft tissues otherwise unremarkable. Scattered mucosal thickening noted within the frontoethmoidal sinuses. Paranasal sinuses are otherwise clear. No significant mastoid effusion. Inner ear structures grossly normal. Other: None. MRA HEAD FINDINGS ANTERIOR CIRCULATION: Distal cervical segments of the internal carotid arteries are widely patent with antegrade flow. Mild atheromatous irregularity throughout the carotid siphons without hemodynamically significant stenosis or other abnormality. A1 segments, anterior communicating artery complex, and anterior cerebral arteries widely patent and within normal limits. No M1 stenosis or occlusion. Distal MCA branches well perfused and symmetric. POSTERIOR CIRCULATION: Vertebral arteries widely patent to the vertebrobasilar junction. Left vertebral artery strongly dominant, with a diffusely hypoplastic right vertebral artery. Both picas patent. Basilar widely patent to its distal aspect. Superior cerebral arteries patent bilaterally. Right PCA supplied via the basilar remains widely patent. Interval occlusion of the  left PCA since previous exam. Some attenuated flow is seen within the left posterior communicating artery. No intracranial aneurysm. MRA NECK FINDINGS AORTIC ARCH: Visualized aortic arch of normal caliber. Bovine branching pattern with common origin of the innominate and left common carotid arteries noted. No hemodynamically significant stenosis seen about the origin of the great vessels. RIGHT CAROTID SYSTEM: Right CCA widely patent from its origin to the bifurcation without stenosis. No significant atheromatous narrowing  about the right bifurcation. Right ICA widely patent to the skull base without stenosis or other abnormality. LEFT CAROTID SYSTEM: Left CCA widely patent from its origin to the bifurcation without stenosis. No more than mild short-segment 20% stenosis at the origin of the left ICA. Left ICA otherwise widely patent distally without stenosis or other vascular abnormality. VERTEBRAL ARTERIES: Both vertebral arteries arise from subclavian arteries. No proximal subclavian artery stenosis. Left vertebral artery strongly dominant, with a diffusely hypoplastic right vertebral artery. Both vertebral arteries widely patent within the neck without stenosis or occlusion. IMPRESSION: MRI HEAD IMPRESSION: 1. 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 No associated mass effect. 2. Underlying atrophy with moderate chronic microvascular ischemic disease. 3. Subtle flattening with T2 hypointense thickening at the posterolateral aspects of the globes bilaterally, of uncertain significance. Correlation with history and funduscopic exam suggested. MRA HEAD IMPRESSION: 1. Interval occlusion of the left PCA since previous exam. 2. Otherwise stable intracranial MRA. No other large vessel occlusion. No other hemodynamically significant or correctable stenosis. MRA NECK IMPRESSION: 1.  No more than mild short-segment stenosis at the origin of the left ICA. Otherwise wide patency of both carotid artery systems within the neck. 2. Both vertebral arteries widely patent within the neck. Left vertebral artery strongly dominant, with a diffusely hypoplastic right vertebral artery. Electronically Signed   By: Jeannine Boga M.D.   On: 11/15/2019 20:13   MR ANGIO HEAD WO CONTRAST  Result Date: 11/14/2019 CLINICAL DATA:  Right facial numbness, blurry vision EXAM: MRI HEAD WITHOUT CONTRAST MRA HEAD WITHOUT CONTRAST TECHNIQUE: Multiplanar, multiecho pulse sequences of the brain and surrounding structures were obtained without intravenous contrast. Angiographic images of the head were obtained using MRA technique without contrast. COMPARISON:  None. FINDINGS: MRI HEAD Brain: There is no acute infarction or intracranial hemorrhage. There is no intracranial mass, mass effect, or edema. There is no hydrocephalus or extra-axial fluid collection. Ventricles and sulci are within normal limits in size and configuration. Patchy foci of T2 hyperintensity in the supratentorial white matter are nonspecific but may reflect mild to moderate chronic microvascular ischemic changes in a patient of this age. 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 Vascular: Major vessel flow voids at the skull base are preserved. Skull and upper cervical spine: Normal marrow signal is preserved. Sinuses/Orbits: Paranasal sinuses are aerated. Orbits are unremarkable. Other: Sella is unremarkable.  Mastoid air cells are clear. MRA HEAD Degraded by artifact. Intracranial internal carotid arteries are patent. Middle and anterior cerebral arteries are patent. Visual intracranial vertebral arteries are patent. The basilar artery is patent. The posterior cerebral arteries are patent. There is fetal origin of the left PCA. 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 There is no aneurysm.  IMPRESSION: No acute infarction, hemorrhage, or mass. Moderate chronic microvascular ischemic changes. 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 Electronically Signed   By: Macy Mis M.D.   On: 11/14/2019 13:38   MR ANGIO NECK W WO CONTRAST  Result Date: 11/15/2019 CLINICAL DATA:  Initial evaluation for acute neuro deficit, stroke suspected. EXAM: MRI HEAD WITHOUT CONTRAST MRA HEAD WITHOUT CONTRAST MRA NECK WITHOUT AND WITH CONTRAST TECHNIQUE: Multiplanar, multiecho pulse sequences of the brain and surrounding structures were obtained without intravenous contrast. Angiographic images of the Circle of Willis were obtained using MRA technique without intravenous contrast. Angiographic images  of the neck were obtained using MRA technique without and with intravenous contrast. Carotid stenosis measurements (when applicable) are obtained utilizing NASCET criteria, using the distal internal carotid diameter as the denominator. CONTRAST:  23mL GADAVIST GADOBUTROL 1 MMOL/ML IV SOLN COMPARISON:  Recent MRI/MRA from 11/14/2019. FINDINGS: MRI HEAD FINDINGS Brain: There has been interval development of patchy restricted diffusion involving the parasagittal left temporal occipital region, compatible with an acute left PCA territory infarct, overall moderate in size. Involvement is greatest at the mesial left temporal lobe/left hippocampal formation. Mild patchy involvement of the left thalamus noted as well. Additional punctate cortical infarct noted superiorly at the paramedian left parietal lobe (series 5, image thirty-five). No associated hemorrhage or mass effect. No other evidence for acute or interval infarction. Gray-white matter differentiation otherwise maintained. No susceptibility artifact to suggest acute or chronic intracranial hemorrhage. Underlying atrophy with moderate chronic microvascular ischemic disease again noted. No mass lesion, mass effect, or midline shift. No hydrocephalus  or extra-axial fluid collection. Pituitary gland and suprasellar region normal. Midline structures intact. Vascular: Major intracranial vascular flow voids are maintained. Skull and upper cervical spine: Craniocervical junction within normal limits. Postsurgical changes partially visualize within the upper cervical spine. Bone marrow signal intensity normal. No scalp soft tissue abnormality. Sinuses/Orbits: Subtle flattening with T2 hypointense thickening noted at the posterolateral aspects of the globes bilaterally, right slightly greater than left (series 10, image 8), of uncertain significance. Globes and orbital soft tissues otherwise unremarkable. Scattered mucosal thickening noted within the frontoethmoidal sinuses. Paranasal sinuses are otherwise clear. No significant mastoid effusion. Inner ear structures grossly normal. Other: None. MRA HEAD FINDINGS ANTERIOR CIRCULATION: Distal cervical segments of the internal carotid arteries are widely patent with antegrade flow. Mild atheromatous irregularity throughout the carotid siphons without hemodynamically significant stenosis or other abnormality. A1 segments, anterior communicating artery complex, and anterior cerebral arteries widely patent and within normal limits. No M1 stenosis or occlusion. Distal MCA branches well perfused and symmetric. POSTERIOR CIRCULATION: Vertebral arteries widely patent to the vertebrobasilar junction. Left vertebral artery strongly dominant, with a diffusely hypoplastic right vertebral artery. Both picas patent. Basilar widely patent to its distal aspect. Superior cerebral arteries patent bilaterally. Right PCA supplied via the basilar remains widely patent. Interval occlusion of the left PCA since previous exam. Some attenuated flow is seen within the left posterior communicating artery. No intracranial aneurysm. MRA NECK FINDINGS AORTIC ARCH: Visualized aortic arch of normal caliber. Bovine branching pattern with common origin  of the innominate and left common carotid arteries noted. No hemodynamically significant stenosis seen about the origin of the great vessels. RIGHT CAROTID SYSTEM: Right CCA widely patent from its origin to the bifurcation without stenosis. No significant atheromatous narrowing about the right bifurcation. Right ICA widely patent to the skull base without stenosis or other abnormality. LEFT CAROTID SYSTEM: Left CCA widely patent from its origin to the bifurcation without stenosis. No more than mild short-segment 20% stenosis at the origin of the left ICA. Left ICA otherwise widely patent distally without stenosis or other vascular abnormality. VERTEBRAL ARTERIES: Both vertebral arteries arise from subclavian arteries. No proximal subclavian artery stenosis. Left vertebral artery strongly dominant, with a diffusely hypoplastic right vertebral artery. Both vertebral arteries widely patent within the neck without stenosis or occlusion. IMPRESSION: MRI HEAD IMPRESSION: 1. 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 No associated mass effect. 2. Underlying atrophy with moderate chronic microvascular  ischemic disease. 3. Subtle flattening with T2 hypointense thickening at the posterolateral aspects of the globes bilaterally, of uncertain significance. Correlation with history and funduscopic exam suggested. MRA HEAD IMPRESSION: 1. Interval occlusion of the left PCA since previous exam. 2. Otherwise stable intracranial MRA. No other large vessel occlusion. No other hemodynamically significant or correctable stenosis. MRA NECK IMPRESSION: 1. No more than mild short-segment stenosis at the origin of the left ICA. Otherwise wide patency of both carotid artery systems within the neck. 2. Both vertebral arteries widely patent within the neck. Left vertebral artery strongly dominant, with a  diffusely hypoplastic right vertebral artery. Electronically Signed   By: Jeannine Boga M.D.   On: 11/15/2019 20:13   MR BRAIN WO CONTRAST  Result Date: 11/15/2019 CLINICAL DATA:  Initial evaluation for acute neuro deficit, stroke suspected. EXAM: MRI HEAD WITHOUT CONTRAST MRA HEAD WITHOUT CONTRAST MRA NECK WITHOUT AND WITH CONTRAST TECHNIQUE: Multiplanar, multiecho pulse sequences of the brain and surrounding structures were obtained without intravenous contrast. Angiographic images of the Circle of Willis were obtained using MRA technique without intravenous contrast. Angiographic images of the neck were obtained using MRA technique without and with intravenous contrast. Carotid stenosis measurements (when applicable) are obtained utilizing NASCET criteria, using the distal internal carotid diameter as the denominator. CONTRAST:  40mL GADAVIST GADOBUTROL 1 MMOL/ML IV SOLN COMPARISON:  Recent MRI/MRA from 11/14/2019. FINDINGS: MRI HEAD FINDINGS Brain: There has been interval development of patchy restricted diffusion involving the parasagittal left temporal occipital region, compatible with an acute left PCA territory infarct, overall moderate in size. Involvement is greatest at the mesial left temporal lobe/left hippocampal formation. Mild patchy involvement of the left thalamus noted as well. Additional punctate cortical infarct noted superiorly at the paramedian left parietal lobe (series 5, image thirty-five). No associated hemorrhage or mass effect. No other evidence for acute or interval infarction. Gray-white matter differentiation otherwise maintained. No susceptibility artifact to suggest acute or chronic intracranial hemorrhage. Underlying atrophy with moderate chronic microvascular ischemic disease again noted. No mass lesion, mass effect, or midline shift. No hydrocephalus or extra-axial fluid collection. Pituitary gland and suprasellar region normal. Midline structures intact. Vascular: Major  intracranial vascular flow voids are maintained. Skull and upper cervical spine: Craniocervical junction within normal limits. Postsurgical changes partially visualize within the upper cervical spine. Bone marrow signal intensity normal. No scalp soft tissue abnormality. Sinuses/Orbits: Subtle flattening with T2 hypointense thickening noted at the posterolateral aspects of the globes bilaterally, right slightly greater than left (series 10, image 8), of uncertain significance. Globes and orbital soft tissues otherwise unremarkable. Scattered mucosal thickening noted within the frontoethmoidal sinuses. Paranasal sinuses are otherwise clear. No significant mastoid effusion. Inner ear structures grossly normal. Other: None. MRA HEAD FINDINGS ANTERIOR CIRCULATION: Distal cervical segments of the internal carotid arteries are widely patent with antegrade flow. Mild atheromatous irregularity throughout the carotid siphons without hemodynamically significant stenosis or other abnormality. A1 segments, anterior communicating artery complex, and anterior cerebral arteries widely patent and within normal limits. No M1 stenosis or occlusion. Distal MCA branches well perfused and symmetric. POSTERIOR CIRCULATION: Vertebral arteries widely patent to the vertebrobasilar junction. Left vertebral artery strongly dominant, with a diffusely hypoplastic right vertebral artery. Both picas patent. Basilar widely patent to its distal aspect. Superior cerebral arteries patent bilaterally. Right PCA supplied via the basilar remains widely patent. Interval occlusion of the left PCA since previous exam. Some attenuated flow is seen within the left posterior communicating artery. No intracranial aneurysm. MRA NECK FINDINGS AORTIC  ARCH: Visualized aortic arch of normal caliber. Bovine branching pattern with common origin of the innominate and left common carotid arteries noted. No hemodynamically significant stenosis seen about the origin of  the great vessels. RIGHT CAROTID SYSTEM: Right CCA widely patent from its origin to the bifurcation without stenosis. No significant atheromatous narrowing about the right bifurcation. Right ICA widely patent to the skull base without stenosis or other abnormality. LEFT CAROTID SYSTEM: Left CCA widely patent from its origin to the bifurcation without stenosis. No more than mild short-segment 20% stenosis at the origin of the left ICA. Left ICA otherwise widely patent distally without stenosis or other vascular abnormality. VERTEBRAL ARTERIES: Both vertebral arteries arise from subclavian arteries. No proximal subclavian artery stenosis. Left vertebral artery strongly dominant, with a diffusely hypoplastic right vertebral artery. Both vertebral arteries widely patent within the neck without stenosis or occlusion. IMPRESSION: MRI HEAD IMPRESSION: 1. 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 No associated mass effect. 2. Underlying atrophy with moderate chronic microvascular ischemic disease. 3. Subtle flattening with T2 hypointense thickening at the posterolateral aspects of the globes bilaterally, of uncertain significance. Correlation with history and funduscopic exam suggested. MRA HEAD IMPRESSION: 1. Interval occlusion of the left PCA since previous exam. 2. Otherwise stable intracranial MRA. No other large vessel occlusion. No other hemodynamically significant or correctable stenosis. MRA NECK IMPRESSION: 1. No more than mild short-segment stenosis at the origin of the left ICA. Otherwise wide patency of both carotid artery systems within the neck. 2. Both vertebral arteries widely patent within the neck. Left vertebral artery strongly dominant, with a diffusely hypoplastic right vertebral artery. Electronically Signed   By: Jeannine Boga M.D.   On: 11/15/2019 20:13    MR BRAIN WO CONTRAST  Result Date: 11/14/2019 CLINICAL DATA:  Right facial numbness, blurry vision EXAM: MRI HEAD WITHOUT CONTRAST MRA HEAD WITHOUT CONTRAST TECHNIQUE: Multiplanar, multiecho pulse sequences of the brain and surrounding structures were obtained without intravenous contrast. Angiographic images of the head were obtained using MRA technique without contrast. COMPARISON:  None. FINDINGS: MRI HEAD Brain: There is no acute infarction or intracranial hemorrhage. There is no intracranial mass, mass effect, or edema. There is no hydrocephalus or extra-axial fluid collection. Ventricles and sulci are within normal limits in size and configuration. Patchy foci of T2 hyperintensity in the supratentorial white matter are nonspecific but may reflect mild to moderate chronic microvascular ischemic changes in a patient of this age. 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 Vascular: Major vessel flow voids at the skull base are preserved. Skull and upper cervical spine: Normal marrow signal is preserved. Sinuses/Orbits: Paranasal sinuses are aerated. Orbits are unremarkable. Other: Sella is unremarkable.  Mastoid air cells are clear. MRA HEAD Degraded by artifact. Intracranial internal carotid arteries are patent. Middle and anterior cerebral arteries are patent. Visual intracranial vertebral arteries are patent. The basilar artery is patent. The posterior cerebral arteries are patent. There is fetal origin of the left PCA. 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 There is no aneurysm. IMPRESSION: No acute infarction, hemorrhage, or mass. Moderate chronic microvascular ischemic changes. 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 Electronically Signed   By: Macy Mis M.D.   On: 11/14/2019 13:38   ECHOCARDIOGRAM COMPLETE  Result Date: 11/16/2019    ECHOCARDIOGRAM REPORT   Patient Name:  Ranald Selders Date of Exam: 11/16/2019  Medical Rec #:  [id number]    Height:       70.0 in Accession #:    [id number]   Weight:       180.0 lb Date of Birth:  [birth year]    BSA:          1.996 m Patient Age:    73 years     BP:           155/82 mmHg Patient Gender: M            HR:           67 bpm. Exam Location:  [facility] Procedure: 2D Echo Indications:    Stroke 434.91 / I163.9  History:        Patient has no prior history of Echocardiogram examinations.                 Stroke; Risk Factors:Former Smoker. CMT (Charcot-Marie-Tooth                 disease, GERD.  Sonographer:    Leavy Cella RDCS (AE) Referring Phys: [id number] OLADAPO ADEFESO IMPRESSIONS  1. Left ventricular ejection fraction, by estimation, is 60 to 65%. The left ventricle has normal function. The left ventricle has no regional wall motion abnormalities. There is mild left ventricular hypertrophy. Left ventricular diastolic parameters are consistent with Grade I diastolic dysfunction (impaired relaxation).  2. Right ventricular systolic function is normal. The right ventricular size is normal.  3. The mitral valve is normal in structure. No evidence of mitral valve regurgitation. No evidence of mitral stenosis.  4. The aortic valve is tricuspid. Aortic valve regurgitation is not visualized. No aortic stenosis is present.  5. The inferior vena cava is normal in size with greater than 50% respiratory variability, suggesting right atrial pressure of 3 mmHg. FINDINGS  Left Ventricle: Left ventricular ejection fraction, by estimation, is 60 to 65%. The left ventricle has normal function. The left ventricle has no regional wall motion abnormalities. The left ventricular internal cavity size was normal in size. There is  mild left ventricular hypertrophy. Left ventricular diastolic parameters are consistent with Grade I diastolic dysfunction (impaired relaxation). Normal left ventricular filling pressure. Right Ventricle: The right ventricular size is normal. No increase in right  ventricular wall thickness. Right ventricular systolic function is normal. Left Atrium: Left atrial size was normal in size. Right Atrium: Right atrial size was normal in size. Pericardium: There is no evidence of pericardial effusion. Mitral Valve: The mitral valve is normal in structure. No evidence of mitral valve regurgitation. No evidence of mitral valve stenosis. Tricuspid Valve: The tricuspid valve is normal in structure. Tricuspid valve regurgitation is trivial. No evidence of tricuspid stenosis. Aortic Valve: The aortic valve is tricuspid. Aortic valve regurgitation is not visualized. No aortic stenosis is present. Aortic valve mean gradient measures 3.2 mmHg. Aortic valve peak gradient measures 5.4 mmHg. Aortic valve area, by VTI measures 3.93 cm. Pulmonic Valve: The pulmonic valve was not well visualized. Pulmonic valve regurgitation is not visualized. No evidence of pulmonic stenosis. Aorta: The aortic root is normal in size and structure. Venous: The inferior vena cava is normal in size with greater than 50% respiratory variability, suggesting right atrial pressure of 3 mmHg. IAS/Shunts: The interatrial septum was not well visualized.  LEFT VENTRICLE PLAX 2D LVIDd:         4.39 cm  Diastology LVIDs:  2.15 cm  LV e' medial:    5.77 cm/s LV PW:         1.13 cm  LV E/e' medial:  11.2 LV IVS:        1.25 cm  LV e' lateral:   8.49 cm/s LVOT diam:     2.00 cm  LV E/e' lateral: 7.6 LV SV:         91 LV SV Index:   45 LVOT Area:     3.14 cm  RIGHT VENTRICLE RV S prime:     16.90 cm/s TAPSE (M-mode): 1.9 cm LEFT ATRIUM             Index       RIGHT ATRIUM           Index LA diam:        3.70 cm 1.85 cm/m  RA Area:     10.20 cm LA Vol (A2C):   41.7 ml 20.89 ml/m RA Volume:   23.60 ml  11.82 ml/m LA Vol (A4C):   36.6 ml 18.34 ml/m LA Biplane Vol: 39.6 ml 19.84 ml/m  AORTIC VALVE AV Area (Vmax):    3.53 cm AV Area (Vmean):   3.33 cm AV Area (VTI):     3.93 cm AV Vmax:           116.35 cm/s AV  Vmean:          84.084 cm/s AV VTI:            0.231 m AV Peak Grad:      5.4 mmHg AV Mean Grad:      3.2 mmHg LVOT Vmax:         130.73 cm/s LVOT Vmean:        89.136 cm/s LVOT VTI:          0.289 m LVOT/AV VTI ratio: 1.25  AORTA Ao Root diam: 3.10 cm MITRAL VALVE MV Area (PHT): 2.46 cm     SHUNTS MV Decel Time: 309 msec     Systemic VTI:  0.29 m MV E velocity: 64.50 cm/s   Systemic Diam: 2.00 cm MV A velocity: 133.00 cm/s MV E/A ratio:  0.48 Carlyle Dolly MD Electronically signed by Carlyle Dolly MD Signature Date/Time: 11/16/2019/10:52:08 AM    Final     Labs:  Basic Metabolic Panel: Recent Labs  Lab 12/04/19 0728 12/08/19 0408  NA 138  --   K 4.3  --   CL 105  --   CO2 25  --   GLUCOSE 105*  --   BUN 14  --   CREATININE 1.07 1.08  CALCIUM 9.2  --     CBC: Recent Labs  Lab 12/04/19 0728  WBC 5.5  NEUTROABS 3.3  HGB 13.5  HCT 40.5  MCV 86.9  PLT 163    CBG: No results for input(s): GLUCAP in the last 168 hours.  Family history.  Positive for hypertension and hyperlipidemia.  Denies any colon cancer esophageal cancer rectal cancer  Brief HPI:   Philip Stone is a 69 y.o. 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  Patient lives alone 1 level home 2 steps to entry independent with assistive device and driving.  He does have a girlfriend with good support.  Presented 11/15/2019 to [facility] with right side weakness and numbness x3 days as well as aphasia.  Initial MRI and MRA showed no acute infarct hemorrhage or mass.  Moderate chronic microvascular ischemic changes.  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  No associated mass-effect.  MRA of the head interval occlusion of left PCA since previous exam.  No other large vessel occlusion.  MRA of the neck no more than mild short segment stenosis of the origin of the left ICA.  Both vertebral arteries widely patent.  Patient did not receive TPA.  Echocardiogram with ejection fraction of 60 to 65% no wall motion abnormalities.  Grade 1 diastolic dysfunction.  Admission chemistries unremarkable.  Neurology follow-up Dr. Merlene Laughter maintained on Plavix for CVA prophylaxis.  Subcutaneous Lovenox for DVT prophylaxis.  Tolerating a regular diet.  Noted on 11/17/2019 patient found sitting on the floor in front of his bed with soiled linens no apparent injury except for abrasion left knee.  Therapy evaluations completed and patient was admitted for a comprehensive rehab program   Hospital Course: Labarron Durnin was admitted to rehab 11/17/2019 for inpatient therapies to consist of PT, ST and OT at least three hours five days a week. Past admission physiatrist, therapy team and rehab RN have worked together to provide customized collaborative inpatient rehab.  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  He was on Lovenox for DVT prophylaxis no bleeding episodes.  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  Lipitor ongoing for hyperlipidemia.  He did have a history of BPH maintained on Flomax no dysuria or hematuria noted.  His blood pressure remains well controlled and would need follow-up with his primary MD.   Blood pressures were monitored on TID basis and soft and monitored     Rehab course: During patient's stay in  rehab weekly team conferences were held to monitor patient's progress, set goals and discuss barriers to discharge. At admission, patient required min mod assist stand pivot transfers moderate assist 30 feet with rolling walker.  Mod assist ADLs  Physical exam.  Blood pressure 167/93 pulse 85 temperature 98 respirations 20 oxygen saturation 96% room air Constitutional.  No acute distress HEENT Head.  Normocephalic and atraumatic Eyes.  Pupils round and reactive to light no discharge without nystagmus Neck.  Supple nontender no JVD without thyromegaly Cardiac regular rate rhythm without extra sounds or murmur heard Abdomen.  Soft nontender positive bowel sounds without rebound Neurologic alert oriented moderate expressive aphasia Left upper extremity 5/5 proximal distal Left lower extremity hip flexion knee extension 4 - 4+/5 ankle dorsiflexion 1/5 Right upper extremity appears to be 4/5 proximal distal limited due to apraxia Right lower extremity hip flexion knee extension 2+/5 ankle dorsiflexion 0/5   He/  has had improvement in activity tolerance, balance, postural control as well as ability to compensate for deficits. He/ has had improvement in functional use RUE/LUE  and RLE/LLE as well as improvement in awareness.  Sit to stand contact-guard minimal assist using rolling walker ambulates 100 feet x 2 rolling walker minimal assist needs some cues to clear her right foot.  Required mod max assist to don upper body  clothing max assist to don lower body clothing total assist to don footwear.  He did exhibit some right inattention.  Speech therapy follow-up for aphasia patient was able to match words to objects 9 out of 12 accuracy which was significantly improved throughout his stay.  Despite being unable to read aloud his ability to match most words to objects support improvement in receptive language and reading abilities.  Full family teaching was completed and plan discharge to home        Disposition: Discharge disposition: 01-Home or Self Care     Discharge to home   Diet: Regular  Special Instructions: No driving smoking or alcohol  Medications at discharge 1.  Tylenol as needed 2.  Lipitor 40 mg p.o. daily 3.  Plavix 75 mg p.o. daily 4.  Protonix 40 mg p.o. daily 5.  MiraLAX twice daily hold for loose stools 6.  Flomax 0.8 mg p.o. daily  30-35 minutes were spent completing discharge summary and discharge planning  Discharge Instructions    Ambulatory referral to Neurology   Complete by: As directed    An appointment is requested in approximately 4 weeks left medial temporal region/left thalamic/left occipital infarct   Ambulatory referral to Physical Medicine Rehab   Complete by: As directed    Moderate complexity follow up 2 weeks left medial temporal thalamic occipital infarct       Follow-up Information    Kirsteins, Luanna Salk, MD Follow up.   Specialty: Physical Medicine and Rehabilitation Why: Office to call for appointment Contact information: [place] [address] [phone]               Signed: Cathlyn Parsons 12/08/2019, 5:18 AM

## 2019-12-06 NOTE — Progress Notes (Signed)
Occupational Therapy Session Note  Patient Details  Name: Kingson Lohmeyer MRN: [id number] Date of Birth: 04-15-1950  Today's Date: 12/06/2019 OT Individual Time: 1000-1100 OT Individual Time Calculation (min): 60 min    Short Term Goals: Week 3:  OT Short Term Goal 1 (Week 3): STG=LTG 2/2 ELOS  Skilled Therapeutic Interventions/Progress Updates:    Pt resting in w/c upon arrival. Pt stated he did not need to bathe or change clothing.  Initial focus on TTB transfers using grab bar to assist with squat over to TTB. Pt states he has a grab bar in place at home. Pt performed transfer X 3 with CGA. Transitioned to Day Room and transferred to mat with CGA for squat pivot to L. Pt engaged in RUE functional tasks reaching for objects placed on R and grasp/release. Pt required mod verbal cues initially for sequencing but with repetition was able to sequence correctly without verbal cues.  Pt required assistance at elbow to reduce gravity. Pt returned to room and remained in w/c. Belt alarm activated and all needs within reach.   Therapy Documentation Precautions:  Precautions Precautions: Fall Required Braces or Orthoses: Other Brace Other Brace: bilateral AFO's in shoes Restrictions Weight Bearing Restrictions: No  Pain: Pain Assessment Pain Scale: 0-10 Pain Score: 0-No pain   Therapy/Group: Individual Therapy  Leroy Libman 12/06/2019, 12:27 PM

## 2019-12-06 NOTE — Progress Notes (Signed)
Speech Language Pathology Discharge Summary  Patient Details  Name: Philip Stone MRN: [id number] Date of Birth: 1951/01/02  Today's Date: 12/07/2019 SLP Individual Time: 1000-1055 SLP Individual Time Calculation (min): 55 min   Skilled Therapeutic Interventions:  Pt was seen for skilled ST targeting expressive and receptive language skills. SLP facilitated session with administration of standardized eval - the Bedside Western Aphasia Battery Revised. Pt's scores were as follows: Content (5/10) Fluency (8/10) Verbal comprehension (8/10) Sequential commands (6/10) Repetition (9/10) Object naming (6/10) Reading (4/10) Writing (2/10)    During assessment pt only required Min A semantic and/or phonemic cues to name objects he initially missed. He also demonstrated excellent recall and comprehension of paragraph information when it was read to him. His overall Bedside Aphasia Score = 78.33 and overall Bedside Language Score = 62.50. We discussed his prognosis and follow up ST recommendations.  Pt left laying in bed with alarm set and needs within reach. Continue per current plan of care.    Patient has met 5 of 5 long term goals.  Patient to discharge at overall Min;Mod level.  Reasons goals not met: n/a   Clinical Impression/Discharge Summary:   Pt made functional gains and met 5 out of 5 long term goals this admission. Pt is currently Mod assist for word finding in structured language tasks, Mod-Max for reading and writing at word level, and only Min A for word finding in familiar conversations and to communicate his basic wants and needs due to a mixed expressive receptive aphasia. Although aphasia is mixed, receptive deficits are much improved and only mild in nature now; he is able to follow directions and comprehend basic to mildly complex language with Supervision-Min A verbal and visual cues. He is also becoming more aware of his verbal errors, and communicating that even when he has  difficulty producing accurate corrections. Overall Min A verbal and visual cues are provided for basic problem solving and intellectual awareness. His progress this admission has been slow but stready. However, given continued cognitive and expressive/receptive language deficits still present, recommend pt continue to receive skilled ST services upon discharge. Pt and family education is complete at this time.    Care Partner:  Caregiver Able to Provide Assistance: Yes  Type of Caregiver Assistance: Cognitive  Recommendation:  Outpatient SLP;24 hour supervision/assistance;Home Health SLP  Rationale for SLP Follow Up: Maximize functional communication;Maximize cognitive function and independence;Reduce caregiver burden   Equipment: none   Reasons for discharge: Discharged from hospital   Patient/Family Agrees with Progress Made and Goals Achieved: Yes    Arbutus Leas 12/07/2019, 10:55 AM

## 2019-12-06 NOTE — Plan of Care (Signed)
  Problem: Consults Goal: RH STROKE PATIENT EDUCATION Description: See Patient Education module for education specifics  Outcome: Progressing   Problem: RH BOWEL ELIMINATION Goal: RH STG MANAGE BOWEL WITH ASSISTANCE Description: STG Manage Bowel with min Assistance. Outcome: Progressing   Problem: RH BLADDER ELIMINATION Goal: RH STG MANAGE BLADDER WITH ASSISTANCE Description: STG Manage Bladder With min Assistance Outcome: Progressing   Problem: RH SKIN INTEGRITY Goal: RH STG SKIN FREE OF INFECTION/BREAKDOWN Description: Skin will be free of infection/breakdown with min assist Outcome: Progressing Goal: RH STG MAINTAIN SKIN INTEGRITY WITH ASSISTANCE Description: STG Maintain Skin Integrity With min Assistance. Outcome: Progressing   Problem: RH SAFETY Goal: RH STG ADHERE TO SAFETY PRECAUTIONS W/ASSISTANCE/DEVICE Description: STG Adhere to Safety Precautions With min Assistance/Device. Outcome: Progressing   Problem: RH PAIN MANAGEMENT Goal: RH STG PAIN MANAGED AT OR BELOW PT'S PAIN GOAL Description: Pain will be managed at or below 3 out of 10 on pain with min assist Outcome: Progressing   Problem: RH KNOWLEDGE DEFICIT Goal: RH STG INCREASE KNOWLEDGE OF STROKE PROPHYLAXIS Description: Pt will be able to verbalize stoke prophylaxis from handouts provided with medications and indicators with min assist Outcome: Progressing   Problem: RH Vision Goal: RH LTG Vision (Specify) Outcome: Progressing

## 2019-12-06 NOTE — Progress Notes (Addendum)
[facility] PHYSICAL MEDICINE & REHABILITATION PROGRESS NOTE   Subjective/Complaints:  Seen in PT, no new c/os, remains aphasic but indicates he is getting more sensation LUE  ROS: Limited due to aphasia  Objective:   No results found. Recent Labs    12/04/19 0728  WBC 5.5  HGB 13.5  HCT 40.5  PLT 163   Recent Labs    12/04/19 0728  NA 138  K 4.3  CL 105  CO2 25  GLUCOSE 105*  BUN 14  CREATININE 1.07  CALCIUM 9.2    Intake/Output Summary (Last 24 hours) at 12/06/2019 0933 Last data filed at 12/06/2019 0800 Gross per 24 hour  Intake 660 ml  Output --  Net 660 ml     Physical Exam: Vital Signs Blood pressure (!) 111/59, pulse (!) 52, temperature 98 F (36.7 C), resp. rate 15, height 5\' 10"  (1.778 m), weight 82 kg, SpO2 97 %.   General: No acute distress Mood and affect are appropriate Heart: Regular rate and rhythm no rubs murmurs or extra sounds Lungs: Clear to auscultation, breathing unlabored, no rales or wheezes Abdomen: Positive bowel sounds, soft nontender to palpation, nondistended Extremities: No clubbing, cyanosis, or edema Skin: No evidence of breakdown, no evidence of rash  Neuro: Alert Expressive > receptive aphasia, unchanged LUE: 5/5 proximal distal LLE: Hip flexion, knee extension 4-4+/5, ankle dorsiflexion 1/5 RUE: Shoulder abduction 3-/5, elbow flexion/extension 3/5, wrist extension 0/5, handgrip 3/5, overall limited due to apraxia, improved RLE: Hip flexion, knee extension 4-/5, ankle dorsiflexion 0/5, improved  Assessment/Plan:  1. 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  Physiatrist is providing close team supervision and 24 hour management of active medical problems listed below.  Physiatrist and rehab team continue to assess barriers to  discharge/monitor patient progress toward functional and medical goals  Care Tool:  Bathing  Bathing activity did not occur:  (patient refused) Body parts bathed by patient: Right arm, Chest, Abdomen, Front perineal area, Right upper leg, Left upper leg, Face, Left arm, Right lower leg, Left lower leg   Body parts bathed by helper: Buttocks     Bathing assist Assist Level: Minimal Assistance - Patient > 75%     Upper Body Dressing/Undressing Upper body dressing   What is the patient wearing?: Pull over shirt    Upper body assist Assist Level: Supervision/Verbal cueing    Lower Body Dressing/Undressing Lower body dressing      What is the patient wearing?: Pants, Incontinence brief     Lower body assist Assist for lower body dressing: Moderate Assistance - Patient 50 - 74%     Toileting Toileting    Toileting assist Assist for toileting: Moderate Assistance - Patient 50 - 74%     Transfers Chair/bed transfer  Transfers assist     Chair/bed transfer assist level: Minimal Assistance - Patient > 75%     Locomotion Ambulation   Ambulation assist      Assist level: Minimal Assistance - Patient > 75% Assistive device: Walker-rolling Max distance: 151ft   Walk 10 feet activity   Assist     Assist level: Minimal Assistance - Patient > 75% Assistive device: Walker-rolling   Walk 50 feet activity   Assist Walk 50 feet with 2 turns activity did not occur: Safety/medical concerns  Assist level: Minimal Assistance -  Patient > 75% Assistive device: Walker-rolling    Walk 150 feet activity   Assist Walk 150 feet activity did not occur: Safety/medical concerns         Walk 10 feet on uneven surface  activity   Assist Walk 10 feet on uneven surfaces activity did not occur: Safety/medical concerns         Wheelchair     Assist Will patient use wheelchair at discharge?: Yes Type of Wheelchair: Manual    Wheelchair assist level:  Supervision/Verbal cueing Max wheelchair distance: 175    Wheelchair 50 feet with 2 turns activity    Assist        Assist Level: Supervision/Verbal cueing   Wheelchair 150 feet activity     Assist      Assist Level: Supervision/Verbal cueing   Blood pressure (!) 111/59, pulse (!) 52, temperature 98 F (36.7 C), resp. rate 15, height 5\' 10"  (1.778 m), weight 82 kg, SpO2 97 %.  Medical Problem List and Plan: 1.  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  Antithrombotics: -DVT/anticoagulation: Lovenox             -antiplatelet therapy: Plavix 75 mg daily 3. Pain Management: Tylenol as needed No pain complaints 4. Mood: Provide emotional support             -antipsychotic agents: N/A 5. Neuropsych: This patient is not fully capable of making decisions on his own behalf with accommodations for aphasia  6. Skin/Wound Care: Routine skin checks 7. Fluids/Electrolytes/Nutrition: Routine in and outs.    BMP 10/25 normal 8.  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  Follow-up outpatient 9.  Hyperlipidemia.    Continue Lipitor 10.  BPH.    Resumed tamsulosin  Controlled 11.  no hx HTN BPs in ml range may be a bit low from tamsulosin   Vitals:   12/05/19 1935 12/06/19 0440  BP: 125/67 (!) 111/59  Pulse: 61 (!) 52  Resp: 16 15  Temp: 97.9 F (36.6 C) 98 F (36.7 C)  SpO2: 100% 97%   Controlled on 10/27, HRs satble in 50-60s without sx of dizziness  12.  Slow transit constipation  Bowel meds increased on 10/18, increased again on 10/23, increased again on 10/24 13.  Thrombocytopenia,improved , 163K back  in normal range  Continue to monitor   LOS: 19 days A FACE TO FACE EVALUATION WAS PERFORMED  Charlett Blake 12/06/2019, 9:33 AM

## 2019-12-06 NOTE — Telephone Encounter (Addendum)
Pt's wife returned my call but had to leave message since I was off.  She said patient had a severe stroke and would like to hold off on scheduling an ov to set up procedure.  Tried calling her back to let her know that I received the information and to see how he was doing but had to leave a message on voice mail.

## 2019-12-06 NOTE — Progress Notes (Signed)
Speech Language Pathology Daily Session Note  Patient Details  Name: Aidenn Skellenger MRN: [id number] Date of Birth: [birth year]  Today's Date: 12/06/2019 SLP Individual Time: 1345-1411 SLP Individual Time Calculation (min): 26 min  Short Term Goals: Week 3: SLP Short Term Goal 1 (Week 3): STG=LTG due to remaining length of stay  Skilled Therapeutic Interventions: Pt was seen for skilled ST targeting education with pt and his girlfriend, [name] (primary caregiver). SLP facilitated session with review of ST goals and progress. Verbal review including concrete examples of phonemic, semantic, and sentence completion cues provided to [name], in addition to handouts with review of this info and additional strategies. Discussed how expressive language skills are more impaired than his receptive language skills at this point, but reviewed strategies to assist with comprehension of information beyond basic. Also discussed home activities and current functioning with reading and writing skills. Reinforced recommendations for 24/7 supervision due to speech and cognitive deficits and continued skilled ST interventions with likely slow but steady progress, as he has shown while inpatient. All questions answered to [name] and [employer] satisfaction. Pt left sitting in commode with girlfriend still present and instructed to call for assistance prior to transferring back to chair. RN and NT aware pt on commode. Continue per current plan of care.          Pain Pain Assessment Pain Scale: 0-10 Pain Score: 0-No pain  Therapy/Group: Individual Therapy  Arbutus Leas 12/06/2019, 3:02 PM

## 2019-12-06 NOTE — Progress Notes (Signed)
Physical Therapy Session Note  Patient Details  Name: Sye Schroepfer MRN: [id number] Date of Birth: 1950/09/09  Today's Date: 12/06/2019 PT Individual Time: [phone] and 1415-1510 PT Individual Time Calculation (min): 32 min and 55 min    Short Term Goals: Week 3:  PT Short Term Goal 1 (Week 3): STG=LTG due to ELOS  Skilled Therapeutic Interventions/Progress Updates:  Session 1 Pt received supine in bed and agreeable to PT. Supine>sit transfer with supervision assist and cues for awareness of the RLE. PT assisted pt to don BLE AFO and Shoes for time management. Stand pivot transfer to [facility] with RW, hand splint, and min assist initially due to posterior bias.   Pt transported to day room in [facility]. Gait training with RW and hand splint with close supervision assist x 140f with min cues for AD management in turns and improved step height on the R.   dynaminc balance training to perform lateral reach to the L and R x 8 to obtain bean bag from tray table and then throw to target with the LUE. Mod assist on the R side to guide lateral reach of the UE and provide cues for sequencing for reach.   [facility] and all needs met.         Session 2.   Pt received sitting in WC and agreeable to PT. Pt's SO present for education. WC mobility through hall with supervision assist through hall x 1365fwith min cues for awareness of obstacles on the R side. No assist required from PT.    Pt performed gait training with CGA-supervision assist from PT and then from wife 2x [id number] for posture and sequencing in turns.   Car transfer training with min assist from PT and then from SO. Cues from PT for safe guarding technique behind and to the right  As well as improved verbal instruction for awareness of RUE and safety with AD management.   BSC transfer training into bathroom with toilet on the R side to simulate home environment. Min A from SO with  min cues from PT for improved set up and positioning as well as AD management to reduce fall risk.  Patient returned to room and [facility] reach and all needs met.            Therapy Documentation Precautions:  Precautions Precautions: Fall Required Braces or Orthoses: Other Brace Other Brace: bilateral AFO's in shoes Restrictions Weight Bearing Restrictions: No Vital Signs: Therapy Vitals Temp: 98 F (36.7 C) Pulse Rate: (!) 52 Resp: 15 BP: (!) 111/59 Patient Position (if appropriate): Lying Oxygen Therapy SpO2: 97 % O2 Device: Room Air Pain:   Mobility:   Locomotion :    Trunk/Postural Assessment :    Balance:   Exercises:   Other Treatments:      Therapy/Group: Individual Therapy  AusLorie Phenix/27/2021, 8:35 AM

## 2019-12-06 NOTE — Telephone Encounter (Signed)
Noted.  See other phone note.

## 2019-12-07 ENCOUNTER — Inpatient Hospital Stay (HOSPITAL_COMMUNITY): Payer: PPO | Admitting: Physical Therapy

## 2019-12-07 ENCOUNTER — Inpatient Hospital Stay (HOSPITAL_COMMUNITY): Payer: PPO | Admitting: Occupational Therapy

## 2019-12-07 ENCOUNTER — Inpatient Hospital Stay (HOSPITAL_COMMUNITY): Payer: PPO | Admitting: Speech Pathology

## 2019-12-07 MED ORDER — TAMSULOSIN HCL 0.4 MG PO CAPS: 0.8000 mg | ORAL_CAPSULE | Freq: Every day | ORAL | 1 refills | Status: DC

## 2019-12-07 MED ORDER — POLYETHYLENE GLYCOL 3350 17 G PO PACK: 17.0000 g | PACK | Freq: Two times a day (BID) | ORAL | 0 refills | Status: DC

## 2019-12-07 MED ORDER — PANTOPRAZOLE SODIUM 40 MG PO TBEC: 40.0000 mg | DELAYED_RELEASE_TABLET | Freq: Every day | ORAL | 1 refills | Status: DC

## 2019-12-07 MED ORDER — ATORVASTATIN CALCIUM 40 MG PO TABS: 40.0000 mg | ORAL_TABLET | Freq: Every day | ORAL | 0 refills | Status: DC

## 2019-12-07 MED ORDER — CLOPIDOGREL BISULFATE 75 MG PO TABS: 75.0000 mg | ORAL_TABLET | Freq: Every day | ORAL | 0 refills | Status: DC

## 2019-12-07 NOTE — Progress Notes (Signed)
Occupational Therapy Discharge Summary  Patient Details  Name: Philip Stone MRN: [id number] Date of Birth: 02-11-1950  Today's Date: 12/07/2019 OT Individual Time: 1638-4536 OT Individual Time Calculation (min): 58 min   Session Note:  Pt completed showering and dressing during session.  Min assist for functional mobility to the shower from the wheelchair at bedside, with use of the RW for support.  He was able to remove all clothing with supervision for UB and mod assist for LB.  He completed all bathing with min assist sit to stand and use of the grab bars for support.  Dressing was performed at the sink with setup for UB and mod assist overall for LB.  He demonstrates decreased ability to donn his socks, braces, or shoes, so max facilitation is needed for this.  He is able to donn his brief and pants with overall mod assist.  He utilized the RUE for stabilizing objects to be opened with supervision but needed mod assist to integrate for application of deodorant under the left arm or when reaching to place it back at the sink.  Continued OT recommended at discharge with 24 hour supervision/assist for safety.  He was left sitting up in the wheelchair with call button and phone in reach and safety belt in place.   Patient has met 8 of 12 long term goals due to improved activity tolerance, ability to compensate for deficits, functional use of  RIGHT upper and RIGHT lower extremity, improved attention, improved awareness and improved coordination.  Patient to discharge at [facility] Assist level.  Patient's care partner is independent to provide the necessary physical and cognitive assistance at discharge.    Reasons goals not met: Pt needs min assist for toileting, bathing, dressing tasks overall  Recommendation:  Patient will benefit from ongoing skilled OT services in home health setting to continue to advance functional skills in the area of BADL and Reduce care partner burden.  Pt will continue  to benefit from continued HHOT to further progress RUE function, ADL independence, balance, visual compensation and cognition.  Recommend 24 hour assist initially to be provided by his significant other.   Equipment: tub bench  Reasons for discharge: treatment goals met and discharge from hospital  Patient/family agrees with progress made and goals achieved: Yes  OT Discharge Precautions/Restrictions  Precautions Precautions: Fall Precaution Comments: right hemiparesis Required Braces or Orthoses: Other Brace Other Brace: bilateral AFO's in shoes  Pain Pain Assessment Pain Scale: Faces Pain Score: 0-No pain ADL ADL Eating: Set up Where Assessed-Eating: Wheelchair Grooming: Supervision/safety Where Assessed-Grooming: Wheelchair Upper Body Bathing: Supervision/safety Where Assessed-Upper Body Bathing: Shower Lower Body Bathing: Minimal assistance Where Assessed-Lower Body Bathing: Shower Upper Body Dressing: Setup Where Assessed-Upper Body Dressing: Wheelchair Lower Body Dressing: Moderate assistance Where Assessed-Lower Body Dressing: Wheelchair, Standing at sink, Sitting at sink Toileting: Minimal assistance Where Assessed-Toileting: Bedside Commode Toilet Transfer: Minimal assistance Toilet Transfer Method: Counselling psychologist: Technical brewer: Facilities manager Method: Heritage manager: Radio broadcast assistant Vision Baseline Vision/History: Wears glasses Wears Glasses: At all times Patient Visual Report: No change from baseline Vision Assessment?: Yes Eye Alignment: Within Functional Limits Alignment/Gaze Preference: Within Defined Limits Tracking/Visual Pursuits: Decreased smoothness of eye movement to RIGHT superior field;Decreased smoothness of eye movement to RIGHT inferior field Saccades: Additional eye shifts occurred during testing Visual Fields: Right homonymous  hemianopsia Perception  Perception: Impaired Inattention/Neglect: Does not attend to right visual field Praxis Praxis: Impaired Praxis Impairment  Details: Motor planning;Perseveration;Ideation;Ideomotor Cognition Overall Cognitive Status: Impaired/Different from baseline Arousal/Alertness: Awake/alert Attention: Sustained Focused Attention: Appears intact Sustained Attention: Appears intact Selective Attention: Impaired Selective Attention Impairment: Functional basic;Functional complex Memory: Impaired Memory Impairment: Decreased short term memory;Retrieval deficit;Decreased recall of new information Decreased Short Term Memory: Verbal basic;Functional basic Awareness: Impaired Awareness Impairment: Emergent impairment;Anticipatory impairment Problem Solving: Impaired Self Monitoring: Impaired Self Monitoring Impairment: Verbal basic Self Correcting: Impaired Self Correcting Impairment: Verbal basic Safety/Judgment: Impaired Sensation Sensation Light Touch: Impaired Detail Light Touch Impaired Details: Absent RUE;Absent RLE Hot/Cold: Impaired Detail Hot/Cold Impaired Details: Absent RUE;Absent RLE Proprioception: Impaired Detail Proprioception Impaired Details: Impaired RLE;Impaired RUE Stereognosis: Not tested Coordination Gross Motor Movements are Fluid and Coordinated: No Fine Motor Movements are Fluid and Coordinated: No Coordination and Movement Description: Pt with increased RUE hemiparesis with slight increased tone in the elbow as well as the digit flexors.  He needs mod to max assist for integration at an active assist during selfcare tasks, but can use as a gross assist with supervision. Motor  Motor Motor: Hemiplegia;Motor apraxia;Ataxia Motor - Discharge Observations: mild R side hemiplegia and ataxia,apraxia with novel tasks Mobility  Transfers Sit to Stand: Supervision/Verbal cueing Stand to Sit: Supervision/Verbal cueing  Trunk/Postural Assessment   Cervical Assessment Cervical Assessment: Within Functional Limits Thoracic Assessment Thoracic Assessment: Exceptions to [facility] (thoracic rounding) Lumbar Assessment Lumbar Assessment: Exceptions to [facility] (posterior pelvic tilt)  Balance Balance Balance Assessed: Yes Static Sitting Balance Static Sitting - Balance Support: Feet supported Static Sitting - Level of Assistance: 6: Modified independent (Device/Increase time) Dynamic Sitting Balance Dynamic Sitting - Balance Support: During functional activity Dynamic Sitting - Level of Assistance: 5: Stand by assistance Static Standing Balance Static Standing - Balance Support: During functional activity Static Standing - Level of Assistance: Other (comment) (contact guard) Dynamic Standing Balance Dynamic Standing - Balance Support: During functional activity Dynamic Standing - Level of Assistance: 4: Min assist Extremity/Trunk Assessment RUE Assessment RUE Assessment: Exceptions to [facility] Passive Range of Motion (PROM) Comments: increased flexor tone in the elbow and finger flexors but can be fully ranged.  Shoulder flexion 0-120 degrees with increased pain noted at end range. General Strength Comments: Pt is currently Brunnstrum stage IV in the right arm and stage V in the hand.  He can demonstrate full AROM digit flexion and extension but only in a mass movement not for FM coordination.  He maintains digit flexion at rest secondary to increased tone .  Decreased motor planning noted with functional use LUE Assessment LUE Assessment: Within Functional Limits Passive Range of Motion (PROM) Comments: WFL Active Range of Motion (AROM) Comments: WFL General Strength Comments: 4+/5   Tresten Pantoja OTR/L 12/07/2019, 5:15 PM

## 2019-12-07 NOTE — Progress Notes (Signed)
Physical Therapy Discharge Summary  Patient Details  Name: Philip Stone MRN: [id number] Date of Birth: [birth year]  Today's Date: 12/07/2019 PT Individual Time: 0805-0920 PT Individual Time Calculation (min): 75 min    Patient has met 9 of 10 long term goals due to improved activity tolerance, improved balance, improved postural control, increased strength, increased range of motion, ability to compensate for deficits, functional use of  right upper extremity and right lower extremity, improved attention, improved awareness and improved coordination.  Patient to discharge at an ambulatory level [place].   Patient's care partner is independent to provide the necessary physical and cognitive assistance at discharge.  Reasons goals not met: All PT goals met   Recommendation:  Patient will benefit from ongoing skilled PT services in home health setting to continue to advance safe functional mobility, address ongoing impairments in balance, gait, transfers, coordination, awareness, safety and minimize fall risk.  Equipment: WC and RW  Reasons for discharge: treatment goals met and discharge from hospital  Patient/family agrees with progress made and goals achieved: Yes    PT treatment:  Pt received supine in bed and agreeable to PT. Supine>sit transfer without assist or cues. PT assisted pt to don Bil shoes and AFO. PT instructed pt in Grad day assessment to measure progress toward goals. See below for details. Gait training with RW x 157f, 228f and 1031fith CGA-supervision assist for R UE placement in hand splint. Car transfer training with min assist for RLE management. Stair training with mod assist and min-mod cues for sequencing and proper step to gait pattern. Patient returned to room and left sitting in WC [facility] call bell in reach and all needs met.        PT Discharge Precautions/Restrictions   fall R inattention/field cut  Pain Pain Assessment Pain Scale: 0-10 Pain Score:  0-No pain Vision/Perception  Vision - Assessment Eye Alignment: Within Functional Limits Tracking/Visual Pursuits: Decreased smoothness of eye movement to RIGHT superior field;Decreased smoothness of eye movement to RIGHT inferior field Saccades: Additional eye shifts occurred during testing Additional Comments: R inattention Perception Perception: Impaired Inattention/Neglect: Does not attend to right visual field;Does not attend to right side of body Praxis Praxis: Impaired Praxis Impairment Details: Motor planning;Perseveration;Ideation;Ideomotor Praxis-Other Comments: mild apraxia with novel tasks  Sensation Sensation Light Touch: Impaired Detail Light Touch Impaired Details: Absent RUE;Absent RLE Hot/Cold: Impaired Detail Proprioception: Impaired Detail Proprioception Impaired Details: Impaired RLE;Impaired RUE Coordination Gross Motor Movements are Fluid and Coordinated: No Fine Motor Movements are Fluid and Coordinated: No Coordination and Movement Description: R sided atacia Heel Shin Test: R sided ataxia Motor  Motor Motor: Hemiplegia;Motor apraxia;Ataxia Motor - Discharge Observations: mild R side hemiplegia and ataxia,apraxia with novel tasks  Mobility Bed Mobility Bed Mobility: Rolling Right;Rolling Left;Right Sidelying to Sit;Sit to Sidelying Right Rolling Right: Independent Rolling Left: Independent Right Sidelying to Sit: Independent Sit to Supine: Independent Sit to Sidelying Right: Independent Transfers Transfers: Sit to Stand;Stand to Sit;Squat Pivot Transfers;Stand Pivot Transfers Sit to Stand: Supervision/Verbal cueing Stand to Sit: Supervision/Verbal cueing Stand Pivot Transfers: Supervision/Verbal cueing;Contact Guard/Touching assist Stand Pivot Transfer Details: Tactile cues for posture;Visual cues for safe use of DME/AE;Verbal cues for sequencing;Verbal cues for technique;Verbal cues for precautions/safety Squat Pivot Transfers: Minimal Assistance -  Patient > 75% Transfer (Assistive device): Rolling walker (BAFO) Locomotion  Gait Ambulation: Yes Gait Assistance: Supervision/Verbal cueing;Contact Guard/Touching assist Gait Distance (Feet): 150 Feet Assistive device: Standard walker;Other (Comment) (BLE AFO and R hand splint) Gait Assistance Details: Verbal cues  for gait pattern;Verbal cues for safe use of DME/AE;Verbal cues for precautions/safety;Verbal cues for technique;Verbal cues for sequencing;Visual cues/gestures for precautions/safety;Visual cues for safe use of DME/AE Gait Gait: Yes Gait Pattern: Impaired Gait Pattern: Step-to pattern;Ataxic;Trunk flexed;Lateral hip instability;Left foot flat;Right foot flat Stairs / Additional Locomotion Stairs: Yes Stairs Assistance: Moderate Assistance - Patient 50 - 74% Stair Management Technique: One rail Left Number of Stairs: 4 Height of Stairs: 6 Wheelchair Mobility Wheelchair Mobility: Yes Wheelchair Assistance: Supervision/Verbal cueing Wheelchair Propulsion: Left upper extremity;Left lower extremity Wheelchair Parts Management: Supervision/cueing Distance: 150  Trunk/Postural Assessment  Cervical Assessment Cervical Assessment: Within Functional Limits Thoracic Assessment Thoracic Assessment: Exceptions to [facility] (rounded shoulders, mild R Lateral lean) Lumbar Assessment Lumbar Assessment: Exceptions to WFL (decreased lordosis) Postural Control Postural Control: Deficits on evaluation Righting Reactions: mild delays Postural Limitations: mild R lateral lean.  Balance Balance Balance Assessed: Yes Static Sitting Balance Static Sitting - Balance Support: Feet supported Static Sitting - Level of Assistance: 6: Modified independent (Device/Increase time) Dynamic Sitting Balance Dynamic Sitting - Balance Support: During functional activity Dynamic Sitting - Level of Assistance: 5: Stand by assistance Static Standing Balance Static Standing - Balance Support: During  functional activity Static Standing - Level of Assistance: Other (comment) (contact guard) Dynamic Standing Balance Dynamic Standing - Balance Support: During functional activity Dynamic Standing - Level of Assistance: 4: Min assist;5: Stand by assistance Extremity Assessment      RLE Assessment RLE Assessment: Exceptions to [facility] Active Range of Motion (AROM) Comments: lacking 10 deg knee extension with MMT General Strength Comments: grossly 4/5 functionally, inconsistent activation. except knee flexion 3/5. LLE Assessment LLE Assessment: Within Functional Limits General Strength Comments: but with premorbid AFO     Lorie Phenix 12/07/2019, 9:26 AM

## 2019-12-07 NOTE — Progress Notes (Signed)
Inpatient Rehabilitation Care Coordinator  Discharge Note  The overall goal for the admission was met for:   Discharge location: Yes, Home  Length of Stay: Yes, 21 Days  Discharge activity level: Yes, CGA-SUP  Home/community participation: Yes  Services provided included: MD, RD, PT, OT, SLP, RN, CM, TR, Pharmacy, [place]: Private Insurance: [employer]  Follow-up services arranged: Home Health: [place] (or additional information): PT OT [facility]  Patient/Family verbalized understanding of follow-up arrangements: Yes  Individual responsible for coordination of the follow-up plan: [name] [phone]  Confirmed correct DME delivered: Dyanne Iha 12/07/2019    Dyanne Iha

## 2019-12-07 NOTE — Discharge Instructions (Signed)
Inpatient Rehab Discharge Instructions  Philip Stone Discharge date and time: No discharge date for patient encounter.   Activities/Precautions/ Functional Status: Activity: activity as tolerated Diet: regular diet Wound Care: Routine skin checks Functional status:  ___ No restrictions     ___ Walk up steps independently ___ 24/7 supervision/assistance   ___ Walk up steps with assistance ___ Intermittent supervision/assistance  ___ Bathe/dress independently ___ Walk with walker     _x__ Bathe/dress with assistance ___ Walk Independently    ___ Shower independently ___ Walk with assistance    ___ Shower with assistance ___ No alcohol     ___ Return to work/school ________  Special Instructions:  COMMUNITY REFERRALS UPON DISCHARGE:    Home Health:   PT     OT     ST                  Agency: [facility] Phone: [phone]   Medical Equipment/Items Ordered: Wheelchair, Conservation officer, nature, Bedside Commode, Tub Producer, television/film/video                                                 Agency/Supplier: [employer]   No driving smoking or alcohol STROKE/TIA DISCHARGE INSTRUCTIONS SMOKING Cigarette smoking nearly doubles your risk of having a stroke & is the single most alterable risk factor  If you smoke or have smoked in the last 12 months, you are advised to quit smoking for your health.  Most of the excess cardiovascular risk related to smoking disappears within a year of stopping.  Ask you doctor about anti-smoking medications   Quit Line: 1-800-QUIT NOW  Free Smoking Cessation Classes [phone]  CHOLESTEROL Know your levels; limit fat & cholesterol in your diet  Lipid Panel     Component Value Date/Time   CHOL 186 11/16/2019 0449   CHOL 181 07/17/2019 1603   TRIG 44 11/16/2019 0449   HDL 52 11/16/2019 0449   HDL 42 07/17/2019 1603   CHOLHDL 3.6 11/16/2019 0449   VLDL 9 11/16/2019 0449   LDLCALC 125 (H) 11/16/2019 0449   LDLCALC 107 (H) 07/17/2019 1603       Many patients benefit from treatment even if their cholesterol is at goal.  Goal: Total Cholesterol (CHOL) less than 160  Goal:  Triglycerides (TRIG) less than 150  Goal:  HDL greater than 40  Goal:  LDL (LDLCALC) less than 100   BLOOD PRESSURE American Stroke Association blood pressure target is less that 120/80 mm/Hg  Your discharge blood pressure is:  BP: (!) 161/90  Monitor your blood pressure  Limit your salt and alcohol intake  Many individuals will require more than one medication for high blood pressure  DIABETES (A1c is a blood sugar average for last 3 months) Goal HGBA1c is under 7% (HBGA1c is blood sugar average for last 3 months)  Diabetes: No known diagnosis of diabetes    Lab Results  Component Value Date   HGBA1C 5.6 11/16/2019     Your HGBA1c can be lowered with medications, healthy diet, and exercise.  Check your blood sugar as directed by your physician  Call your physician if you experience unexplained or low blood sugars.  PHYSICAL ACTIVITY/REHABILITATION Goal is 30 minutes at least 4 days per week  Activity: Increase activity slowly, Therapies: Physical Therapy: Home Health Return to work:  Activity decreases your risk of heart attack and stroke and makes your heart stronger.  It helps control your weight and blood pressure; helps you relax and can improve your mood.  Participate in a regular exercise program.  Talk with your doctor about the best form of exercise for you (dancing, walking, swimming, cycling).  DIET/WEIGHT Goal is to maintain a healthy weight  Your discharge diet is:  Diet Order            Diet Heart Room service appropriate? Yes; Fluid consistency: Thin  Diet effective now                 liquids Your height is:    Your current weight is:   Your Body Mass Index (BMI) is:     Following the type of diet specifically designed for you will help prevent another stroke.  Your goal weight range is:    Your goal Body Mass  Index (BMI) is 19-24.  Healthy food habits can help reduce 3 risk factors for stroke:  High cholesterol, hypertension, and excess weight.  RESOURCES Stroke/Support Group:  Call [phone]   STROKE EDUCATION PROVIDED/REVIEWED AND GIVEN TO PATIENT Stroke warning signs and symptoms How to activate emergency medical system (call 911). Medications prescribed at discharge. Need for follow-up after discharge. Personal risk factors for stroke. Pneumonia vaccine given:  Flu vaccine given:  My questions have been answered, the writing is legible, and I understand these instructions.  I will adhere to these goals & educational materials that have been provided to me after my discharge from the hospital.     My questions have been answered and I understand these instructions. I will adhere to these goals and the provided educational materials after my discharge from the hospital.  Patient/Caregiver Signature _______________________________ Date __________  Clinician Signature _______________________________________ Date __________  Please bring this form and your medication list with you to all your follow-up doctor's appointments.

## 2019-12-07 NOTE — Plan of Care (Signed)
  Problem: Consults Goal: RH STROKE PATIENT EDUCATION Description: See Patient Education module for education specifics  Outcome: Progressing   Problem: RH BOWEL ELIMINATION Goal: RH STG MANAGE BOWEL WITH ASSISTANCE Description: STG Manage Bowel with min Assistance. Outcome: Progressing   Problem: RH BLADDER ELIMINATION Goal: RH STG MANAGE BLADDER WITH ASSISTANCE Description: STG Manage Bladder With min Assistance Outcome: Progressing   Problem: RH SKIN INTEGRITY Goal: RH STG SKIN FREE OF INFECTION/BREAKDOWN Description: Skin will be free of infection/breakdown with min assist Outcome: Progressing Goal: RH STG MAINTAIN SKIN INTEGRITY WITH ASSISTANCE Description: STG Maintain Skin Integrity With min Assistance. Outcome: Progressing   Problem: RH SAFETY Goal: RH STG ADHERE TO SAFETY PRECAUTIONS W/ASSISTANCE/DEVICE Description: STG Adhere to Safety Precautions With min Assistance/Device. Outcome: Progressing   Problem: RH PAIN MANAGEMENT Goal: RH STG PAIN MANAGED AT OR BELOW PT'S PAIN GOAL Description: Pain will be managed at or below 3 out of 10 on pain with min assist Outcome: Progressing   Problem: RH KNOWLEDGE DEFICIT Goal: RH STG INCREASE KNOWLEDGE OF STROKE PROPHYLAXIS Description: Pt will be able to verbalize stoke prophylaxis from handouts provided with medications and indicators with min assist Outcome: Progressing   Problem: RH Vision Goal: RH LTG Vision (Specify) Outcome: Progressing

## 2019-12-08 LAB — CREATININE, SERUM
Creatinine, Ser: 1.08 mg/dL (ref 0.61–1.24)
GFR, Estimated: >60 mL/min (ref >60)

## 2019-12-08 NOTE — Plan of Care (Signed)
  Problem: RH Stairs Goal: LTG Patient will ambulate up and down stairs w/assist (PT) Description: LTG: Patient will ambulate up and down # of stairs with assistance (PT) Outcome: Adequate for Discharge   Problem: RH Balance Goal: LTG Patient will maintain dynamic sitting balance (PT) Description: LTG:  Patient will maintain dynamic sitting balance with assistance during mobility activities (PT) Outcome: Completed/Met Goal: LTG Patient will maintain dynamic standing balance (PT) Description: LTG:  Patient will maintain dynamic standing balance with assistance during mobility activities (PT) Outcome: Completed/Met   Problem: Sit to Stand Goal: LTG:  Patient will perform sit to stand with assistance level (PT) Description: LTG:  Patient will perform sit to stand with assistance level (PT) Outcome: Completed/Met   Problem: RH Bed Mobility Goal: LTG Patient will perform bed mobility with assist (PT) Description: LTG: Patient will perform bed mobility with assistance, with/without cues (PT). Outcome: Completed/Met   Problem: RH Bed to Chair Transfers Goal: LTG Patient will perform bed/chair transfers w/assist (PT) Description: LTG: Patient will perform bed to chair transfers with assistance (PT). Outcome: Completed/Met   Problem: RH Car Transfers Goal: LTG Patient will perform car transfers with assist (PT) Description: LTG: Patient will perform car transfers with assistance (PT). Outcome: Completed/Met   Problem: RH Ambulation Goal: LTG Patient will ambulate in controlled environment (PT) Description: LTG: Patient will ambulate in a controlled environment, # of feet with assistance (PT). Outcome: Completed/Met Goal: LTG Patient will ambulate in home environment (PT) Description: LTG: Patient will ambulate in home environment, # of feet with assistance (PT). Outcome: Completed/Met   Problem: RH Wheelchair Mobility Goal: LTG Patient will propel w/c in controlled environment  (PT) Description: LTG: Patient will propel wheelchair in controlled environment, # of feet with assist (PT) Outcome: Completed/Met

## 2019-12-08 NOTE — Progress Notes (Signed)
[facility] PHYSICAL MEDICINE & REHABILITATION PROGRESS NOTE   Subjective/Complaints:  No issues overnite  ROS: Limited due to aphasia  Objective:   No results found. No results for input(s): WBC, HGB, HCT, PLT in the last 72 hours. Recent Labs    12/08/19 0408  CREATININE 1.08    Intake/Output Summary (Last 24 hours) at 12/08/2019 0918 Last data filed at 12/08/2019 0508 Gross per 24 hour  Intake 458 ml  Output 200 ml  Net 258 ml     Physical Exam: Vital Signs Blood pressure (!) 100/55, pulse (!) 51, temperature 97.6 F (36.4 C), resp. rate 16, height 5\' 10"  (1.778 m), weight 82 kg, SpO2 95 %.   General: No acute distress Mood and affect are appropriate Heart: Regular rate and rhythm no rubs murmurs or extra sounds Lungs: Clear to auscultation, breathing unlabored, no rales or wheezes Abdomen: Positive bowel sounds, soft nontender to palpation, nondistended Extremities: No clubbing, cyanosis, or edema Skin: No evidence of breakdown, no evidence of rash  Neuro: Alert Expressive > receptive aphasia, unchanged LUE: 5/5 proximal distal LLE: Hip flexion, knee extension 4-4+/5, ankle dorsiflexion 1/5 RUE: Shoulder abduction 3-/5, elbow flexion/extension 3/5, wrist extension 0/5, handgrip 3/5, overall limited due to apraxia, improved RLE: Hip flexion, knee extension 4-/5, ankle dorsiflexion 0/5, improved  Assessment/Plan:  1. 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  Physiatrist is providing close team supervision and 24 hour management of active medical problems listed below.  Physiatrist and rehab team continue to assess barriers to discharge/monitor patient progress toward functional and medical goals  Care Tool:  Bathing  Bathing activity did not occur:  (patient  refused) Body parts bathed by patient: Right arm, Chest, Abdomen, Front perineal area, Right upper leg, Left upper leg, Face, Left arm, Left lower leg   Body parts bathed by helper: Buttocks, Right lower leg     Bathing assist Assist Level: Minimal Assistance - Patient > 75%     Upper Body Dressing/Undressing Upper body dressing   What is the patient wearing?: Pull over shirt    Upper body assist Assist Level: Supervision/Verbal cueing    Lower Body Dressing/Undressing Lower body dressing      What is the patient wearing?: Pants, Incontinence brief     Lower body assist Assist for lower body dressing: Minimal Assistance - Patient > 75%     Toileting Toileting    Toileting assist Assist for toileting: Minimal Assistance - Patient > 75%     Transfers Chair/bed transfer  Transfers assist     Chair/bed transfer assist level: Supervision/Verbal cueing     Locomotion Ambulation   Ambulation assist      Assist level: Minimal Assistance - Patient > 75% Assistive device: Walker-rolling Max distance: 10'   Walk 10 feet activity   Assist     Assist level: Supervision/Verbal cueing Assistive device: Orthosis, Walker-rolling   Walk 50 feet activity   Assist Walk 50 feet with 2 turns activity did not occur: Safety/medical concerns  Assist level: Supervision/Verbal cueing Assistive device: Walker-rolling, Orthosis    Walk 150 feet activity   Assist Walk 150 feet activity did not occur: Safety/medical concerns  Assist level: Supervision/Verbal cueing Assistive device: Walker-rolling    Walk 10 feet on uneven surface  activity   Assist Walk 10 feet on uneven surfaces  activity did not occur: Safety/medical concerns   Assist level: Minimal Assistance - Patient > 75% Assistive device: Walker-rolling, Orthosis   Wheelchair     Assist Will patient use wheelchair at discharge?: Yes Type of Wheelchair: Manual    Wheelchair assist level:  Supervision/Verbal cueing Max wheelchair distance: 175    Wheelchair 50 feet with 2 turns activity    Assist        Assist Level: Supervision/Verbal cueing   Wheelchair 150 feet activity     Assist      Assist Level: Supervision/Verbal cueing   Blood pressure (!) 100/55, pulse (!) 51, temperature 97.6 F (36.4 C), resp. rate 16, height 5\' 10"  (1.778 m), weight 82 kg, SpO2 95 %.  Medical Problem List and Plan: 1.  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  Antithrombotics: -DVT/anticoagulation: Lovenox             -antiplatelet therapy: Plavix 75 mg daily 3. Pain Management: Tylenol as needed No pain complaints 4. Mood: Provide emotional support             -antipsychotic agents: N/A 5. Neuropsych: This patient is not fully capable of making decisions on his own behalf with accommodations for aphasia  6. Skin/Wound Care: Routine skin checks 7. Fluids/Electrolytes/Nutrition: Routine in and outs.    BMP 10/25 normal 8.  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  Follow-up outpatient 9.  Hyperlipidemia.    Continue Lipitor 10.  BPH.    Resumed tamsulosin  Controlled 11.  no hx HTN BPs in ml range may be a bit low from tamsulosin   Vitals:   12/07/19 2150 12/08/19 0510  BP: 108/66 (!) 100/55  Pulse: (!) 49 (!) 51  Resp: 16 16  Temp: 98.2 F (36.8 C) 97.6 F (36.4 C)  SpO2: 98% 95%   Controlled on 10/27, HRs satble in 50-60s without sx of dizziness  12.  Slow transit constipation  Bowel meds increased on 10/18, increased again on 10/23, increased again on 10/24 13.  Thrombocytopenia,improved , 163K  back in normal range  Continue to monitor   LOS: 21 days A FACE TO FACE EVALUATION WAS PERFORMED  Charlett Blake 12/08/2019, 9:18 AM

## 2019-12-08 NOTE — Plan of Care (Signed)
Patient discharged via wheelchair by staff with significant other by side, Denies pain at this. Voices understanding of discharge instructions.

## 2019-12-09 DIAGNOSIS — M21371 Foot drop, right foot: Secondary | ICD-10-CM | POA: Diagnosis not present

## 2019-12-09 DIAGNOSIS — R200 Anesthesia of skin: Secondary | ICD-10-CM | POA: Diagnosis not present

## 2019-12-09 DIAGNOSIS — M4802 Spinal stenosis, cervical region: Secondary | ICD-10-CM | POA: Diagnosis not present

## 2019-12-09 DIAGNOSIS — I69398 Other sequelae of cerebral infarction: Secondary | ICD-10-CM | POA: Diagnosis not present

## 2019-12-09 DIAGNOSIS — I69351 Hemiplegia and hemiparesis following cerebral infarction affecting right dominant side: Secondary | ICD-10-CM | POA: Diagnosis not present

## 2019-12-09 DIAGNOSIS — I69390 Apraxia following cerebral infarction: Secondary | ICD-10-CM | POA: Diagnosis not present

## 2019-12-09 DIAGNOSIS — H53461 Homonymous bilateral field defects, right side: Secondary | ICD-10-CM | POA: Diagnosis not present

## 2019-12-09 DIAGNOSIS — I119 Hypertensive heart disease without heart failure: Secondary | ICD-10-CM | POA: Diagnosis not present

## 2019-12-09 DIAGNOSIS — K5901 Slow transit constipation: Secondary | ICD-10-CM | POA: Diagnosis not present

## 2019-12-09 DIAGNOSIS — D696 Thrombocytopenia, unspecified: Secondary | ICD-10-CM | POA: Diagnosis not present

## 2019-12-09 DIAGNOSIS — I69320 Aphasia following cerebral infarction: Secondary | ICD-10-CM | POA: Diagnosis not present

## 2019-12-09 DIAGNOSIS — G600 Hereditary motor and sensory neuropathy: Secondary | ICD-10-CM | POA: Diagnosis not present

## 2019-12-09 DIAGNOSIS — E785 Hyperlipidemia, unspecified: Secondary | ICD-10-CM | POA: Diagnosis not present

## 2019-12-11 DIAGNOSIS — M4802 Spinal stenosis, cervical region: Secondary | ICD-10-CM | POA: Diagnosis not present

## 2019-12-11 DIAGNOSIS — H53461 Homonymous bilateral field defects, right side: Secondary | ICD-10-CM | POA: Diagnosis not present

## 2019-12-11 DIAGNOSIS — I69390 Apraxia following cerebral infarction: Secondary | ICD-10-CM | POA: Diagnosis not present

## 2019-12-11 DIAGNOSIS — I69320 Aphasia following cerebral infarction: Secondary | ICD-10-CM | POA: Diagnosis not present

## 2019-12-11 DIAGNOSIS — I119 Hypertensive heart disease without heart failure: Secondary | ICD-10-CM | POA: Diagnosis not present

## 2019-12-11 DIAGNOSIS — E785 Hyperlipidemia, unspecified: Secondary | ICD-10-CM | POA: Diagnosis not present

## 2019-12-11 DIAGNOSIS — R200 Anesthesia of skin: Secondary | ICD-10-CM | POA: Diagnosis not present

## 2019-12-11 DIAGNOSIS — I69398 Other sequelae of cerebral infarction: Secondary | ICD-10-CM | POA: Diagnosis not present

## 2019-12-11 DIAGNOSIS — K5901 Slow transit constipation: Secondary | ICD-10-CM | POA: Diagnosis not present

## 2019-12-11 DIAGNOSIS — G600 Hereditary motor and sensory neuropathy: Secondary | ICD-10-CM | POA: Diagnosis not present

## 2019-12-11 DIAGNOSIS — D696 Thrombocytopenia, unspecified: Secondary | ICD-10-CM | POA: Diagnosis not present

## 2019-12-11 DIAGNOSIS — I69351 Hemiplegia and hemiparesis following cerebral infarction affecting right dominant side: Secondary | ICD-10-CM | POA: Diagnosis not present

## 2019-12-11 DIAGNOSIS — M21371 Foot drop, right foot: Secondary | ICD-10-CM | POA: Diagnosis not present

## 2019-12-12 ENCOUNTER — Telehealth: Payer: Self-pay

## 2019-12-12 NOTE — Telephone Encounter (Signed)
Transitional Care Call--who you spoke with Mr. Victorino Lves  1. Are you/is patient experiencing any problems since coming home? No problems.  Are there any questions regarding any aspect of care? None.  2. Are there any questions regarding medications administration/dosing? No questions.  Are meds being taken as prescribed? Yes/ Patient should review meds with caller to confirm. Med list reviewed and confirmed. 3. Have there been any falls? None.  4. Has Home Health been to the house and/or have they contacted you? Yes [facility] has visited.  If not, have you tried to contact them? N/A  Can we help you contact them? No.  5. Are bowels and bladder emptying properly? No problem.  Are there any unexpected incontinence issues? No problem.  If applicable, is patient following bowel/bladder programs? No issues.  6. Any fevers, problems with breathing, unexpected pain? No problems.  7. Are there any skin problems or new areas of breakdown? No problems. 8. Has the patient/family member arranged specialty MD follow up (ie cardiology/neurology/renal/surgical/etc)? [facility] phone number [phone] given. Also  details to schedule the appointment.  Can we help arrange? No. Advised patient or Sgo to call back if needed, for assistance.  9. Does the patient need any other services or support that we can help arrange? No. 10. Are caregivers following through as expected in assisting the patient? Yes.         11. Has the patient quit smoking, drinking alcohol, or using drugs as recommended? Per patient and Sgo he does not indulge.   Appointment Date/Time/ Arrival time/ and who they are seeing [place]

## 2019-12-14 ENCOUNTER — Telehealth: Payer: Self-pay

## 2019-12-14 NOTE — Telephone Encounter (Signed)
Shirlee Latch, speech therapist with [facility] call to verify if Dr. Livia Snellen is alright with continuing therapy.  She is suggesting 1 week one, 2 week 2 and then 1 week one. Called her back with approval.  [phone]

## 2019-12-21 ENCOUNTER — Other Ambulatory Visit: Payer: Self-pay

## 2019-12-21 ENCOUNTER — Ambulatory Visit (INDEPENDENT_AMBULATORY_CARE_PROVIDER_SITE_OTHER): Payer: PPO

## 2019-12-21 DIAGNOSIS — K5901 Slow transit constipation: Secondary | ICD-10-CM

## 2019-12-21 DIAGNOSIS — M4802 Spinal stenosis, cervical region: Secondary | ICD-10-CM | POA: Diagnosis not present

## 2019-12-21 DIAGNOSIS — R599 Enlarged lymph nodes, unspecified: Secondary | ICD-10-CM

## 2019-12-21 DIAGNOSIS — R200 Anesthesia of skin: Secondary | ICD-10-CM | POA: Diagnosis not present

## 2019-12-21 DIAGNOSIS — M21371 Foot drop, right foot: Secondary | ICD-10-CM

## 2019-12-21 DIAGNOSIS — D600 Chronic acquired pure red cell aplasia: Secondary | ICD-10-CM | POA: Diagnosis not present

## 2019-12-21 DIAGNOSIS — I69351 Hemiplegia and hemiparesis following cerebral infarction affecting right dominant side: Secondary | ICD-10-CM | POA: Diagnosis not present

## 2019-12-21 DIAGNOSIS — I69390 Apraxia following cerebral infarction: Secondary | ICD-10-CM

## 2019-12-21 DIAGNOSIS — E876 Hypokalemia: Secondary | ICD-10-CM

## 2019-12-21 DIAGNOSIS — I69398 Other sequelae of cerebral infarction: Secondary | ICD-10-CM

## 2019-12-21 DIAGNOSIS — E785 Hyperlipidemia, unspecified: Secondary | ICD-10-CM

## 2019-12-21 DIAGNOSIS — H53461 Homonymous bilateral field defects, right side: Secondary | ICD-10-CM | POA: Diagnosis not present

## 2019-12-21 DIAGNOSIS — N400 Enlarged prostate without lower urinary tract symptoms: Secondary | ICD-10-CM

## 2019-12-21 DIAGNOSIS — D696 Thrombocytopenia, unspecified: Secondary | ICD-10-CM | POA: Diagnosis not present

## 2019-12-21 DIAGNOSIS — R972 Elevated prostate specific antigen [PSA]: Secondary | ICD-10-CM

## 2019-12-21 DIAGNOSIS — Z96651 Presence of right artificial knee joint: Secondary | ICD-10-CM

## 2019-12-21 DIAGNOSIS — K2100 Gastro-esophageal reflux disease with esophagitis, without bleeding: Secondary | ICD-10-CM

## 2019-12-21 DIAGNOSIS — I119 Hypertensive heart disease without heart failure: Secondary | ICD-10-CM | POA: Diagnosis not present

## 2019-12-21 DIAGNOSIS — I69320 Aphasia following cerebral infarction: Secondary | ICD-10-CM | POA: Diagnosis not present

## 2019-12-21 DIAGNOSIS — Z7902 Long term (current) use of antithrombotics/antiplatelets: Secondary | ICD-10-CM

## 2019-12-21 DIAGNOSIS — Z87891 Personal history of nicotine dependence: Secondary | ICD-10-CM

## 2019-12-21 DIAGNOSIS — R001 Bradycardia, unspecified: Secondary | ICD-10-CM

## 2019-12-22 ENCOUNTER — Encounter: Payer: PPO | Attending: Physical Medicine and Rehabilitation | Admitting: Physical Medicine and Rehabilitation

## 2019-12-22 ENCOUNTER — Other Ambulatory Visit: Payer: Self-pay

## 2019-12-22 ENCOUNTER — Encounter: Payer: Self-pay | Admitting: Physical Medicine and Rehabilitation

## 2019-12-22 VITALS — BP 135/76 | HR 56 | Temp 98.7°F | Ht 70.0 in | Wt 190.0 lb

## 2019-12-22 DIAGNOSIS — H9193 Unspecified hearing loss, bilateral: Secondary | ICD-10-CM | POA: Diagnosis not present

## 2019-12-22 DIAGNOSIS — H53461 Homonymous bilateral field defects, right side: Secondary | ICD-10-CM | POA: Diagnosis not present

## 2019-12-22 DIAGNOSIS — I639 Cerebral infarction, unspecified: Secondary | ICD-10-CM | POA: Diagnosis not present

## 2019-12-22 DIAGNOSIS — I6381 Other cerebral infarction due to occlusion or stenosis of small artery: Secondary | ICD-10-CM

## 2019-12-22 NOTE — Progress Notes (Signed)
Subjective:    Patient ID: Philip Stone, male    DOB: February 15, 1950, 69 y.o.   MRN: [id number]  HPI  Philip Stone is a 69 year old man who presents for transitional care follow-up after left thalamic stroke.  He has been doing home therapy-  OT and PT. He thinks PT is doing well. He has been walking in his house multiples times per day. Arrives in wheelchair today.   He is accompanied by his friend today, who lives with him 24/7. They had been dating prior to his stroke.   He continues to have visual deficits which hinders his balance and ability to ambulate. Has not tried reading. Has not been to an ophthalmologist.   His friend bring in his medications. He has PCP f/u on Monday at which point he will get refills.   Has neurology f/u in December  He has been eating sandwiches, corn, green beans. Has had a good appetite. BMI 27.26  Was hard of heading prior to stroke (has hearing aide) but hearing has worsened since  Denies pain, constipation. Has been sleeping well at night.  Has no other concerns.    Pain Inventory Average Pain 1 Pain Right Now 0 My pain is n/a  LOCATION OF PAIN  n/a   BOWEL Number of stools per week: 7 Oral laxative use No  Type of laxative n/a Enema or suppository use No  History of colostomy No  Incontinent No   BLADDER Normal In and out cath, frequency n/a Able to self cath No  Bladder incontinence No  Frequent urination No  Leakage with coughing No  Difficulty starting stream No  Incomplete bladder emptying No    Mobility walk with assistance use a walker how many minutes can you walk? 2-3 ability to climb steps?  no do you drive?  no use a wheelchair Do you have any goals in this area?  yes  Function retired  Neuro/Psych numbness trouble walking  Prior Studies transitions of care  Physicians involved in your care transitions of care   Family History  Problem Relation Age of Onset  . Suicidality Mother   . Other Father         "old age"   Social History   Socioeconomic History  . Marital status: Single    Spouse name: Not on file  . Number of children: 0  . Years of education: 43  . Highest education level: High school graduate  Occupational History  . Occupation: Retired  Tobacco Use  . Smoking status: Former Smoker    Packs/day: 0.50    Years: 36.00    Pack years: 18.00    Types: Cigarettes    Start date: 04/10/1989    Quit date: 2008    Years since quitting: 13.8  . Smokeless tobacco: Never Used  Vaping Use  . Vaping Use: Never used  Substance and Sexual Activity  . Alcohol use: No    Alcohol/week: 0.0 standard drinks    Comment: none since 2005  . Drug use: No    Comment: none since 2000  . Sexual activity: Yes    Birth control/protection: None  Other Topics Concern  . Not on file  Social History Narrative   Lives at home alone.   Right-handed.   No caffeine use.   Social Determinants of Health   Financial Resource Strain: Low Risk   . Difficulty of Paying Living Expenses: Not hard at all  Food Insecurity: No Food Insecurity  .  Worried About Charity fundraiser in the Last Year: Never true  . Ran Out of Food in the Last Year: Never true  Transportation Needs: No Transportation Needs  . Lack of Transportation (Medical): No  . Lack of Transportation (Non-Medical): No  Physical Activity: Sufficiently Active  . Days of Exercise per Week: 7 days  . Minutes of Exercise per Session: 60 min  Stress: No Stress Concern Present  . Feeling of Stress : Not at all  Social Connections: Moderately Integrated  . Frequency of Communication with Friends and Family: More than three times a week  . Frequency of Social Gatherings with Friends and Family: Not on file  . Attends Religious Services: More than 4 times per year  . Active Member of Clubs or Organizations: Yes  . Attends Archivist Meetings: More than 4 times per year  . Marital Status: Never married   Past Surgical  History:  Procedure Laterality Date  . CERVICAL DISC SURGERY  10 YEARS AGO   reports there are plates and screws in place   . FOOT ARTHROPLASTY    . INGUINAL HERNIA REPAIR Right 06/15/2014   Procedure: RIGHT INGUINAL HERNIORRHAPHY  WITH MESH;  Surgeon: Aviva Signs Md, MD;  Location: [facility];  Service: General;  Laterality: Right;  . INSERTION OF MESH Right 06/15/2014   Procedure: INSERTION OF MESH RIGHT INGUINAL HERNIORRHAPHY;  Surgeon: Aviva Signs Md, MD;  Location: [facility];  Service: General;  Laterality: Right;  . KNEE ARTHROSCOPY Right   . TOTAL KNEE ARTHROPLASTY Right 01/21/2018   Procedure: TOTAL KNEE ARTHROPLASTY;  Surgeon: Sydnee Cabal, MD;  Location: [facility];  Service: Orthopedics;  Laterality: Right;   Past Medical History:  Diagnosis Date  . Arthritis   . BPH (benign prostatic hyperplasia)   . Charcot-Marie-Tooth disease   . Foot drop, right   . Neuropathy   . Platelets decreased ([place])   . Spinal stenosis    BP 135/76   Pulse (!) 56   Temp 98.7 F (37.1 C)   Ht 5\' 10"  (1.778 m)   Wt 190 lb (86.2 kg)   BMI 27.26 kg/m   Opioid Risk Score:   Fall Risk Score:  `1  Depression screen PHQ 2/9  Depression screen [facility] 2/9 08/09/2019 07/17/2019 04/25/2019 07/08/2018 10/20/2017 05/17/2017 05/04/2017  Decreased Interest 0 0 0 0 0 0 0  Down, Depressed, Hopeless 0 0 0 0 0 0 0  PHQ - 2 Score 0 0 0 0 0 0 0  Altered sleeping - - - 0 - - -  Tired, decreased energy - - - 0 - - -  Change in appetite - - - 0 - - -  Feeling bad or failure about yourself  - - - 0 - - -  Trouble concentrating - - - 0 - - -  Moving slowly or fidgety/restless - - - 0 - - -  Suicidal thoughts - - - 0 - - -  PHQ-9 Score - - - 0 - - -    Review of Systems  Constitutional: Negative.   HENT: Positive for hearing loss.   Eyes: Negative.   Respiratory: Negative.   Cardiovascular: Negative.   Gastrointestinal: Negative.   Endocrine: Negative.   Genitourinary: Negative.   Musculoskeletal: Positive for gait  problem.  Skin: Negative.   Allergic/Immunologic: Negative.   Neurological: Positive for numbness.  Hematological: Bruises/bleeds easily.  Psychiatric/Behavioral: Negative.   All other systems reviewed and are negative.  Objective:   Physical Exam Gen: no distress, normal appearing. In wheelchair, BMI 27.26 HEENT: oral mucosa pink and moist, NCAT. Hard of hearing Cardio: Reg rate Chest: normal effort, normal rate of breathing Abd: soft, non-distended Ext: no edema Skin: intact Neuro: Alert and oriented. Right sided hemianopsia.  Musculoskeletal: Left sided strength is 5/5, right sided strength is 4/5 SA, EF, 4-/5 WE, hand grip, 4-/5 HF, KE, 0/5 DF, PF (chronic) Psych: pleasant, normal affect     Assessment & Plan:  Mr. Mackins is a 69 year old man who presents for transitional care follow-up after L thalamic stroke:  1) Impaired mobility and ADLs -Continue home PT and OT, patient is progressing well -Made goal to walk outside with friend 10 minutes per day to maintain muscle mass, improve endurance, and balance.  2) Right sided hemianopsia: -Referred to neuro-opthalmology -Work on compensatory strategies with OT  3) Hard of hearing -Referred to audiology  4) Quality of life: -Denies constipation, pain, insomnia. In positive mood and has good support from friend. Eating well.   5) Left thalamic infarction -Has PCP follow-up on Monday, neurology follow-up in December, and currently requires no refills.   Transitional care f/u necessary given visual and auditory deficits that greatly impact therapy and quality of life. Appropriate referrals have been made. May follow-up as needed with our clinic. Provided out phone number should he have any further questions.

## 2019-12-25 ENCOUNTER — Other Ambulatory Visit: Payer: Self-pay

## 2019-12-25 ENCOUNTER — Ambulatory Visit (INDEPENDENT_AMBULATORY_CARE_PROVIDER_SITE_OTHER): Payer: PPO | Admitting: Nurse Practitioner

## 2019-12-25 ENCOUNTER — Encounter: Payer: Self-pay | Admitting: Nurse Practitioner

## 2019-12-25 VITALS — BP 139/73 | HR 56 | Temp 97.5°F | Resp 20 | Ht 70.0 in | Wt 179.0 lb

## 2019-12-25 DIAGNOSIS — I6930 Unspecified sequelae of cerebral infarction: Secondary | ICD-10-CM

## 2019-12-25 DIAGNOSIS — Z09 Encounter for follow-up examination after completed treatment for conditions other than malignant neoplasm: Secondary | ICD-10-CM | POA: Insufficient documentation

## 2019-12-25 DIAGNOSIS — I639 Cerebral infarction, unspecified: Secondary | ICD-10-CM

## 2019-12-25 MED ORDER — ATORVASTATIN CALCIUM 40 MG PO TABS: 40.0000 mg | ORAL_TABLET | Freq: Every day | ORAL | 0 refills | Status: DC

## 2019-12-25 MED ORDER — CLOPIDOGREL BISULFATE 75 MG PO TABS: 75.0000 mg | ORAL_TABLET | Freq: Every day | ORAL | 0 refills | Status: DC

## 2019-12-25 MED ORDER — ACETAMINOPHEN 500 MG PO TABS: 500.0000 mg | ORAL_TABLET | Freq: Four times a day (QID) | ORAL | 0 refills | Status: AC | PRN

## 2019-12-25 NOTE — Patient Instructions (Signed)

## 2019-12-25 NOTE — Assessment & Plan Note (Signed)
Patient is following up after hospital discharge.  Completed hospital discharge instructions.  Provided education to patient with printed handouts given.

## 2019-12-25 NOTE — Progress Notes (Signed)
Established Patient Office Visit  Subjective:  Patient ID: Philip Stone, male    DOB: May 28, 1950  Age: 69 y.o. MRN: [id number]  CC:  Chief Complaint  Patient presents with  . Hospitalization Follow-up    [facility] 10/8-10/29 - acute ischemic stroke    HPI Philip Stone is a 69 year old male who presents to clinic for follow-up hospital discharge after having CVA in the last 2 weeks.  Patient was admitted to [facility] with right-sided hemiparesis and numbness x3 days as well as aphasia.  Initial MRI/MRA showed no acute infarction hemorrhage or mass.  Moderate chronic microvascular ischemic changes noted.  Patient was followed up with neurology and treated, patient also completed rehab program and following up today with primary care.  Completed hospital discharge instructions.  With medication administration reconciliation completed.  Patient verbalized understanding.  Patient is reporting mild pain right side arms and legs.  Patient's face is symmetrical, slight weakness right arm and right leg. Patient has tried nothing for symptoms at this time.  Patient is reporting compliance with medication [Plavix, and Lipitor] administration from hospital and following up as directed.  Past Medical History:  Diagnosis Date  . Arthritis   . BPH (benign prostatic hyperplasia)   . Charcot-Marie-Tooth disease   . Foot drop, right   . Neuropathy   . Platelets decreased ([place])   . Spinal stenosis     Past Surgical History:  Procedure Laterality Date  . CERVICAL DISC SURGERY  10 YEARS AGO   reports there are plates and screws in place   . FOOT ARTHROPLASTY    . INGUINAL HERNIA REPAIR Right 06/15/2014   Procedure: RIGHT INGUINAL HERNIORRHAPHY  WITH MESH;  Surgeon: Aviva Signs Md, MD;  Location: [facility];  Service: General;  Laterality: Right;  . INSERTION OF MESH Right 06/15/2014   Procedure: INSERTION OF MESH RIGHT INGUINAL HERNIORRHAPHY;  Surgeon: Aviva Signs Md, MD;  Location: [facility];  Service:  General;  Laterality: Right;  . KNEE ARTHROSCOPY Right   . TOTAL KNEE ARTHROPLASTY Right 01/21/2018   Procedure: TOTAL KNEE ARTHROPLASTY;  Surgeon: Sydnee Cabal, MD;  Location: [facility];  Service: Orthopedics;  Laterality: Right;    Family History  Problem Relation Age of Onset  . Suicidality Mother   . Other Father        "old age"    Social History   Socioeconomic History  . Marital status: Single    Spouse name: Not on file  . Number of children: 0  . Years of education: 76  . Highest education level: High school graduate  Occupational History  . Occupation: Retired  Tobacco Use  . Smoking status: Former Smoker    Packs/day: 0.50    Years: 36.00    Pack years: 18.00    Types: Cigarettes    Start date: 04/10/1989    Quit date: 2008    Years since quitting: 13.8  . Smokeless tobacco: Never Used  Vaping Use  . Vaping Use: Never used  Substance and Sexual Activity  . Alcohol use: No    Alcohol/week: 0.0 standard drinks    Comment: none since 2005  . Drug use: No    Comment: none since 2000  . Sexual activity: Yes    Birth control/protection: None  Other Topics Concern  . Not on file  Social History Narrative   Lives at home alone.   Right-handed.   No caffeine use.   Social Determinants of Health   Financial Resource Strain:  Low Risk   . Difficulty of Paying Living Expenses: Not hard at all  Food Insecurity: No Food Insecurity  . Worried About Charity fundraiser in the Last Year: Never true  . Ran Out of Food in the Last Year: Never true  Transportation Needs: No Transportation Needs  . Lack of Transportation (Medical): No  . Lack of Transportation (Non-Medical): No  Physical Activity: Sufficiently Active  . Days of Exercise per Week: 7 days  . Minutes of Exercise per Session: 60 min  Stress: No Stress Concern Present  . Feeling of Stress : Not at all  Social Connections: Moderately Integrated  . Frequency of Communication with Friends and Family: More  than three times a week  . Frequency of Social Gatherings with Friends and Family: Not on file  . Attends Religious Services: More than 4 times per year  . Active Member of Clubs or Organizations: Yes  . Attends Archivist Meetings: More than 4 times per year  . Marital Status: Never married  Intimate Partner Violence: Not At Risk  . Fear of Current or Ex-Partner: No  . Emotionally Abused: No  . Physically Abused: No  . Sexually Abused: No    Outpatient Medications Prior to Visit  Medication Sig Dispense Refill  . atorvastatin (LIPITOR) 40 MG tablet Take 1 tablet (40 mg total) by mouth daily. 30 tablet 0  . clopidogrel (PLAVIX) 75 MG tablet Take 1 tablet (75 mg total) by mouth daily. 30 tablet 0  . pantoprazole (PROTONIX) 40 MG tablet Take 1 tablet (40 mg total) by mouth daily. 90 tablet 1  . tamsulosin (FLOMAX) 0.4 MG CAPS capsule Take 2 capsules (0.8 mg total) by mouth daily after supper. 30 capsule 1  . polyethylene glycol (MIRALAX / GLYCOLAX) 17 g packet Take 17 g by mouth 2 (two) times daily. (Patient not taking: Reported on 12/22/2019) 14 each 0   No facility-administered medications prior to visit.    Allergies  Allergen Reactions  . Asa [Aspirin] Anaphylaxis  . Prednisone     Makes him lightheaded    ROS Review of Systems  Musculoskeletal:       Right arm and right leg pain  Neurological: Positive for facial asymmetry, weakness and numbness. Negative for tremors, speech difficulty, light-headedness and headaches.       Slight facial asymmetry  Psychiatric/Behavioral: The patient is not nervous/anxious.   All other systems reviewed and are negative.     Objective:    Physical Exam Vitals reviewed. Exam conducted with a chaperone present.  HENT:     Head: Normocephalic.     Mouth/Throat:     Mouth: Mucous membranes are moist.     Pharynx: Oropharynx is clear.  Eyes:     Conjunctiva/sclera: Conjunctivae normal.  Cardiovascular:     Rate and Rhythm:  Normal rate and regular rhythm.     Pulses: Normal pulses.     Heart sounds: Normal heart sounds.  Pulmonary:     Effort: Pulmonary effort is normal.     Breath sounds: Normal breath sounds.  Abdominal:     General: Bowel sounds are normal.  Musculoskeletal:        General: Tenderness present.     Cervical back: No tenderness.  Skin:    General: Skin is warm.  Neurological:     Mental Status: He is alert and oriented to person, place, and time.     Motor: Weakness present.  Psychiatric:  Mood and Affect: Mood normal.        Behavior: Behavior normal.     BP 139/73   Pulse (!) 56   Temp (!) 97.5 F (36.4 C)   Resp 20   Ht 5\' 10"  (1.778 m)   Wt 179 lb (81.2 kg)   SpO2 99%   BMI 25.68 kg/m  Wt Readings from Last 3 Encounters:  12/25/19 179 lb (81.2 kg)  12/22/19 190 lb (86.2 kg)  12/02/19 180 lb 12.4 oz (82 kg)     Health Maintenance Due  Topic Date Due  . URINE MICROALBUMIN  Never done  . COVID-19 Vaccine (1) Never done  . COLONOSCOPY  04/11/2018  . INFLUENZA VACCINE  Never done    There are no preventive care reminders to display for this patient.  Lab Results  Component Value Date   TSH 1.635 11/16/2019   Lab Results  Component Value Date   WBC 5.5 12/04/2019   HGB 13.5 12/04/2019   HCT 40.5 12/04/2019   MCV 86.9 12/04/2019   PLT 163 12/04/2019   Lab Results  Component Value Date   NA 138 12/04/2019   K 4.3 12/04/2019   CO2 25 12/04/2019   GLUCOSE 105 (H) 12/04/2019   BUN 14 12/04/2019   CREATININE 1.08 12/08/2019   BILITOT 1.2 11/20/2019   ALKPHOS 82 11/20/2019   AST 32 11/20/2019   ALT 33 11/20/2019   PROT 6.7 11/20/2019   ALBUMIN 3.7 11/20/2019   CALCIUM 9.2 12/04/2019   ANIONGAP 8 12/04/2019   Lab Results  Component Value Date   CHOL 186 11/16/2019   Lab Results  Component Value Date   HDL 52 11/16/2019   Lab Results  Component Value Date   LDLCALC 125 (H) 11/16/2019   Lab Results  Component Value Date   TRIG 44  11/16/2019   Lab Results  Component Value Date   CHOLHDL 3.6 11/16/2019   Lab Results  Component Value Date   HGBA1C 5.6 11/16/2019      Assessment & Plan:   Problem List Items Addressed This Visit      Cardiovascular and Mediastinum   Acute CVA (cerebrovascular accident) ([place]) - Primary    No new signs and symptoms of CVA, patient is currently managed on Plavix and Lipitor.  Provided education to patient with printed handouts given.  Refill sent to pharmacy. Follow-up with worsening or unresolved symptoms.       Relevant Medications   atorvastatin (LIPITOR) 40 MG tablet   clopidogrel (PLAVIX) 75 MG tablet   acetaminophen (TYLENOL) 500 MG tablet     Other   Hospital discharge follow-up    Patient is following up after hospital discharge.  Completed hospital discharge instructions.  Provided education to patient with printed handouts given.         Meds ordered this encounter  Medications  . atorvastatin (LIPITOR) 40 MG tablet    Sig: Take 1 tablet (40 mg total) by mouth daily.    Dispense:  30 tablet    Refill:  0    Order Specific Question:   Supervising Provider    Answer:   Caryl Pina A [id number]  . clopidogrel (PLAVIX) 75 MG tablet    Sig: Take 1 tablet (75 mg total) by mouth daily.    Dispense:  30 tablet    Refill:  0    Order Specific Question:   Supervising Provider    Answer:   Caryl Pina A [id number]  . acetaminophen (  TYLENOL) 500 MG tablet    Sig: Take 1 tablet (500 mg total) by mouth every 6 (six) hours as needed.    Dispense:  60 tablet    Refill:  0    Order Specific Question:   Supervising Provider    Answer:   Caryl Pina A [[id number]]    Follow-up: Return if symptoms worsen or fail to improve.    Ivy Lynn, NP

## 2019-12-25 NOTE — Assessment & Plan Note (Signed)
No new signs and symptoms of CVA, patient is currently managed on Plavix and Lipitor.  Provided education to patient with printed handouts given.  Refill sent to pharmacy. Follow-up with worsening or unresolved symptoms.

## 2020-01-02 ENCOUNTER — Telehealth: Payer: Self-pay

## 2020-01-02 NOTE — Telephone Encounter (Signed)
A single reading in that range is not sufficient to consider medication, but if the upper number is above 140 most of the time, or the lower number over 90 most of the time. Medication would be appropriate. He must come to the office for evaluation for that to be done.

## 2020-01-02 NOTE — Telephone Encounter (Signed)
Patients wife aware and states that she will continue to check BPs at home and if stays elevated she will call back to make an appointment

## 2020-01-07 DIAGNOSIS — I639 Cerebral infarction, unspecified: Secondary | ICD-10-CM | POA: Diagnosis not present

## 2020-01-08 ENCOUNTER — Telehealth: Payer: Self-pay | Admitting: Family Medicine

## 2020-01-08 NOTE — Telephone Encounter (Signed)
REFERRAL REQUEST Telephone Note  Have you been seen at our office for this problem? Patient was referred to [facility], and they are stating he needs to be seen further by Rehab "in front of [place]" (Advise that they may need an appointment with their PCP before a referral can be done)  Reason for Referral:  Referral discussed with patient: Yes Best contact number of patient for referral team:    Has patient been seen by a specialist for this issue before: [facility] Patient provider preference for referral: Rehab Office in front of out building Patient location preference for referral: [place]   Patient notified that referrals can take up to a week or longer to process. If they haven't heard anything within a week they should call back and speak with the referral department.

## 2020-01-09 ENCOUNTER — Other Ambulatory Visit: Payer: Self-pay | Admitting: Nurse Practitioner

## 2020-01-09 DIAGNOSIS — I639 Cerebral infarction, unspecified: Secondary | ICD-10-CM

## 2020-01-09 DIAGNOSIS — Z09 Encounter for follow-up examination after completed treatment for conditions other than malignant neoplasm: Secondary | ICD-10-CM

## 2020-01-09 NOTE — Progress Notes (Unsigned)
physical

## 2020-01-09 NOTE — Telephone Encounter (Signed)
Patient was seen for hospital follow up on 12/25/2019 after suffering a CVA.  Home health had been out to see patient and recommended physical therapy be ordered at the facility in front of [place].  Could you order this for the patient please.

## 2020-01-09 NOTE — Telephone Encounter (Signed)
Referral completed, let me know if he needs anything else.

## 2020-01-09 NOTE — Telephone Encounter (Signed)
Pt. Needs to be seen for this. Thanks, WS 

## 2020-01-10 NOTE — Telephone Encounter (Signed)
Left detailed message per dpr  

## 2020-01-16 ENCOUNTER — Other Ambulatory Visit: Payer: Self-pay

## 2020-01-16 ENCOUNTER — Encounter: Payer: Self-pay | Admitting: Neurology

## 2020-01-16 ENCOUNTER — Ambulatory Visit: Payer: PPO | Admitting: Neurology

## 2020-01-16 ENCOUNTER — Encounter: Payer: PPO | Admitting: Family Medicine

## 2020-01-16 VITALS — BP 162/94 | HR 72

## 2020-01-16 DIAGNOSIS — N949 Unspecified condition associated with female genital organs and menstrual cycle: Secondary | ICD-10-CM | POA: Insufficient documentation

## 2020-01-16 DIAGNOSIS — R269 Unspecified abnormalities of gait and mobility: Secondary | ICD-10-CM | POA: Diagnosis not present

## 2020-01-16 DIAGNOSIS — I639 Cerebral infarction, unspecified: Secondary | ICD-10-CM | POA: Diagnosis not present

## 2020-01-16 DIAGNOSIS — G8191 Hemiplegia, unspecified affecting right dominant side: Secondary | ICD-10-CM

## 2020-01-16 MED ORDER — CLOPIDOGREL BISULFATE 75 MG PO TABS: 75.0000 mg | ORAL_TABLET | Freq: Every day | ORAL | 4 refills | Status: DC

## 2020-01-16 MED ORDER — LISINOPRIL 5 MG PO TABS: 5.0000 mg | ORAL_TABLET | Freq: Every day | ORAL | 11 refills | Status: DC
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
## 2020-01-17 ENCOUNTER — Encounter: Payer: Self-pay | Admitting: Physical Therapy

## 2020-01-17 ENCOUNTER — Other Ambulatory Visit: Payer: Self-pay

## 2020-01-17 ENCOUNTER — Ambulatory Visit: Payer: PPO | Attending: Nurse Practitioner | Admitting: Physical Therapy

## 2020-01-17 DIAGNOSIS — R2681 Unsteadiness on feet: Secondary | ICD-10-CM | POA: Insufficient documentation

## 2020-01-17 DIAGNOSIS — R2689 Other abnormalities of gait and mobility: Secondary | ICD-10-CM | POA: Insufficient documentation

## 2020-01-17 DIAGNOSIS — M6281 Muscle weakness (generalized): Secondary | ICD-10-CM | POA: Insufficient documentation

## 2020-01-17 NOTE — Therapy (Signed)
[facility] [place], [place], [address] Phone: [phone]   Fax:  [phone]  Physical Therapy Evaluation  Patient Details  Name: Philip Stone MRN: [id number] Date of Birth: 03-Feb-1951 Referring Provider (PT): Joni Reining, NP   Encounter Date: 01/17/2020   PT End of Session - 01/17/20 1509    Visit Number 1    Number of Visits 8    Date for PT Re-Evaluation 03/20/20    Authorization Type [employer] (CQ modifier); Progress note every 10th visit    PT Start Time 1345    PT Stop Time 1431    PT Time Calculation (min) 46 min    Equipment Utilized During Treatment --   rolling walker; bilateral AFO   Activity Tolerance Patient tolerated treatment well    Behavior During Therapy WFL for tasks assessed/performed           Past Medical History:  Diagnosis Date  . Arthritis   . BPH (benign prostatic hyperplasia)   . Charcot-Marie-Tooth disease   . Foot drop, right   . Hard of hearing   . Neuropathy   . Platelets decreased ([place])   . Spinal stenosis   . Stroke [facility])     Past Surgical History:  Procedure Laterality Date  . CERVICAL DISC SURGERY  10 YEARS AGO   reports there are plates and screws in place   . FOOT ARTHROPLASTY    . INGUINAL HERNIA REPAIR Right 06/15/2014   Procedure: RIGHT INGUINAL HERNIORRHAPHY  WITH MESH;  Surgeon: Aviva Signs Md, MD;  Location: [facility];  Service: General;  Laterality: Right;  . INSERTION OF MESH Right 06/15/2014   Procedure: INSERTION OF MESH RIGHT INGUINAL HERNIORRHAPHY;  Surgeon: Aviva Signs Md, MD;  Location: [facility];  Service: General;  Laterality: Right;  . KNEE ARTHROSCOPY Right   . TOTAL KNEE ARTHROPLASTY Right 01/21/2018   Procedure: TOTAL KNEE ARTHROPLASTY;  Surgeon: Sydnee Cabal, MD;  Location: [facility];  Service: Orthopedics;  Laterality: Right;    There were no vitals filed for this visit.    Subjective Assessment - 01/17/20 1451    Subjective COVID-19  screening performed upon arrival. Patient arrives to physical therapy with reports of right sided weakness, difficulty walking, and difficulty performing ADLs secondary to a CVA on 11/15/2019. Patient gains assistance from family friend/significant other for ADLs such as lower body dressing and towel drying after the shower. She also supervises patient with ambulation with rolling walker. Patient reports having skilled home health PT with good results. Patient is compliant with HEP provided by PT. Patient's goals are to improve movement, improve strength, improve standing and walking tolerance, and improve ability to perform ADLs.    Patient is accompained by: Family member   [name]   Pertinent History CVA 11/15/2019 R hemiparesis, R visual field impairment, HTN, Charcot Marie Tooth Disease, R foot drop, neuropathy, R TKA    Limitations Standing;Walking;House hold activities    Patient Stated Goals get more motion and use exercise equipment in home    Currently in Pain? No/denies              [facility] PT Assessment - 01/17/20 0001      Assessment   Medical Diagnosis Acute CVA    Referring Provider (PT) Joni Reining, NP    Onset Date/Surgical Date 11/15/19    Hand Dominance Right    Next MD Visit to be made    Prior Therapy home health and inpatient  Precautions   Precautions Fall    Precaution Comments supervised gait; gait belt      Restrictions   Weight Bearing Restrictions No      Balance Screen   Has the patient fallen in the past 6 months No    Has the patient had a decrease in activity level because of a fear of falling?  Yes    Is the patient reluctant to leave their home because of a fear of falling?  No      Home Environment   Living Environment Private residence    Living Arrangements Spouse/significant other    Type of [place] entrance      Prior Function   Level of Independence Needs assistance with ADLs;Needs assistance with  homemaking;Independent with household mobility with device      Coordination   Gross Motor Movements are Fluid and Coordinated No    Finger Nose Finger Test poor coordination of right UE    Heel Shin Test poor coordination of R LE      ROM / Strength   AROM / PROM / Strength Strength      Strength   Overall Strength Deficits    Strength Assessment Site Shoulder;Hand;Hip;Knee    Right/Left Shoulder Right;Left    Right Shoulder Flexion 3-/5    Right Shoulder ABduction 3-/5    Right Shoulder Internal Rotation 3+/5    Right Shoulder External Rotation 3+/5    Left Shoulder Flexion 4/5    Left Shoulder ABduction 4/5    Left Shoulder Internal Rotation 4+/5    Left Shoulder External Rotation 4+/5    Right/Left Hip Right;Left    Right Hip Flexion 3+/5    Right Hip Extension 3+/5    Left Hip Flexion 4+/5    Left Hip Extension 4-/5    Left Hip ABduction 3+/5    Right/Left Knee Right;Left    Right Knee Flexion 3+/5    Right Knee Extension 3+/5    Left Knee Flexion 4+/5    Left Knee Extension 5/5      Palpation   Palpation comment R sided numbness      Transfers   Five time sit to stand comments  43.83 seconds with UEs     Comments supervision with transfers from sit <> stand; supervision for bed mobility and sidelying <>sit.      Ambulation/Gait   Assistive device Rolling walker    Gait Pattern Step-through pattern;Decreased stride length;Decreased stance time - right;Narrow base of support;Trunk flexed;Poor foot clearance - right;Poor foot clearance - left;Right steppage;Decreased weight shift to right    Ambulation Surface Level;Indoor    Gait Comments requires min A for guidance and prevent listing to either side      Balance   Balance Assessed Yes      Static Standing Balance   Static Standing - Balance Support No upper extremity supported    Static Standing - Level of Assistance 5: Stand by assistance    Static Standing Balance -  Activities  Romberg - Eyes  Opened;Romberg - Eyes Closed    Static Standing - Comment/# of Minutes 30 seconds romberg; 5 seconds eyes closed                      Objective measurements completed on examination: See above findings.               PT Education - 01/17/20 1508  Education Details sit to stand, clam shell, pillow squeeze with supervision    Person(s) Educated Patient;Spouse    Methods Explanation;Demonstration;Handout    Comprehension Verbalized understanding               PT Long Term Goals - 01/17/20 1517      PT LONG TERM GOAL #1   Title Patient will be modified independent with HEP and its progression with assistance from family.    Time 8    Period Weeks    Status New      PT LONG TERM GOAL #2   Title Patient will decrease risk of falls as noted by the ability to perform modified 5x sit to stand with UE support in 30 seconds or less.    Time 4    Period Weeks    Status New      PT LONG TERM GOAL #3   Title Patient will ambulate 200 feet with rolling walker or LRAD with supervision to safely ambulate around community.    Time 4    Period Weeks    Status New      PT LONG TERM GOAL #4   Title Patient will report ability to perform ADLs and home activities with LRAD and modified independence to improve independence.    Time 8    Period Weeks    Status New                  Plan - 01/17/20 1511    Clinical Impression Statement Patient is a 70 year old male who presents to physical therapy with right sided weakness, difficulty walking, and decreased balance that began after a CVA on 11/15/2019. Patient's modified 5x sit to stand with UE support time of 43 seconds categorizes him as a fall risk. Patient is able to perform transfers and bed mobility with supervision. Patient required PT guidance down hallway during ambulation to prevent listing to either side. Patient and PT discussed plan of care and discussed HEP. Patient only able to attend PT 1x per  week. Patient educated importance of performing HEP to maximize PT benefit  to which patient reported understanding. Patient would benefit from skilled physical therapy to address deficits and address patient's goals.    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, R visual field impairment, HTN, Charcot Marie Tooth Disease, R foot drop, neuropathy, R TKA    Examination-Activity Limitations Locomotion Level;Dressing;Stand;Transfers;Hygiene/Grooming    Stability/Clinical Decision Making Evolving/Moderate complexity    Clinical Decision Making Moderate    Rehab Potential Fair    PT Frequency 1x / week    PT Duration 8 weeks    PT Treatment/Interventions ADLs/Self Care Home Management;Gait training;Stair training;Functional mobility training;Therapeutic activities;Therapeutic exercise;Balance training;Manual techniques;Neuromuscular re-education;Passive range of motion;Patient/family education    PT Next Visit Plan nustep, LE and UE strengthening and coordination, balance activities in sitting and standing    PT Home Exercise Plan see patient education section    Consulted and Agree with Plan of Care Patient           Patient will benefit from skilled therapeutic intervention in order to improve the following deficits and impairments:  Abnormal gait, Decreased activity tolerance, Decreased balance, Decreased strength, Difficulty walking, Postural dysfunction, Impaired vision/preception, Impaired UE functional use  Visit Diagnosis: Muscle weakness (generalized) - Plan: PT plan of care cert/re-cert  Other abnormalities of gait and mobility - Plan: PT plan of care cert/re-cert  Unsteadiness on  feet - Plan: PT plan of care cert/re-cert     Problem List Patient Active Problem List   Diagnosis Date Noted  . Gait abnormality 01/16/2020  . CMT (cervical motion tenderness) 01/16/2020  . Hospital discharge follow-up 12/25/2019  .  Bradycardia   . Slow transit constipation   . Right hemiparesis ([place])   . Apraxia   . 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 Cerebrovascular accident (CVA) ([place])   . Elevated blood pressure reading   . Benign prostatic hyperplasia   . Polyneuropathy associated with underlying disease ([place])   . Thrombocytopenia ([place]) 11/16/2019  . Hypokalemia 11/16/2019  . Acute ischemic stroke ([place]) 11/16/2019  . Acute CVA (cerebrovascular accident) ([place]) 11/15/2019  . Right facial numbness 11/14/2019  . Blurry vision, right eye 11/14/2019  . 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 Cervical stenosis of spinal canal 11/16/2017  . 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 Foot drop, right 07/15/2017  . Gastroesophageal reflux disease with esophagitis 11/30/2016  . Benign prostatic hyperplasia with elevated prostate specific antigen (PSA) 11/30/2016    Gabriela Eves, PT, DPT 01/17/2020, 3:35 PM  [facility] [address] [place], [place], [address] Phone: [phone]   Fax:  [phone]  Name: Zaidyn Claire MRN: [id number] Date of Birth: January 06, 1951

## 2020-01-19 ENCOUNTER — Emergency Department (HOSPITAL_COMMUNITY)
Admission: EM | Admit: 2020-01-19 | Discharge: 2020-01-19 | Disposition: A | Discharge status: Home / Self Care | Payer: PPO | Attending: Emergency Medicine | Admitting: Emergency Medicine

## 2020-01-19 ENCOUNTER — Emergency Department (HOSPITAL_COMMUNITY): Payer: PPO

## 2020-01-19 ENCOUNTER — Telehealth: Payer: Self-pay

## 2020-01-19 ENCOUNTER — Encounter (HOSPITAL_COMMUNITY): Payer: Self-pay | Admitting: Emergency Medicine

## 2020-01-19 ENCOUNTER — Other Ambulatory Visit: Payer: Self-pay

## 2020-01-19 DIAGNOSIS — Z79899 Other long term (current) drug therapy: Secondary | ICD-10-CM | POA: Diagnosis not present

## 2020-01-19 DIAGNOSIS — R42 Dizziness and giddiness: Secondary | ICD-10-CM | POA: Insufficient documentation

## 2020-01-19 DIAGNOSIS — Z8673 Personal history of transient ischemic attack (TIA), and cerebral infarction without residual deficits: Secondary | ICD-10-CM | POA: Diagnosis not present

## 2020-01-19 DIAGNOSIS — Z7902 Long term (current) use of antithrombotics/antiplatelets: Secondary | ICD-10-CM | POA: Insufficient documentation

## 2020-01-19 DIAGNOSIS — H538 Other visual disturbances: Secondary | ICD-10-CM | POA: Diagnosis not present

## 2020-01-19 DIAGNOSIS — I63532 Cerebral infarction due to unspecified occlusion or stenosis of left posterior cerebral artery: Secondary | ICD-10-CM | POA: Insufficient documentation

## 2020-01-19 DIAGNOSIS — I6523 Occlusion and stenosis of bilateral carotid arteries: Secondary | ICD-10-CM | POA: Diagnosis not present

## 2020-01-19 DIAGNOSIS — Z87891 Personal history of nicotine dependence: Secondary | ICD-10-CM | POA: Diagnosis not present

## 2020-01-19 DIAGNOSIS — Z96651 Presence of right artificial knee joint: Secondary | ICD-10-CM | POA: Insufficient documentation

## 2020-01-19 DIAGNOSIS — I6602 Occlusion and stenosis of left middle cerebral artery: Secondary | ICD-10-CM | POA: Diagnosis not present

## 2020-01-19 DIAGNOSIS — I639 Cerebral infarction, unspecified: Secondary | ICD-10-CM | POA: Diagnosis not present

## 2020-01-19 LAB — CBC
HCT: 40.4 % (ref 39.0–52.0)
Hemoglobin: 14 g/dL (ref 13.0–17.0)
MCH: 30.2 pg (ref 26.0–34.0)
MCHC: 34.7 g/dL (ref 30.0–36.0)
MCV: 87.1 fL (ref 80.0–100.0)
Platelets: 108 10*3/uL — ABNORMAL LOW (ref 150–400)
RBC: 4.64 MIL/uL (ref 4.22–5.81)
RDW: 13.5 % (ref 11.5–15.5)
WBC: 4.5 10*3/uL (ref 4.0–10.5)
nRBC: 0 % (ref 0.0–0.2)

## 2020-01-19 LAB — BASIC METABOLIC PANEL
Anion gap: 10 (ref 5–15)
BUN: 15 mg/dL (ref 8–23)
CO2: 26 mmol/L (ref 22–32)
Calcium: 9.4 mg/dL (ref 8.9–10.3)
Chloride: 103 mmol/L (ref 98–111)
Creatinine, Ser: 1 mg/dL (ref 0.61–1.24)
GFR, Estimated: >60 mL/min (ref >60)
Glucose, Bld: 98 mg/dL (ref 70–99)
Potassium: 4.5 mmol/L (ref 3.5–5.1)
Sodium: 139 mmol/L (ref 135–145)

## 2020-01-19 LAB — URINALYSIS, ROUTINE W REFLEX MICROSCOPIC
Bilirubin Urine: NEGATIVE
Glucose, UA: NEGATIVE mg/dL
Hgb urine dipstick: NEGATIVE
Ketones, ur: NEGATIVE mg/dL
Leukocytes,Ua: NEGATIVE
Nitrite: NEGATIVE
Protein, ur: NEGATIVE mg/dL
Specific Gravity, Urine: 1.011 (ref 1.005–1.030)
pH: 5 (ref 5.0–8.0)

## 2020-01-19 LAB — TROPONIN I (HIGH SENSITIVITY): Troponin I (High Sensitivity): 8 ng/L (ref <18)

## 2020-01-19 LAB — PROTIME-INR
INR: 1.1 (ref 0.8–1.2)
Prothrombin Time: 14.1 seconds (ref 11.4–15.2)

## 2020-01-19 LAB — CBG MONITORING, ED: Glucose-Capillary: 101 mg/dL — ABNORMAL HIGH (ref 70–99)

## 2020-01-19 MED ORDER — SODIUM CHLORIDE 0.9 % IV BOLUS: 500.0000 mL | Freq: Once | INTRAVENOUS | Status: DC

## 2020-01-19 MED ORDER — IOHEXOL 350 MG/ML SOLN
100.0000 mL | Freq: Once | INTRAVENOUS | Status: AC | PRN
  Administered 2020-01-19: 100 mL via INTRAVENOUS

## 2020-01-19 NOTE — Telephone Encounter (Signed)
Patient aware and voices understanding

## 2020-01-19 NOTE — ED Notes (Signed)
Pt and wife verbalize understanding of d/c instructions, follow and medication changes. Pt assisted into WC by staff and assisted into vehicle without incident. A & O, NAD

## 2020-01-19 NOTE — ED Triage Notes (Addendum)
Pt arrives to ED with c/o dizziness and lightheadedness since yesterday 12/9. Pt states hes been pale and not feeling well. Denies CP, fevers, and SOB. Hx stroke with right sided deficits.

## 2020-01-19 NOTE — Telephone Encounter (Signed)
Returned call - Patient was placed on 5mg  lisinopril on Wednesday from the Neurologist.  States he only took one dose of it because it started to make him feel dizzy and light headed.  States he is more dizzy with walking. Patient is currently still dizzy and light headed this morning. BP this morning was 126/66.  States he has no other symptoms.  Please advise

## 2020-01-19 NOTE — Telephone Encounter (Signed)
For now just rest at home.  Hold off on the lisinopril through the weekend.  Try starting it again at 1/2 pill daily on Monday.

## 2020-01-19 NOTE — ED Notes (Signed)
Called lab to have troponin added to previous blood collection

## 2020-01-19 NOTE — ED Provider Notes (Signed)
[place] EMERGENCY DEPARTMENT Provider Note   CSN: [id number] Arrival date & time: 01/19/20  1245     History Chief Complaint  Patient presents with  . Dizziness    Philip Stone is a 68 y.o. male.  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  Patient endorses that started on Wednesday and had increasing gotten worse.  He endorses when he stands up in the morning he becomes  off balance and feels dizzy and lightheaded.  He says sitting back down does not make it better and will stay with him throughout the day.  He denies headaches, change in vision, nausea, vomiting, paresthesias or weakness in the upper or lower extremities.  He walks at home with a walker and his wife noted that he was unable to walk today as he was very off balance.  He denies hitting his head, losing conscious, is not on anticoagulants.  He does endorse that he recently started lisinopril on Wednesday and since then he has increased dizziness.  Patient endorses  that he feels like he did before his stroke.  He denies associated fevers, chills, chest pain, shortness of breath, abdominal pain, nausea, vomiting, diarrhea.  Patient denies any alleviating factors at this time.    Past Medical History:  Diagnosis Date  . Arthritis   . BPH (benign prostatic hyperplasia)   . Charcot-Marie-Tooth disease   . Foot drop, right   . Hard of hearing   . Neuropathy   . Platelets decreased ([place])   . Spinal stenosis   . Stroke [facility])     Patient Active Problem List   Diagnosis Date Noted  . Gait abnormality 01/16/2020  . CMT (cervical motion tenderness) 01/16/2020  . Hospital discharge follow-up 12/25/2019  . Bradycardia   . Slow transit constipation   . Right hemiparesis  ([place])   . Apraxia   . Left thalamic infarction ([place]) 11/17/2019  . Cerebrovascular accident (CVA) ([place])   . Elevated blood pressure reading   . Benign prostatic hyperplasia   . Polyneuropathy associated with underlying disease ([place])   . Thrombocytopenia ([place]) 11/16/2019  . Hypokalemia 11/16/2019  . Acute ischemic stroke ([place]) 11/16/2019  . Acute CVA (cerebrovascular accident) ([place]) 11/15/2019  . Right facial numbness 11/14/2019  . Blurry vision, right eye 11/14/2019  . 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 Cervical stenosis of spinal canal 11/16/2017  . 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 Foot drop, right 07/15/2017  . Gastroesophageal reflux disease with esophagitis 11/30/2016  . Benign prostatic hyperplasia with elevated prostate specific antigen (PSA) 11/30/2016    Past Surgical History:  Procedure Laterality Date  . CERVICAL DISC SURGERY  10 YEARS AGO   reports there are plates and screws in place   . FOOT ARTHROPLASTY    . INGUINAL HERNIA REPAIR Right 06/15/2014   Procedure: RIGHT INGUINAL HERNIORRHAPHY  WITH MESH;  Surgeon: Aviva Signs Md, MD;  Location: [facility];  Service: General;  Laterality: Right;  . INSERTION OF MESH Right 06/15/2014   Procedure: INSERTION OF MESH RIGHT INGUINAL HERNIORRHAPHY;  Surgeon: Aviva Signs Md, MD;  Location: [facility] OR[facility];  Service: General;  Laterality: Right;  . KNEE ARTHROSCOPY Right   . TOTAL KNEE ARTHROPLASTY Right 01/21/2018   Procedure: TOTAL KNEE ARTHROPLASTY;  Surgeon: Sydnee Cabal, MD;  Location: [facility];  Service: Orthopedics;  Laterality: Right;       Family History  Problem Relation Age of Onset  . Suicidality Mother   . Other Father        "old age"    Social History   Tobacco Use  . Smoking status: Former Smoker    Packs/day: 0.50    Years: 36.00    Pack years: 18.00    Types: Cigarettes    Start date: 04/10/1989    Quit date: 2008    Years since quitting: 13.9  . Smokeless tobacco: Never Used  Vaping Use  .  Vaping Use: Never used  Substance Use Topics  . Alcohol use: No    Alcohol/week: 0.0 standard drinks    Comment: none since 2005  . Drug use: No    Comment: none since 2000    Home Medications Prior to Admission medications   Medication Sig Start Date End Date Taking? Authorizing Provider  acetaminophen (TYLENOL) 500 MG tablet Take 1 tablet (500 mg total) by mouth every 6 (six) hours as needed. Patient taking differently: Take 500 mg by mouth every 6 (six) hours as needed for mild pain. 12/25/19  Yes Ivy Lynn, NP  atorvastatin (LIPITOR) 40 MG tablet Take 1 tablet (40 mg total) by mouth daily. 12/25/19  Yes Ivy Lynn, NP  clopidogrel (PLAVIX) 75 MG tablet Take 1 tablet (75 mg total) by mouth daily. 01/16/20  Yes Marcial Pacas, MD  lisinopril (ZESTRIL) 5 MG tablet Take 1 tablet (5 mg total) by mouth daily. 01/16/20  Yes Marcial Pacas, MD  pantoprazole (PROTONIX) 40 MG tablet Take 1 tablet (40 mg total) by mouth daily. 12/07/19  Yes Angiulli, Lavon Paganini, PA-C  tamsulosin (FLOMAX) 0.4 MG CAPS capsule Take 2 capsules (0.8 mg total) by mouth daily after supper. 12/07/19  Yes Angiulli, Lavon Paganini, PA-C    Allergies    Asa [aspirin] and Prednisone  Review of Systems   Review of Systems  Constitutional: Negative for chills and fever.  HENT: Negative for congestion and trouble swallowing.   Eyes: Negative for photophobia and visual disturbance.  Respiratory: Negative for cough and shortness of breath.   Cardiovascular: Negative for chest pain.  Gastrointestinal: Negative for abdominal pain, diarrhea, nausea and vomiting.  Genitourinary: Negative for dysuria, enuresis and flank pain.  Musculoskeletal: Negative for back pain.  Skin: Negative for rash.  Neurological: Positive for dizziness and light-headedness. Negative for headaches.  Hematological: Does not bruise/bleed easily.    Physical Exam Updated Vital Signs BP (!) 141/106   Pulse 74   Temp 98.5 F (36.9 C)   Resp 19   Ht  5\' 10"  (1.778 m)   Wt 77.1 kg   SpO2 99%   BMI 24.39 kg/m   Physical Exam Vitals and nursing note reviewed.  Constitutional:      General: He is not in acute distress.    Appearance: Normal appearance. He is not ill-appearing or diaphoretic.  HENT:     Head: Normocephalic and atraumatic.     Nose: No congestion or rhinorrhea.  Eyes:     General: Visual field deficit present. No scleral icterus.    Extraocular Movements: Extraocular movements intact.     Conjunctiva/sclera: Conjunctivae normal.     Pupils: Pupils are equal, round, and reactive to light.  Cardiovascular:     Rate and Rhythm: Normal rate and regular rhythm.     Pulses: Normal pulses.     Heart sounds: No murmur heard. No friction rub. No gallop.  Pulmonary:     Effort: Pulmonary effort is normal. No respiratory distress.     Breath sounds: No wheezing, rhonchi or rales.  Abdominal:     Palpations: Abdomen is soft.     Tenderness: There is no guarding.  Musculoskeletal:        General: No swelling or tenderness.     Right lower leg: No edema.     Left lower leg: No edema.  Skin:    General: Skin is warm and dry.  Neurological:     General: No focal deficit present.     Mental Status: He is alert.     GCS: GCS eye subscore is 4. GCS verbal subscore is 5. GCS motor subscore is 6.     Cranial Nerves: No cranial nerve deficit or facial asymmetry.     Sensory: Sensation is intact. No sensory deficit.     Motor: Weakness present. No pronator drift.     Coordination: Coordination is intact. Romberg sign negative. Finger-Nose-Finger Test normal.     Comments: Patient has noted decrease in visual field bilaterally.  Unable to make out number of fingers on his peripheries.  He is also noted to have slight decrease in strength 4/5 with his right hip flexor.    Psychiatric:        Mood and Affect: Mood normal.     ED Results / Procedures / Treatments   Labs (all labs ordered are listed, but only abnormal  results are displayed) Labs Reviewed  CBC - Abnormal; Notable for the following components:      Result Value   Platelets 108 (*)    All other components within normal limits  CBG MONITORING, ED - Abnormal; Notable for the following components:   Glucose-Capillary 101 (*)    All other components within normal limits  RESP PANEL BY RT-PCR (FLU A&B, COVID) ARPGX2  BASIC METABOLIC PANEL  URINALYSIS, ROUTINE W REFLEX MICROSCOPIC  PROTIME-INR  TROPONIN I (HIGH SENSITIVITY)    EKG EKG Interpretation  Date/Time:  Friday January 19 2020 12:52:09 EST Ventricular Rate:  62 PR Interval:  136 QRS Duration: 94 QT Interval:  394 QTC Calculation: 399 R Axis:   -12 Text Interpretation: Normal sinus rhythm T wave abnormality Artifact Abnormal ECG Confirmed by Carmin Muskrat [phone]) on 01/19/2020 2:04:27 PM   Radiology CT Angio Head W or Wo Contrast  Result Date: 01/19/2020 CLINICAL DATA:  69 year old male with persistent, recurrent dizziness. Onset of symptoms yesterday. Mild acute on large previous left PCA territory infarct on MRI today. Left PCA occlusion in October. EXAM: CT ANGIOGRAPHY HEAD AND NECK TECHNIQUE: Multidetector CT imaging of the head and neck was performed using the standard protocol during bolus administration of intravenous contrast. Multiplanar CT image reconstructions and MIPs were obtained to evaluate the vascular anatomy. Carotid stenosis measurements (when applicable) are obtained utilizing NASCET criteria, using the distal internal carotid diameter as the denominator. CONTRAST:  155mL OMNIPAQUE IOHEXOL 350 MG/ML SOLN COMPARISON:  Brain MRI 1722 hours today. MRI head, MRA head and neck 11/15/2019. FINDINGS: CTA NECK Skeleton: Previous cervical spine fusion, posterior laminar hardware. Levels of ankylosis in both the cervical and visible upper thoracic spine. No acute osseous abnormality identified. Upper chest: Negative. Other neck: Negative. Aortic arch: 3 vessel arch  configuration. Mild arch atherosclerosis. Right carotid system: Mild brachiocephalic artery plaque without stenosis. Negative right CCA. Minimal plaque at the right ICA origin without stenosis. Left carotid system: Mild soft plaque in the medial left CCA at the  level of the larynx without stenosis. Similar mild soft plaque at the left carotid bifurcation and ICA origin. No stenosis. Vertebral arteries: Mild plaque in the proximal right subclavian artery without stenosis. Normal right vertebral artery origin. Highly diminutive but patent right vertebral artery to the skull base. Mild plaque in the proximal left subclavian artery without stenosis. Normal left vertebral artery origin. Tortuous left V1 segment. Patent left vertebral to the skull base without stenosis. CTA HEAD Posterior circulation: Left vertebral artery primarily supplies the basilar without stenosis in the V4 segment. Normal left PICA origin. Right PICA origin is normal. Diminutive right vertebral continues to the vertebrobasilar junction. Patent basilar artery with mild irregularity similar to the October MRA (series 12, image 24). Patent SCA and right PCA origins. Patent left posterior communicating artery but otherwise occluded left PCA (series 13, image 25). Contralateral right PCA remarkable for moderate to severe stenosis in the P1 segment (series 12, image 24) which is new since October. Right PCA branches have not significantly changed. Anterior circulation: Both ICA siphons are patent. Mild left siphon calcified plaque without stenosis. Moderate right siphon calcified plaque, with mild to moderate proximal cavernous segment stenosis. Normal left posterior communicating artery origin. Patent carotid termini. Patent MCA and ACA origins with dominant right ACA A1 segment, stable. Anterior communicating artery and bilateral ACA branches are within normal limits. Left MCA M1 segment, bifurcation, and left MCA branches are stable and within normal  limits. Right MCA M1 segment, bifurcation, right MCA branches are stable and within normal limits. Venous sinuses: Early contrast timing. Patent superior sagittal sinus. Anatomic variants: Dominant left vertebral artery which primarily supplies the basilar. Dominant right ACA A1. Review of the MIP images confirms the above findings IMPRESSION: 1. Continued occlusion of the Left PCA, unchanged since October. 2. Positive for new moderate to severe Right PCA P1 segment stenosis since October. Right PCA branches remain within normal limits. 3. No other posterior circulation stenosis. Mild carotid atherosclerosis with no stenosis in the neck. Up to moderate right ICA siphon stenosis in the cavernous segment. No other anterior circulation stenosis. Electronically Signed   By: Genevie Ann M.D.   On: 01/19/2020 18:59   CT Head Wo Contrast  Result Date: 01/19/2020 CLINICAL DATA:  Vision loss, monocular. Additional history provided: Patient reports dizziness and lightheadedness since yesterday, history of stroke with right-sided deficits. EXAM: CT HEAD WITHOUT CONTRAST TECHNIQUE: Contiguous axial images were obtained from the base of the skull through the vertex without intravenous contrast. COMPARISON:  MRI/MRA head 11/15/2019. FINDINGS: Brain: Mild generalized cerebral atrophy. Redemonstrated large chronic left PCA territory cortical/subcortical infarct within the left temporal and occipital lobes. Mild/moderate ill-defined hypoattenuation within the cerebral white matter is nonspecific, but compatible with chronic small vessel ischemic disease. Unchanged chronic right caudothalamic lacunar infarct. 11 mm partially calcified focus along the mid falx, suspicious for incidental small meningioma. There is no acute intracranial hemorrhage. No acute demarcated cortical infarct. No extra-axial fluid collection. No midline shift. Vascular: No hyperdense vessel.  Atherosclerotic calcifications. Skull: Normal. Negative for fracture  or focal lesion. Sinuses/Orbits: Visualized orbits show no acute finding. Nonspecific posterior globe calcifications bilaterally. Mild ethmoid and left frontal sinus mucosal thickening. Small volume frothy secretions are also present within the left frontal sinus. IMPRESSION: 1. No evidence of acute intracranial abnormality. 2. Redemonstrated large chronic left PCA territory cortical/subcortical infarct. 3. Mild cerebral atrophy with mild-to-moderate cerebral white matter chronic small vessel ischemic disease. 4. Unchanged chronic right caudothalamic lacunar infarct. 5. 11 mm partially  calcified focus along the mid falx, suspicious for small incidental meningioma. Nonemergent contrast-enhanced brain MRI recommended for further evaluation. 6. Nonspecific posterior globe calcifications bilaterally. Nonemergent funduscopic examination recommended. 7. Ethmoid and left frontal sinusitis. Electronically Signed   By: Kellie Simmering DO   On: 01/19/2020 15:07   CT Angio Neck W and/or Wo Contrast  Result Date: 01/19/2020 CLINICAL DATA:  69 year old male with persistent, recurrent dizziness. Onset of symptoms yesterday. Mild acute on large previous left PCA territory infarct on MRI today. Left PCA occlusion in October. EXAM: CT ANGIOGRAPHY HEAD AND NECK TECHNIQUE: Multidetector CT imaging of the head and neck was performed using the standard protocol during bolus administration of intravenous contrast. Multiplanar CT image reconstructions and MIPs were obtained to evaluate the vascular anatomy. Carotid stenosis measurements (when applicable) are obtained utilizing NASCET criteria, using the distal internal carotid diameter as the denominator. CONTRAST:  111mL OMNIPAQUE IOHEXOL 350 MG/ML SOLN COMPARISON:  Brain MRI 1722 hours today. MRI head, MRA head and neck 11/15/2019. FINDINGS: CTA NECK Skeleton: Previous cervical spine fusion, posterior laminar hardware. Levels of ankylosis in both the cervical and visible upper  thoracic spine. No acute osseous abnormality identified. Upper chest: Negative. Other neck: Negative. Aortic arch: 3 vessel arch configuration. Mild arch atherosclerosis. Right carotid system: Mild brachiocephalic artery plaque without stenosis. Negative right CCA. Minimal plaque at the right ICA origin without stenosis. Left carotid system: Mild soft plaque in the medial left CCA at the level of the larynx without stenosis. Similar mild soft plaque at the left carotid bifurcation and ICA origin. No stenosis. Vertebral arteries: Mild plaque in the proximal right subclavian artery without stenosis. Normal right vertebral artery origin. Highly diminutive but patent right vertebral artery to the skull base. Mild plaque in the proximal left subclavian artery without stenosis. Normal left vertebral artery origin. Tortuous left V1 segment. Patent left vertebral to the skull base without stenosis. CTA HEAD Posterior circulation: Left vertebral artery primarily supplies the basilar without stenosis in the V4 segment. Normal left PICA origin. Right PICA origin is normal. Diminutive right vertebral continues to the vertebrobasilar junction. Patent basilar artery with mild irregularity similar to the October MRA (series 12, image 24). Patent SCA and right PCA origins. Patent left posterior communicating artery but otherwise occluded left PCA (series 13, image 25). Contralateral right PCA remarkable for moderate to severe stenosis in the P1 segment (series 12, image 24) which is new since October. Right PCA branches have not significantly changed. Anterior circulation: Both ICA siphons are patent. Mild left siphon calcified plaque without stenosis. Moderate right siphon calcified plaque, with mild to moderate proximal cavernous segment stenosis. Normal left posterior communicating artery origin. Patent carotid termini. Patent MCA and ACA origins with dominant right ACA A1 segment, stable. Anterior communicating artery and  bilateral ACA branches are within normal limits. Left MCA M1 segment, bifurcation, and left MCA branches are stable and within normal limits. Right MCA M1 segment, bifurcation, right MCA branches are stable and within normal limits. Venous sinuses: Early contrast timing. Patent superior sagittal sinus. Anatomic variants: Dominant left vertebral artery which primarily supplies the basilar. Dominant right ACA A1. Review of the MIP images confirms the above findings IMPRESSION: 1. Continued occlusion of the Left PCA, unchanged since October. 2. Positive for new moderate to severe Right PCA P1 segment stenosis since October. Right PCA branches remain within normal limits. 3. No other posterior circulation stenosis. Mild carotid atherosclerosis with no stenosis in the neck. Up to moderate right ICA siphon  stenosis in the cavernous segment. No other anterior circulation stenosis. Electronically Signed   By: Genevie Ann M.D.   On: 01/19/2020 18:59   MR BRAIN WO CONTRAST  Result Date: 01/19/2020 CLINICAL DATA:  69 year old male with dizziness, lightheaded since yesterday. Malaise. Min ocular vision loss. History of left PCA occlusion/infarct in October. EXAM: MRI HEAD WITHOUT CONTRAST TECHNIQUE: Multiplanar, multiecho pulse sequences of the brain and surrounding structures were obtained without intravenous contrast. COMPARISON:  Head CT without contrast earlier today. Brain MRI, MRA head and neck 11/15/2019. FINDINGS: Brain: Encephalomalacia developing in the left PCA territory, including fairly extensive involvement of the left mesial temporal lobe, inferior and medial left occiput. Associated hemosiderin. Laminar necrosis suspected. Superimposed small, patchy new foci of restricted diffusion also in the left PCA territory including the medial left periatrial white matter, dorsal left thalamus. No other restricted diffusion identified. No acute intracranial hemorrhage or mass effect. Early ex vacuo enlargement of the left  lateral ventricle. Stable appearance of chronic small vessel disease in the right basal ganglia, moderately scattered nonspecific bilateral cerebral white matter T2 and FLAIR hyperintensity. Brainstem and cerebellum remain within normal limits. Cervicomedullary junction and pituitary are within normal limits. Possible small right para falcine meningioma versus dural calcification measuring 7 mm (series 9, image 19). Vascular: Major intracranial vascular flow voids are stable. Skull and upper cervical spine: Negative for age visible cervical spine. Visualized bone marrow signal is within normal limits. Sinuses/Orbits: Normal suprasellar cistern, optic chiasm. Dark T2 thickening along the posterior retina both globes is unchanged since October. Otherwise negative orbits. Mild left frontoethmoidal sinus mucosal thickening has developed. Other: Stable trace right mastoid air cell fluid. Visible internal auditory structures appear normal. Normal stylomastoid foramina. Scalp and face soft tissues appear negative. IMPRESSION: 1. Several small foci of acute ischemia superimposed on the late subacute infarct of the Left PCA territory. Note Left PCA was occluded in October. No associated acute hemorrhage or mass effect. 2. Otherwise expected evolution of the large Left PCA infarct. Stable superimposed bilateral small vessel disease. 3. Subcentimeter right parafalcine meningioma versus dural calcification. Electronically Signed   By: Genevie Ann M.D.   On: 01/19/2020 18:14    Procedures Procedures (including critical care time)  Medications Ordered in ED Medications  sodium chloride 0.9 % bolus 500 mL (has no administration in time range)  iohexol (OMNIPAQUE) 350 MG/ML injection 100 mL (100 mLs Intravenous Contrast Given 01/19/20 1800)    ED Course  I have reviewed the triage vital signs and the nursing notes.  Pertinent labs & imaging results that were available during my care of the patient were reviewed by me and  considered in my medical decision making (see chart for details).    MDM Rules/Calculators/A&P                          Patient presents with worsening dizziness and lightheadedness.  He was alert, does not appear in acute distress, vital signs reassuring.  Due to concerning decrease left visual field with worsening lightheadedness and dizziness will send patient down for head CT for further evaluation.  Will consult with neurology due to new deficits since previous stroke.  Will obtain basic lab work-up.  Patient reassessed states change at this time, vital signs remained stable.  He denies worsening change in vision, headaches, paresthesias or weakness of her lower extremities.  Patient had negative orthostatics. CBC negative for leukocytosis or signs of anemia.  BMP shows no  electrolyte abnormalities, no metabolic acidosis, no AKI, no anion gap present.  UA negative for nitrates, leukocytes, signs of hematuria.  Initial troponin is 8.  Prothrombin time 14.1, INR 1.1.  CT head without contrast shows no evidence of an acute intracranial abnormality, redemonstration of a large chronic left PCA infarction, chronic small vessel ischemia, 11 mm partially calcified focus along mid falx likely meningioma. EKG sinus rhythm without signs of ischemia no ST elevation or depression noted.   Due to patient's increased risk factors for CVA with worsening lightheadedness and dizziness with left visual field deficit will consult with neurology for further recommendations.  Spoke with Dr. Leonel Ramsay of neurology who feels patient needs to have further evaluation recommends MRI brain as well as CT angio of head and neck for further evaluation.  MRI of brain shows several small foci of acute ischemia superimposed on the left subacute infarction of the left PCA no associated acute hemorrhage or mass noted.  CT angio neck and head reveal continued occlusion of the left PCA unchanged since October,  positive for new  moderate to severe right PCA P1 segment stenosis since October.  Right PCA branches remain within normal limits.  Due to new changes seen on MRI will consult with neurology for further recommendations.  Spoke with Dr. Theda Sers who states there is nothing that can be done for that type of ischemia.  Recommends continue with Plavix and an aspirin follow-up with neurology next 3 weeks.  Also recommends good BP controlled with goal greater than or equal to 140/90.    Low suspicion for ACS or arrhythmias as patient denies chest pain, shortness of breath, no hypoperfusion or fluid overload on exam, EKG sinus without signs of ischemia.  Initial troponin is 8, will defer second troponin at this time as he has been chest pain-free for over 12 hours.  Low suspicion for hypovolemia as patient is BP has remained in the 140s, orthostatic negative, no signs of dehydration on my exam.  Low suspicion for PE as patient denies shortness of breath, pleuritic chest pain, vital signs reassuring.  Low suspicion for systemic infection as patient is nontoxic-appearing, vital signs reassuring, no obvious source infection noted on exam.  Imaging shows small new small infarctions in the left PCA as well as some stenosis of the right PCA.  Suspect this may have caused his dizziness.  Due to his allergies to aspirin will recommend he continue with his Plavix and follow up with his neurologist on Monday for BP.  Management  Vital signs have remained stable, no indication for hospital admission.  Patient discussed with attending and they agreed with assessment and plan.  Patient given at home care as well strict return precautions.  Patient verbalized that they understood agreed to said plan.      Final Clinical Impression(s) / ED Diagnoses Final diagnoses:  Dizziness  Lightheadedness    Rx / DC Orders ED Discharge Orders    None       Aron Baba 01/19/20 2020    Carmin Muskrat, MD 01/23/20 2255

## 2020-01-19 NOTE — ED Notes (Signed)
Called CT to have pt's CT expedited.

## 2020-01-19 NOTE — Discharge Instructions (Signed)
Seen here for lightheadedness and dizziness.  Imaging shows new small infarctions within the left PCA territory Without acute hemorrhage or mass-effect.  Imaging also shows new moderate to severe right PCA P1 segment stenosis with right PCA branches remain within normal limits.  Recommend continue with your Plavix as prescribed.  I would like you to follow-up with your neurologist, please call them on Monday to obtain recommendations for blood pressure medications.  Come back to the emergency department if you develop chest pain, shortness of breath, severe abdominal pain, uncontrolled nausea, vomiting, diarrhea.

## 2020-01-19 NOTE — ED Notes (Signed)
Pt transported to CT at this time.

## 2020-01-21 DIAGNOSIS — R52 Pain, unspecified: Secondary | ICD-10-CM | POA: Diagnosis not present

## 2020-01-21 DIAGNOSIS — R1084 Generalized abdominal pain: Secondary | ICD-10-CM | POA: Diagnosis not present

## 2020-01-22 ENCOUNTER — Telehealth: Payer: Self-pay | Admitting: Neurology

## 2020-01-22 NOTE — Telephone Encounter (Signed)
Pt's wife called stating that the pt was in the hospital for dizziness and lightheadedness and they asked her to speak to the neurologist to see if the lisinopril (ZESTRIL) 5 MG tablet can be lowered to a lower dosage or if the medication can be changed completely. Please advise.

## 2020-01-22 NOTE — Telephone Encounter (Signed)
Please call patient, lisinopril 5 mg was started following his most recent office visit December 7, with documented blood pressure 162/97,  If he complains of dizziness due to hypotension reasons, it is okay to stop lisinopril  His goal blood pressure should be less than or equal to 130/80

## 2020-01-22 NOTE — Telephone Encounter (Signed)
I returned the call to the patient's significant other on DPR. States the medication has already been stopped. She verbalized understanding of the BP goals. She also called his PCP who instructed him to restart the lisinopril 5mg  but only taking 0.5 tablet daily. She is going to make a follow up appt with his PCP and is aware that future BP prescriptions/refills will need to be managed by that office.

## 2020-01-23 ENCOUNTER — Ambulatory Visit (INDEPENDENT_AMBULATORY_CARE_PROVIDER_SITE_OTHER): Payer: PPO | Admitting: Family Medicine

## 2020-01-23 ENCOUNTER — Ambulatory Visit: Payer: PPO | Admitting: *Deleted

## 2020-01-23 DIAGNOSIS — I6930 Unspecified sequelae of cerebral infarction: Secondary | ICD-10-CM

## 2020-01-23 MED ORDER — APIXABAN 5 MG PO TABS: 5.0000 mg | ORAL_TABLET | Freq: Two times a day (BID) | ORAL | 2 refills | Status: DC

## 2020-01-23 NOTE — Progress Notes (Signed)
Subjective:    Patient ID: Philip Stone, male    DOB: 06-25-1950, 69 y.o.   MRN: [id number]   HPI: Philip Stone is a 69 y.o. male presenting for feeling dizzy - lightheaded. Went to E.D. on 12/10. Had MRI that showed new areas of infarct -   Jacob Moores. Authorized to speak for pt. Concerned that it is worse after taking lisinopril. Home BP is okay.   Having trouble with balance and falling. Unable to  Walk. Went to E.D on 12/10 Report reviewed, including MRI. 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  Depression screen [facility] 2/9 12/25/2019 08/09/2019 07/17/2019 04/25/2019 07/08/2018  Decreased Interest 0 0 0 0 0  Down, Depressed, Hopeless 0 0 0 0 0  PHQ - 2 Score 0 0 0 0 0  Altered sleeping - - - - 0  Tired, decreased energy - - - - 0  Change in appetite - - - - 0  Feeling bad or failure about yourself  - - - - 0  Trouble concentrating - - - - 0  Moving slowly or fidgety/restless - - - - 0  Suicidal thoughts - - - - 0  PHQ-9 Score - - - - 0     Relevant past medical, surgical, family and social history reviewed and updated as indicated.  Interim medical history since our last visit reviewed. Allergies and medications reviewed and updated.  ROS:  Review of Systems  Constitutional: Negative for fever.  Respiratory: Negative for shortness of breath.   Cardiovascular: Negative for chest pain.  Musculoskeletal: Negative for arthralgias.  Skin: Negative for rash.  Neurological: Positive for weakness.  Psychiatric/Behavioral: Negative for agitation.     Social History   Tobacco Use  Smoking Status Former Smoker  . Packs/day: 0.50  . Years: 36.00  . Pack years: 18.00  . Types: Cigarettes  . Start date: 04/10/1989  . Quit date: 2008  . Years since quitting: 13.9  Smokeless Tobacco Never Used       Objective:     Wt Readings from Last 3 Encounters:  01/19/20 170 lb (77.1 kg)  12/25/19  179 lb (81.2 kg)  12/22/19 190 lb (86.2 kg)     Exam deferred. Pt. Harboring due to COVID 19. Phone visit performed.   Assessment & Plan:  No diagnosis found.  Meds ordered this encounter  Medications  . apixaban (ELIQUIS) 5 MG TABS tablet    Sig: Take 1 tablet (5 mg total) by mouth 2 (two) times daily.    Dispense:  60 tablet    Refill:  2    No orders of the defined types were placed in this encounter.     There are no diagnoses linked to this encounter.  Virtual Visit via telephone Note  I discussed the limitations, risks, security and privacy concerns of performing an evaluation and management service by telephone and the availability of in person appointments. The patient was identified with two identifiers. Pt.expressed understanding and agreed to proceed. Pt. Is at home. Dr. Livia Snellen is in his office.  Follow Up Instructions:   I discussed the assessment and treatment plan with the patient. The patient was provided an opportunity to ask questions and all were answered. The patient agreed with the plan and demonstrated an understanding of the instructions.   The patient was advised to call back or seek an in-person evaluation if the symptoms  worsen or if the condition fails to improve as anticipated.  PT. Should DC lisinopril. Will need to find a new BP med if BP gets above 150/90. Total minutes including chart review and phone contact time: 36   Follow up plan: Return in about 2 weeks (around 02/06/2020), or if symptoms worsen or fail to improve.  Claretta Fraise, MD [place]

## 2020-01-29 ENCOUNTER — Other Ambulatory Visit: Payer: Self-pay | Admitting: *Deleted

## 2020-01-29 ENCOUNTER — Telehealth: Payer: Self-pay

## 2020-01-29 DIAGNOSIS — I639 Cerebral infarction, unspecified: Secondary | ICD-10-CM

## 2020-01-29 MED ORDER — ATORVASTATIN CALCIUM 40 MG PO TABS: 40.0000 mg | ORAL_TABLET | Freq: Every day | ORAL | 0 refills | Status: DC

## 2020-01-29 NOTE — Telephone Encounter (Signed)
Take 2.5 mg BID of the Eloquis for now since he is feeling dizzy when it takes it. Try to bump back up to 5 mg BID after a couple weeks. 5 mg BID dose is ideal for stroke prevention. Follow up with PCP regarding scripts for upcoming move.

## 2020-01-29 NOTE — Telephone Encounter (Signed)
Covering PCP- please advise  

## 2020-01-29 NOTE — Telephone Encounter (Signed)
Pt aware of provider feedback and voiced understanding. 

## 2020-01-29 NOTE — Telephone Encounter (Signed)
Pt states that he was recently put on Eloquis, 0.5 mg because he had a stroke. Says the medicine is making him dizzy. Wants to know if he can take a lower dose.  Pt will be moving to [place] soon and wants to know if he can get paper scripts of his medicines to take with him to get refilled.

## 2020-01-31 ENCOUNTER — Encounter: Payer: Self-pay | Admitting: Family Medicine

## 2020-02-05 ENCOUNTER — Telehealth: Payer: Self-pay | Admitting: *Deleted

## 2020-02-05 NOTE — Telephone Encounter (Signed)
He should be taking Eliquis INSTEAD

## 2020-02-05 NOTE — Telephone Encounter (Signed)
Fax from [facility] Request for Clopidogrel 75 mg tab Not on current med list Last Ov 01/23/20

## 2020-02-05 NOTE — Telephone Encounter (Signed)
Patient aware and verbalizes understanding. 

## 2020-02-15 ENCOUNTER — Ambulatory Visit (INDEPENDENT_AMBULATORY_CARE_PROVIDER_SITE_OTHER): Payer: PPO | Admitting: Family Medicine

## 2020-02-15 ENCOUNTER — Other Ambulatory Visit: Payer: Self-pay

## 2020-02-15 ENCOUNTER — Encounter: Payer: Self-pay | Admitting: Family Medicine

## 2020-02-15 VITALS — BP 123/70 | HR 78 | Temp 98.0°F | Ht 70.0 in | Wt 171.5 lb

## 2020-02-15 DIAGNOSIS — I6930 Unspecified sequelae of cerebral infarction: Secondary | ICD-10-CM | POA: Diagnosis not present

## 2020-02-15 DIAGNOSIS — I69351 Hemiplegia and hemiparesis following cerebral infarction affecting right dominant side: Secondary | ICD-10-CM

## 2020-02-15 DIAGNOSIS — I639 Cerebral infarction, unspecified: Secondary | ICD-10-CM

## 2020-02-15 MED ORDER — ATORVASTATIN CALCIUM 40 MG PO TABS: 40.0000 mg | ORAL_TABLET | Freq: Every day | ORAL | 1 refills | Status: AC

## 2020-02-15 MED ORDER — APIXABAN 5 MG PO TABS
5.0000 mg | ORAL_TABLET | Freq: Two times a day (BID) | ORAL | 1 refills | Status: AC
Start: 2020-02-15 — End: ?

## 2020-02-15 MED ORDER — TAMSULOSIN HCL 0.4 MG PO CAPS: 0.8000 mg | ORAL_CAPSULE | Freq: Every day | ORAL | 1 refills | Status: DC

## 2020-02-15 MED ORDER — PANTOPRAZOLE SODIUM 40 MG PO TBEC: 40.0000 mg | DELAYED_RELEASE_TABLET | Freq: Every day | ORAL | 1 refills | Status: AC

## 2020-02-15 NOTE — Progress Notes (Signed)
Subjective:  Patient ID: Philip Stone, male    DOB: [birth year]  Age: 70 y.o. MRN: [id number]  CC: Follow-up (Medication refill /)   HPI Philip Stone presents for follow-up from his recent stroke.  He is doing rehab exercises at home and making progress.  He still has weakness of the right side including a stiffness of his right hand that seems to get worse when he exercises it.  He has arranged to move in with his sister in the state of [place].  She has a friend who is a Firefighter who specializes in stroke therapy.  This nurse is willing to work with him on a regular, daily basis and apparently moving into the home with he and his sister.  As result he is needing a week from tomorrow.  Today he just needs his medications prescribed for 90 days so he can find a new physician and the area where he is moving.  He is walking some with a walker.  He is in good spirits and feels that he can make great progress.  He is using apixaban and atorvastatin for prevention of further stroke.  He is not having any reflux symptoms as long as he takes his pantoprazole.  His BPH is stable with the current dose of tamsulosin.  Depression screen [facility] 2/9 02/15/2020 12/25/2019 08/09/2019  Decreased Interest 0 0 0  Down, Depressed, Hopeless 0 0 0  PHQ - 2 Score 0 0 0  Altered sleeping - - -  Tired, decreased energy - - -  Change in appetite - - -  Feeling bad or failure about yourself  - - -  Trouble concentrating - - -  Moving slowly or fidgety/restless - - -  Suicidal thoughts - - -  PHQ-9 Score - - -    History Yehoshua has a past medical history of Arthritis, BPH (benign prostatic hyperplasia), Charcot-Marie-Tooth disease, Foot drop, right, Hard of hearing, Neuropathy, Platelets decreased (HCC), Spinal stenosis, and Stroke (HCC).   He has a past surgical history that includes Cervical disc surgery (10 YEARS AGO); Knee arthroscopy (Right); Foot arthroplasty; Inguinal hernia repair (Right, 06/15/2014); Insertion  of mesh (Right, 06/15/2014); and Total knee arthroplasty (Right, 01/21/2018).   His family history includes Other in his father; Suicidality in his mother.He reports that he quit smoking about 14 years ago. His smoking use included cigarettes. He started smoking about 30 years ago. He has a 18.00 pack-year smoking history. He has never used smokeless tobacco. He reports that he does not drink alcohol and does not use drugs.    ROS Review of Systems  Constitutional: Negative for fever.  Respiratory: Negative for shortness of breath.   Cardiovascular: Negative for chest pain.  Musculoskeletal: Positive for gait problem (Weakness of the right lower extremity). Negative for arthralgias.  Skin: Negative for rash.  Neurological: Positive for weakness (Right upper and lower extremity).    Objective:  BP 123/70   Pulse 78   Temp 98 F (36.7 C) (Temporal)   Ht 5\' 10"  (1.778 m)   Wt 171 lb 8 oz (77.8 kg)   BMI 24.61 kg/m   BP Readings from Last 3 Encounters:  02/15/20 123/70  01/19/20 (!) 141/106  01/16/20 (!) 162/94    Wt Readings from Last 3 Encounters:  02/15/20 171 lb 8 oz (77.8 kg)  01/19/20 170 lb (77.1 kg)  12/25/19 179 lb (81.2 kg)     Physical Exam Vitals reviewed.  Constitutional:  Appearance: He is well-developed and well-nourished.  HENT:     Head: Normocephalic and atraumatic.     Right Ear: Tympanic membrane and external ear normal. No decreased hearing noted.     Left Ear: Tympanic membrane and external ear normal. No decreased hearing noted.     Mouth/Throat:     Pharynx: No oropharyngeal exudate or posterior oropharyngeal erythema.  Eyes:     Pupils: Pupils are equal, round, and reactive to light.  Cardiovascular:     Rate and Rhythm: Normal rate and regular rhythm.     Heart sounds: No murmur heard.   Pulmonary:     Effort: No respiratory distress.     Breath sounds: Normal breath sounds.  Abdominal:     General: Bowel sounds are normal.      Palpations: Abdomen is soft. There is no mass.     Tenderness: There is no abdominal tenderness.  Musculoskeletal:        General: Deformity (There is weakness of the right upper and lower extremity with turning inward of the right foot with ambulation) present.     Cervical back: Normal range of motion and neck supple.       Assessment & Plan:   Saba was seen today for follow-up.  Diagnoses and all orders for this visit:  Hemiparesis affecting right side as late effect of cerebrovascular accident (CVA) ([place])  Acute CVA (cerebrovascular accident) ([place]) -     atorvastatin (LIPITOR) 40 MG tablet; Take 1 tablet (40 mg total) by mouth daily.  Other orders -     apixaban (ELIQUIS) 5 MG TABS tablet; Take 1 tablet (5 mg total) by mouth 2 (two) times daily. -     pantoprazole (PROTONIX) 40 MG tablet; Take 1 tablet (40 mg total) by mouth daily. -     tamsulosin (FLOMAX) 0.4 MG CAPS capsule; Take 2 capsules (0.8 mg total) by mouth daily after supper.       I have changed Courtney Heys atorvastatin. I am also having him maintain his acetaminophen, apixaban, pantoprazole, and tamsulosin.  Allergies as of 02/15/2020      Reactions   Asa [aspirin] Anaphylaxis   Prednisone    Makes him lightheaded      Medication List       Accurate as of February 15, 2020  2:19 PM. If you have any questions, ask your nurse or doctor.        acetaminophen 500 MG tablet Commonly known as: TYLENOL Take 1 tablet (500 mg total) by mouth every 6 (six) hours as needed. What changed: reasons to take this   apixaban 5 MG Tabs tablet Commonly known as: Eliquis Take 1 tablet (5 mg total) by mouth 2 (two) times daily.   atorvastatin 40 MG tablet Commonly known as: LIPITOR Take 1 tablet (40 mg total) by mouth daily. What changed: additional instructions Changed by: Claretta Fraise, MD   pantoprazole 40 MG tablet Commonly known as: PROTONIX Take 1 tablet (40 mg total) by mouth daily.   tamsulosin  0.4 MG Caps capsule Commonly known as: FLOMAX Take 2 capsules (0.8 mg total) by mouth daily after supper.        Follow-up: No follow-ups on file.  Claretta Fraise, M.D.

## 2020-03-01 ENCOUNTER — Telehealth: Payer: Self-pay

## 2020-03-01 NOTE — Telephone Encounter (Signed)
FYI for documentation: Pt called to confirm that paperwork had been signed and sent back to [employer].  Per Tye [place], paperwork was faxed back to [employer] this morning.

## 2020-03-13 ENCOUNTER — Telehealth: Payer: Self-pay

## 2020-04-02 ENCOUNTER — Other Ambulatory Visit: Payer: Self-pay | Admitting: Family Medicine

## 2020-05-06 ENCOUNTER — Other Ambulatory Visit: Payer: Self-pay | Admitting: Family Medicine

## 2020-05-07 ENCOUNTER — Other Ambulatory Visit: Payer: Self-pay | Admitting: Family Medicine

## 2020-05-14 ENCOUNTER — Telehealth: Payer: Self-pay

## 2020-05-14 NOTE — Telephone Encounter (Signed)
Patient's sister called and wants you to return call in morning.  She is not on patient HIPPA form.  Her message stated she needed to talk to you because "2021" is still on the paperwork.

## 2020-05-15 NOTE — Telephone Encounter (Signed)
VM on extension was name of someone else names Philip Stone. I have left a message with [employer] again to get clarification as to what was incorrect again. Last month question 2 of page 3 on leave form was updated & faxed back Today I pulled my copy again, the only thing I can see is that possibly on the employment verification form the beginning date of leave had a light light through it, so I crossed this out in heavier ink as well as went over the correct date & my initials & date of when I corrected this so that it was bolder, then faxed it back to [place].

## 2020-05-16 NOTE — Telephone Encounter (Signed)
Philip Stone aware of what I did yesterday in trying to update this information for her & she will follow-up with [employer] in a couple of days

## 2020-07-16 ENCOUNTER — Ambulatory Visit: Payer: Medicare Other | Admitting: Neurology

## 2021-09-18 ENCOUNTER — Ambulatory Visit: Payer: Self-pay | Admitting: *Deleted

## 2021-09-18 NOTE — Chronic Care Management (AMB) (Signed)
  Chronic Care Management   Note  09/18/2021 Name: Philip Stone MRN: [id number] DOB: 1950-03-19   Due to changes in the Chronic Care Management program, I am removing myself as the [place] from the Care Team and closing any RN Care Management Care Plans. The patient has not worked with the Consulting civil engineer within the past 6 months. Patient was not scheduled to be followed by the RN Care Coordination nurse for [facility].   Patient does not have an open Care Plan with another CCM team member. Patient does not have a current CCM referral placed since 06/09/21. CCM enrollment status changed to "not enrolled".   Patient's PCP can place a new referral if the they needs Care Management or Care Coordination services in the future.  Chong Sicilian, BSN, RN-BC Proofreader Dial: [phone]

## 2021-09-18 NOTE — Patient Instructions (Signed)
Philip Stone  At some point during the past 4 years, I have worked with you through the [facility] Program (CCM) at [facility]. We have not worked together within the past 6 months.   Due to program changes I am removing myself from your care team.   If you are currently active with another CCM Team Member, you will remain active with them unless they reach out to you with additional information.   If you feel that you need services in the future,  please talk with your primary care provider and request a new referral for Care Management or Care Coordination services. This does not affect your status as a patient at [facility].   Thank you for allowing me to participate in your your healthcare journey.  Chong Sicilian, BSN, RN-BC Proofreader Dial: [phone]

## 2021-11-21 ENCOUNTER — Telehealth: Payer: Self-pay

## 2021-11-21 NOTE — Telephone Encounter (Signed)
TOC call not performed as patient no longer see our office as PCP - patient no longer lives in this state.

## 2022-03-12 IMAGING — MR MR MRA NECK WO/W CM
4 series · 35 of 48 positions shown · IV contrast (7ML Gadavist)
Comparison: Recent MRI/MRA from 11/14/2019.

CLINICAL DATA: Initial evaluation for acute neuro deficit, stroke
suspected.

EXAM:
MRI HEAD WITHOUT CONTRAST
MRA HEAD WITHOUT CONTRAST
MRA NECK WITHOUT AND WITH CONTRAST
TECHNIQUE: Multiplanar, multiecho pulse sequences of the brain and surrounding
structures were obtained without intravenous contrast. Angiographic
images of the Circle of Willis were obtained using MRA technique
without intravenous contrast. Angiographic images of the neck were
obtained using MRA technique without and with intravenous contrast.
Carotid stenosis measurements (when applicable) are obtained
utilizing NASCET criteria, using the distal internal carotid
diameter as the denominator.
CONTRAST:  7mL GADAVIST GADOBUTROL 1 MMOL/ML IV SOLN

[Series 7: tof_fl3d_tra_iso · axial · 0.6mm · 0.52mm/px · z∈[-134,-61]mm · 11 of 133 slices shown]
[im 1/133]
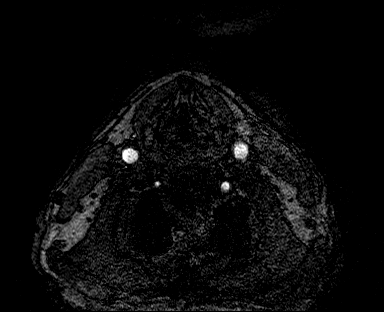
[im 8/133]
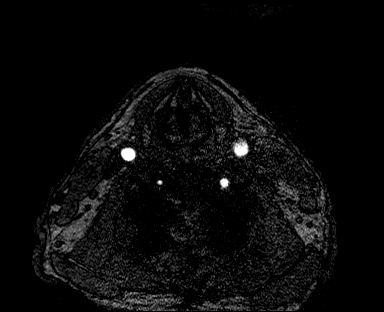
[im 24/133]
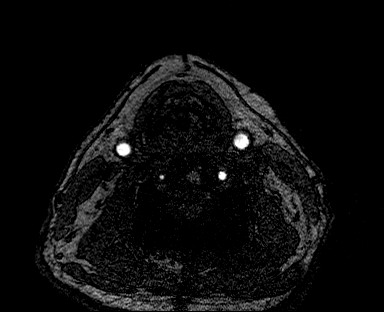
[im 39/133]
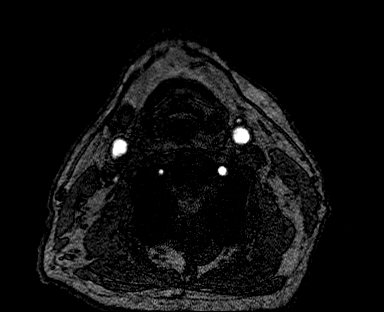
[im 55/133]
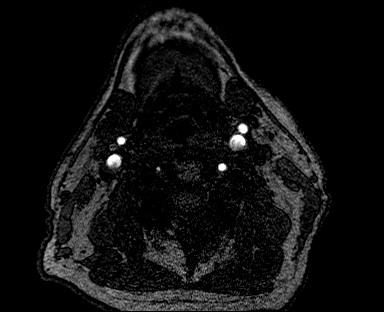
[im 70/133]
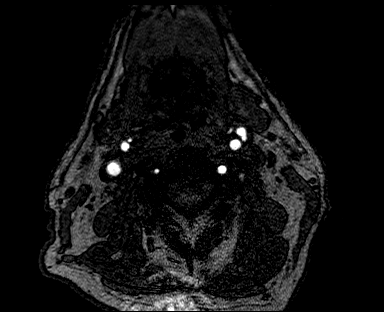
[im 78/133]
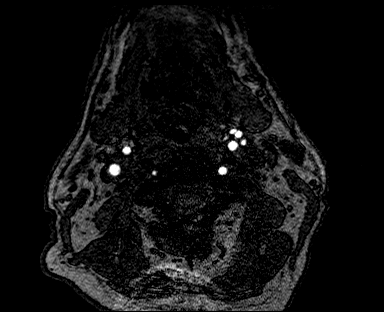
[im 94/133]
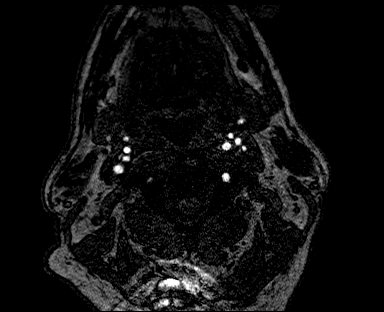
[im 109/133]
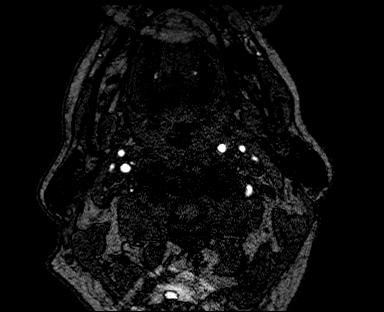
[im 117/133]
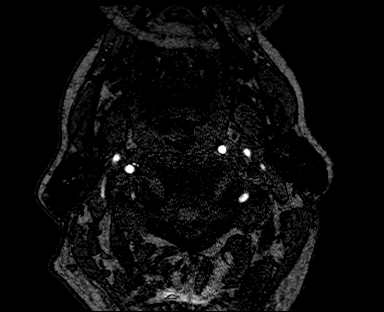
[im 125/133]
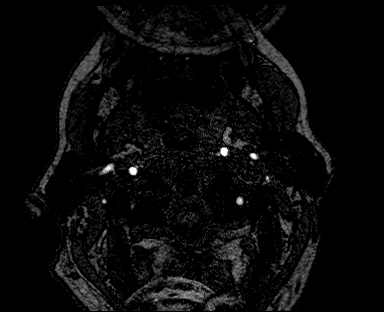

[Series 21: angio_fl3d_cor_pre_ttc=3.0s · coronal · 0.9mm · 0.85mm/px · 8 of 80 slices shown]
[im 1/80]
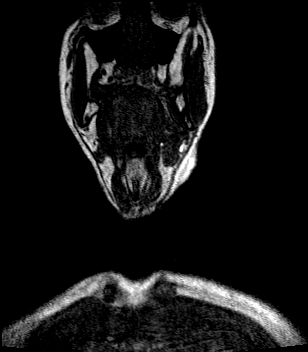
[im 9/80]
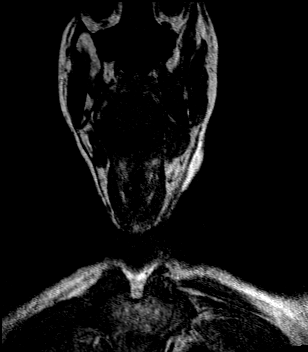
[im 27/80]
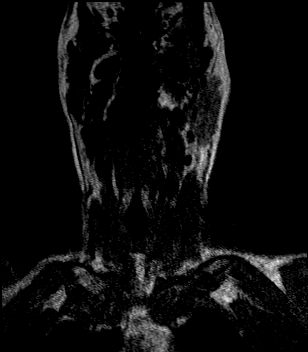
[im 36/80]
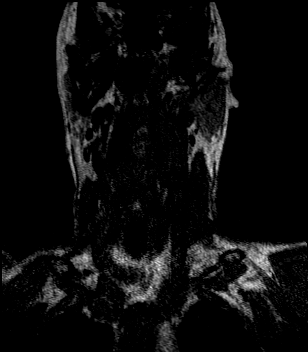
[im 44/80]
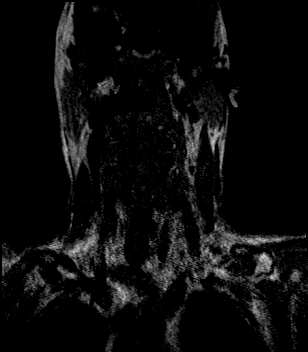
[im 53/80]
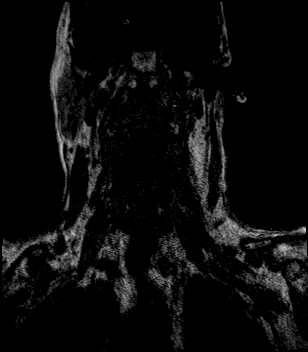
[im 71/80]
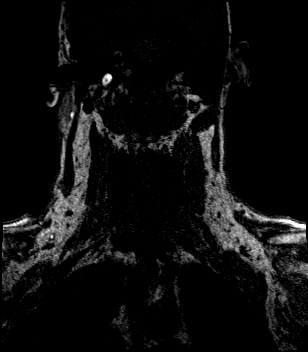
[im 80/80]
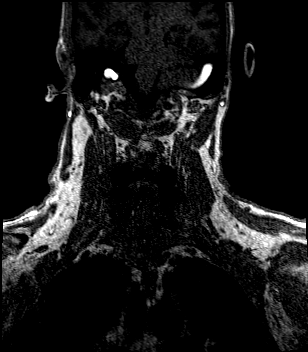

[Series 23: angio_fl3d_cor_post_ttc=3.0s · coronal · 0.9mm · 0.85mm/px · 8 of 80 slices shown]
[im 1/80]
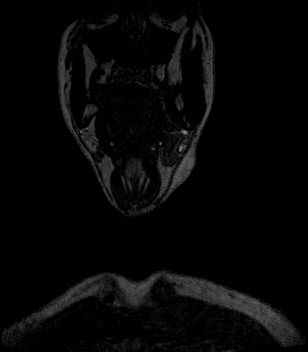
[im 9/80]
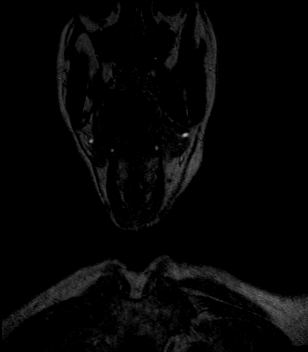
[im 27/80]
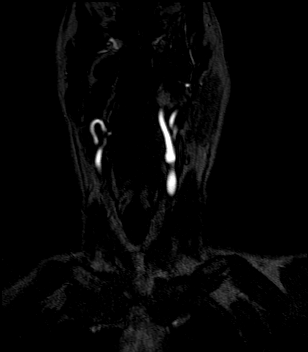
[im 36/80]
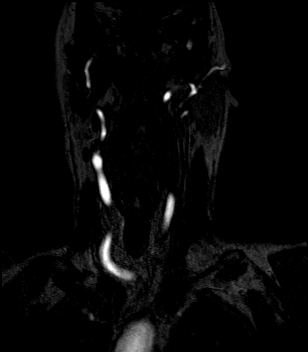
[im 44/80]
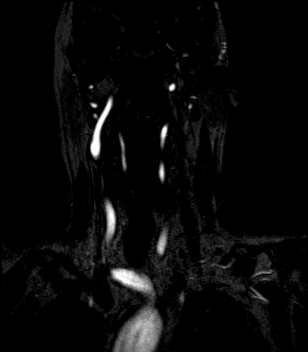
[im 53/80]
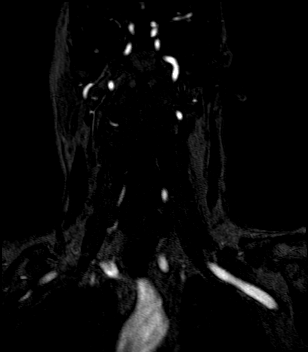
[im 71/80]
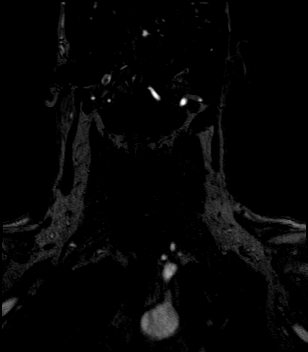
[im 80/80]
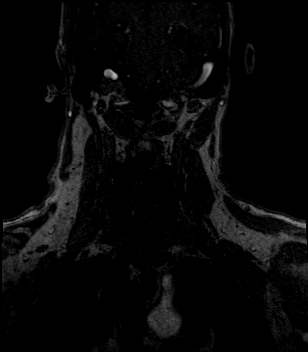

[Series 1004: results sub_subtraction · coronal · 0.9mm · 0.85mm/px · 8 of 80 slices shown]
[im 1/80]
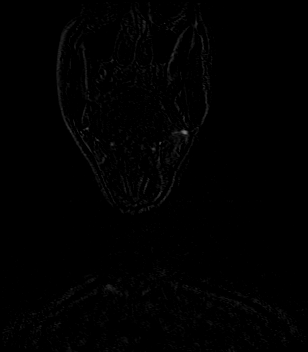
[im 9/80]
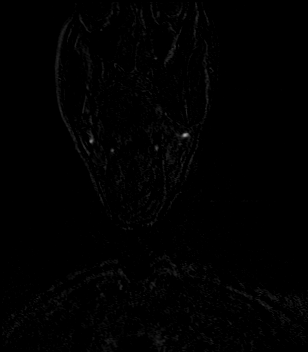
[im 27/80]
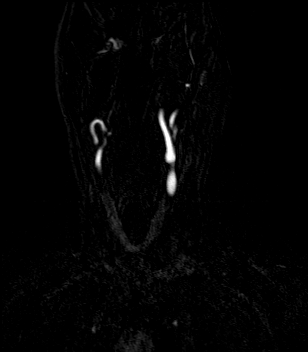
[im 36/80]
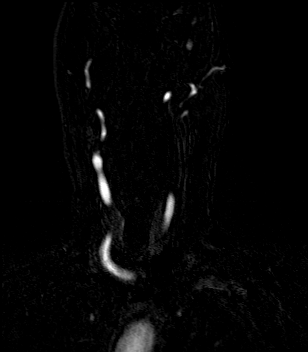
[im 44/80]
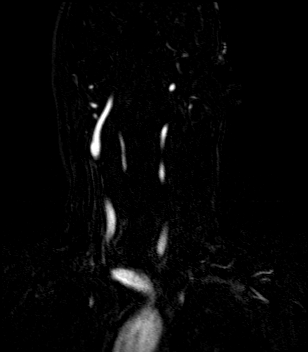
[im 53/80]
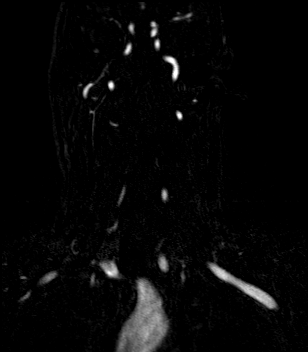
[im 71/80]
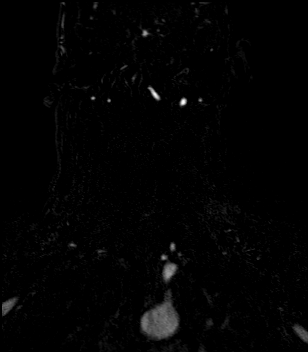
[im 80/80]
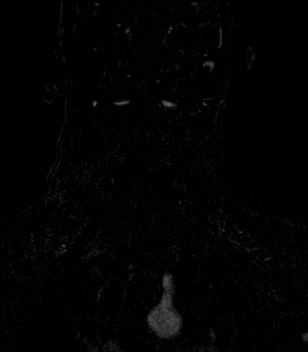

[35 of 48 positions shown; findings below may reference images not displayed]

FINDINGS: MRI HEAD FINDINGS

Brain: There has been interval development of patchy restricted
diffusion involving the parasagittal left temporal occipital region,
compatible with an acute left PCA territory infarct, overall
moderate in size. Involvement is greatest at the mesial left
temporal lobe/left hippocampal formation. Mild patchy involvement of
the left thalamus noted as well. Additional punctate cortical
infarct noted superiorly at the paramedian left parietal lobe
(series 5, image thirty-five). No associated hemorrhage or mass
effect.

No other evidence for acute or interval infarction. Gray-white
matter differentiation otherwise maintained. No susceptibility
artifact to suggest acute or chronic intracranial hemorrhage.

Underlying atrophy with moderate chronic microvascular ischemic
disease again noted. No mass lesion, mass effect, or midline shift.
No hydrocephalus or extra-axial fluid collection. Pituitary gland
and suprasellar region normal. Midline structures intact.

Vascular: Major intracranial vascular flow voids are maintained.

Skull and upper cervical spine: Craniocervical junction within
normal limits. Postsurgical changes partially visualize within the
upper cervical spine. Bone marrow signal intensity normal. No scalp
soft tissue abnormality.

Sinuses/Orbits: Subtle flattening with T2 hypointense thickening
noted at the posterolateral aspects of the globes bilaterally, right
slightly greater than left (series 10, image 8), of uncertain
significance. Globes and orbital soft tissues otherwise
unremarkable. Scattered mucosal thickening noted within the
frontoethmoidal sinuses. Paranasal sinuses are otherwise clear. No
significant mastoid effusion. Inner ear structures grossly normal.

Other: None.

MRA HEAD FINDINGS

ANTERIOR CIRCULATION:

Distal cervical segments of the internal carotid arteries are widely
patent with antegrade flow. Mild atheromatous irregularity
throughout the carotid siphons without hemodynamically significant
stenosis or other abnormality. A1 segments, anterior communicating
artery complex, and anterior cerebral arteries widely patent and
within normal limits. No M1 stenosis or occlusion. Distal MCA
branches well perfused and symmetric.

POSTERIOR CIRCULATION:

Vertebral arteries widely patent to the vertebrobasilar junction.
Left vertebral artery strongly dominant, with a diffusely
hypoplastic right vertebral artery. Both picas patent. Basilar
widely patent to its distal aspect. Superior cerebral arteries
patent bilaterally. Right PCA supplied via the basilar remains
widely patent. Interval occlusion of the left PCA since previous
exam. Some attenuated flow is seen within the left posterior
communicating artery.

No intracranial aneurysm.

MRA NECK FINDINGS

AORTIC ARCH: Visualized aortic arch of normal caliber. Bovine
branching pattern with common origin of the innominate and left
common carotid arteries noted. No hemodynamically significant
stenosis seen about the origin of the great vessels.

RIGHT CAROTID SYSTEM: Right CCA widely patent from its origin to the
bifurcation without stenosis. No significant atheromatous narrowing
about the right bifurcation. Right ICA widely patent to the skull
base without stenosis or other abnormality.

LEFT CAROTID SYSTEM: Left CCA widely patent from its origin to the
bifurcation without stenosis. No more than mild short-segment 20%
stenosis at the origin of the left ICA. Left ICA otherwise widely
patent distally without stenosis or other vascular abnormality.

VERTEBRAL ARTERIES: Both vertebral arteries arise from subclavian
arteries. No proximal subclavian artery stenosis. Left vertebral
artery strongly dominant, with a diffusely hypoplastic right
vertebral artery. Both vertebral arteries widely patent within the
neck without stenosis or occlusion.
IMPRESSION: MRI HEAD IMPRESSION:

1. Interval development of moderate-sized acute ischemic
nonhemorrhagic left PCA territory infarct involving the parasagittal
left temporal occipital region as well as the left thalamus. No
associated mass effect.
2. Underlying atrophy with moderate chronic microvascular ischemic
disease.
3. Subtle flattening with T2 hypointense thickening at the
posterolateral aspects of the globes bilaterally, of uncertain
significance. Correlation with history and funduscopic exam
suggested.

MRA HEAD IMPRESSION:

1. Interval occlusion of the left PCA since previous exam.
2. Otherwise stable intracranial MRA. No other large vessel
occlusion. No other hemodynamically significant or correctable
stenosis.

MRA NECK IMPRESSION:

1. No more than mild short-segment stenosis at the origin of the
left ICA. Otherwise wide patency of both carotid artery systems
within the neck.
2. Both vertebral arteries widely patent within the neck. Left
vertebral artery strongly dominant, with a diffusely hypoplastic
right vertebral artery.

## 2022-07-11 DEATH — deceased
